# Patient Record
Sex: Female | Born: 1940 | Race: White | Hispanic: No
Health system: Midwestern US, Community
[De-identification: ages and names within clinical notes are randomized; demographics above are authoritative.]

## PROBLEM LIST (undated history)

## (undated) DIAGNOSIS — S83242A Other tear of medial meniscus, current injury, left knee, initial encounter: Secondary | ICD-10-CM

## (undated) DIAGNOSIS — C50919 Malignant neoplasm of unspecified site of unspecified female breast: Secondary | ICD-10-CM

## (undated) DIAGNOSIS — F419 Anxiety disorder, unspecified: Secondary | ICD-10-CM

## (undated) DIAGNOSIS — M8580 Other specified disorders of bone density and structure, unspecified site: Secondary | ICD-10-CM

## (undated) DIAGNOSIS — G4700 Insomnia, unspecified: Secondary | ICD-10-CM

## (undated) DIAGNOSIS — Z923 Personal history of irradiation: Secondary | ICD-10-CM

## (undated) DIAGNOSIS — E785 Hyperlipidemia, unspecified: Secondary | ICD-10-CM

## (undated) DIAGNOSIS — H269 Unspecified cataract: Secondary | ICD-10-CM

## (undated) DIAGNOSIS — I82409 Acute embolism and thrombosis of unspecified deep veins of unspecified lower extremity: Secondary | ICD-10-CM

## (undated) DIAGNOSIS — N3281 Overactive bladder: Secondary | ICD-10-CM

## (undated) DIAGNOSIS — B029 Zoster without complications: Secondary | ICD-10-CM

## (undated) DIAGNOSIS — K219 Gastro-esophageal reflux disease without esophagitis: Secondary | ICD-10-CM

## (undated) DIAGNOSIS — E079 Disorder of thyroid, unspecified: Secondary | ICD-10-CM

## (undated) HISTORY — PX: KNEE ARTHROSCOPY: SUR90

## (undated) HISTORY — DX: Insomnia, unspecified: G47.00

## (undated) HISTORY — DX: Gastro-esophageal reflux disease without esophagitis: K21.9

## (undated) HISTORY — DX: Unspecified cataract: H26.9

## (undated) HISTORY — DX: Anxiety disorder, unspecified: F41.9

## (undated) HISTORY — DX: Other specified disorders of bone density and structure, unspecified site: M85.80

## (undated) HISTORY — DX: Overactive bladder: N32.81

## (undated) HISTORY — DX: Zoster without complications: B02.9

## (undated) HISTORY — DX: Acute embolism and thrombosis of unspecified deep veins of unspecified lower extremity: I82.409

## (undated) HISTORY — DX: Disorder of thyroid, unspecified: E07.9

## (undated) HISTORY — DX: Malignant neoplasm of unspecified site of unspecified female breast: C50.919

## (undated) HISTORY — PX: CATARACT EXTRACTION W/ INTRAOCULAR LENS IMPLANT: SHX1309

## (undated) HISTORY — DX: Hyperlipidemia, unspecified: E78.5

## (undated) HISTORY — PX: LASIK: SHX215

---

## 1983-08-28 HISTORY — PX: REDUCTION MAMMAPLASTY: SUR839

## 2000-08-27 HISTORY — PX: VAGINAL HYSTERECTOMY: SUR661

## 2011-08-28 HISTORY — PX: BREAST LUMPECTOMY: SHX2

## 2012-06-27 DIAGNOSIS — C50919 Malignant neoplasm of unspecified site of unspecified female breast: Secondary | ICD-10-CM

## 2012-06-27 HISTORY — DX: Malignant neoplasm of unspecified site of unspecified female breast: C50.919

## 2012-08-27 HISTORY — PX: COLONOSCOPY: SHX174

## 2012-08-27 HISTORY — PX: BREAST LUMPECTOMY: SHX2

## 2012-08-27 LAB — HM COLONOSCOPY

## 2014-08-27 LAB — HM DEXA SCAN

## 2015-05-30 ENCOUNTER — Inpatient Hospital Stay: Admit: 2015-05-30 | Payer: MEDICARE | Primary: Internal Medicine

## 2015-05-30 NOTE — Other (Signed)
ORDERS NOT YET RECEIVED, Chart marked and placed in chart room for charge RN to follow up.

## 2015-05-30 NOTE — Other (Addendum)
Patient verified name, DOB, and surgery as listed in [facility].    TYPE  CASE:1b            Orders:NOT YET received, CALLED AND REQUESTED VIA VOICEMAIL FROM LINDA AT [facility] OFFICE.  Labs per surgeon:  NO ORDERS AT THIS TIME   Labs per anesthesia protocol: None needed   EKG  :  None needed     Pt medications obtained  PT MEMORY, med bottles NOT visualized. Requested pt to validate each medication, dose and frequency while here today.    Instructed patient to continue previous medications as prescribed prior to surgery and  to take the following medications the day of surgery according to anesthesia guidelines with a small sip of water : ATIVAN IF NEEDED, OMEPRAZOLE, TAMOXIFEN, TRAMADOL IF NEEDED        Continue all previous medications unless otherwise directed.     Instructed patient to hold  the following medications prior to surgery: ALL VITS, DICLOFENAC    Patient instructed on the following and verbalized understanding:  Arrive at [facility] entrance, time of arrival to be called the day before by 1700.  Responsible adult must drive patient to and from hospital, stay during surgery and 24 hours postoperatively.  Npo after midnight including gum, mints and ice chips.  Use hibiclens in the shower the night before and the morning of surgery.  Leave all valuables at home. Instructed on no jewelry or body piercings on the dos.  Bring insurance card and ID.  No perfumes, oil, powder, colognes, makeup or  lotions on the skin.    Patient verbalized understanding of all instructions and provided all medical/health information to the best of their ability.

## 2015-06-03 ENCOUNTER — Inpatient Hospital Stay: Payer: MEDICARE

## 2015-06-03 MED ORDER — ROPIVACAINE (PF) 5 MG/ML (0.5 %) INJECTION
5 mg/mL (0. %) | INTRAMUSCULAR | Status: AC
Start: 2015-06-03 — End: ?

## 2015-06-03 MED ORDER — SODIUM CHLORIDE 0.9 % INJECTION
INTRAMUSCULAR | Status: AC
Start: 2015-06-03 — End: ?

## 2015-06-03 MED ORDER — LACTATED RINGERS IV
INTRAVENOUS | Status: DC
Start: 2015-06-03 — End: 2015-06-03
  Administered 2015-06-03: 14:00:00 via INTRAVENOUS

## 2015-06-03 MED ORDER — PROPOFOL 10 MG/ML IV EMUL
10 mg/mL | INTRAVENOUS | Status: DC | PRN
Start: 2015-06-03 — End: 2015-06-03
  Administered 2015-06-03: 15:00:00 via INTRAVENOUS

## 2015-06-03 MED ORDER — DEXAMETHASONE SODIUM PHOSPHATE 4 MG/ML IJ SOLN
4 mg/mL | INTRAMUSCULAR | Status: AC
Start: 2015-06-03 — End: ?

## 2015-06-03 MED ORDER — ONDANSETRON (PF) 4 MG/2 ML INJECTION
4 mg/2 mL | INTRAMUSCULAR | Status: DC | PRN
Start: 2015-06-03 — End: 2015-06-03
  Administered 2015-06-03: 16:00:00 via INTRAVENOUS

## 2015-06-03 MED ORDER — EPHEDRINE SULFATE 50 MG/ML IJ SOLN
50 mg/mL | INTRAMUSCULAR | Status: AC
Start: 2015-06-03 — End: ?

## 2015-06-03 MED ORDER — LIDOCAINE HCL 1 % (10 MG/ML) IJ SOLN
10 mg/mL (1 %) | INTRAMUSCULAR | Status: DC | PRN
Start: 2015-06-03 — End: 2015-06-03

## 2015-06-03 MED ORDER — FENTANYL CITRATE (PF) 50 MCG/ML IJ SOLN
50 mcg/mL | INTRAMUSCULAR | Status: AC
Start: 2015-06-03 — End: ?

## 2015-06-03 MED ORDER — ONDANSETRON (PF) 4 MG/2 ML INJECTION
4 mg/2 mL | INTRAMUSCULAR | Status: AC
Start: 2015-06-03 — End: ?

## 2015-06-03 MED ORDER — LIDOCAINE (PF) 20 MG/ML (2 %) IJ SOLN
20 mg/mL (2 %) | INTRAMUSCULAR | Status: DC | PRN
Start: 2015-06-03 — End: 2015-06-03
  Administered 2015-06-03: 15:00:00 via INTRAVENOUS

## 2015-06-03 MED ORDER — FAMOTIDINE 20 MG TAB
20 mg | Freq: Once | ORAL | Status: DC
Start: 2015-06-03 — End: 2015-06-03

## 2015-06-03 MED ORDER — FENTANYL CITRATE (PF) 50 MCG/ML IJ SOLN
50 mcg/mL | INTRAMUSCULAR | Status: DC | PRN
Start: 2015-06-03 — End: 2015-06-03
  Administered 2015-06-03 (×3): via INTRAVENOUS

## 2015-06-03 MED ORDER — LIDOCAINE (PF) 20 MG/ML (2 %) IV SYRINGE
100 mg/5 mL (2 %) | INTRAVENOUS | Status: AC
Start: 2015-06-03 — End: ?

## 2015-06-03 MED ORDER — MIDAZOLAM 1 MG/ML IJ SOLN
1 mg/mL | Freq: Once | INTRAMUSCULAR | Status: DC | PRN
Start: 2015-06-03 — End: 2015-06-03

## 2015-06-03 MED ORDER — DEXAMETHASONE SODIUM PHOSPHATE 4 MG/ML IJ SOLN
4 mg/mL | INTRAMUSCULAR | Status: DC | PRN
Start: 2015-06-03 — End: 2015-06-03
  Administered 2015-06-03: 16:00:00 via INTRAVENOUS

## 2015-06-03 MED ORDER — ROPIVACAINE (PF) 5 MG/ML (0.5 %) INJECTION
5 mg/mL (0. %) | INTRAMUSCULAR | Status: DC | PRN
Start: 2015-06-03 — End: 2015-06-03
  Administered 2015-06-03: 16:00:00 via INTRA_ARTICULAR

## 2015-06-03 MED ORDER — PROPOFOL 10 MG/ML IV EMUL
10 mg/mL | INTRAVENOUS | Status: AC
Start: 2015-06-03 — End: ?

## 2015-06-03 MED FILL — FAMOTIDINE 20 MG TAB: 20 mg | ORAL | Qty: 1

## 2015-06-03 MED FILL — LIDOCAINE (PF) 20 MG/ML (2 %) IJ SOLN: 20 mg/mL (2 %) | INTRAMUSCULAR | Qty: 60

## 2015-06-03 MED FILL — PROPOFOL 10 MG/ML IV EMUL: 10 mg/mL | INTRAVENOUS | Qty: 200

## 2015-06-03 MED FILL — ONDANSETRON (PF) 4 MG/2 ML INJECTION: 4 mg/2 mL | INTRAMUSCULAR | Qty: 4

## 2015-06-03 MED FILL — EPHEDRINE SULFATE 50 MG/ML IJ SOLN: 50 mg/mL | INTRAMUSCULAR | Qty: 1

## 2015-06-03 MED FILL — DEXAMETHASONE SODIUM PHOSPHATE 4 MG/ML IJ SOLN: 4 mg/mL | INTRAMUSCULAR | Qty: 1

## 2015-06-03 MED FILL — SODIUM CHLORIDE 0.9 % INJECTION: INTRAMUSCULAR | Qty: 20

## 2015-06-03 MED FILL — PROPOFOL 10 MG/ML IV EMUL: 10 mg/mL | INTRAVENOUS | Qty: 20

## 2015-06-03 MED FILL — ONDANSETRON (PF) 4 MG/2 ML INJECTION: 4 mg/2 mL | INTRAMUSCULAR | Qty: 2

## 2015-06-03 MED FILL — NAROPIN (PF) 5 MG/ML (0.5 %) INJECTION SOLUTION: 5 mg/mL (0. %) | INTRAMUSCULAR | Qty: 30

## 2015-06-03 MED FILL — FENTANYL CITRATE (PF) 50 MCG/ML IJ SOLN: 50 mcg/mL | INTRAMUSCULAR | Qty: 2

## 2015-06-03 MED FILL — DEXAMETHASONE SODIUM PHOSPHATE 4 MG/ML IJ SOLN: 4 mg/mL | INTRAMUSCULAR | Qty: 4

## 2015-06-03 MED FILL — LIDOCAINE (PF) 20 MG/ML (2 %) IV SYRINGE: 100 mg/5 mL (2 %) | INTRAVENOUS | Qty: 5

## 2015-06-03 NOTE — Op Note (Signed)
[facility]   OPERATIVE REPORT       Name:  Charlene Hall, Charlene Hall   MR#:  [id number]   DOB:  11/23/1940   Account #:  [id number]   Date of Adm:  06/03/2015       PREOPERATIVE DIAGNOSIS: Left medial meniscus tear.    POSTOPERATIVE DIAGNOSES   1. Large tear of the posterior 1/2 of the medial meniscus.   2. Tear of the posterior 1/2 of the lateral meniscus.   3. 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   4. Thickened hypertrophic medial synovial plica.    OPERATION PERFORMED   1. Arthroscopic medial and lateral meniscectomy.   2. Excision of thickened hypertrophic medial synovial plica.    SURGEON: Brock Ra, MD    ANESTHESIA: General.    PROCEDURE: After an adequate level of general anesthesia was   obtained, the left leg was prepped and draped in the usual   sterile fashion. Anteromedial and anterolateral portals made.   Findings revealed normal tracking of the patella, but there was   some grade 2 chondromalacia of the undersurface of the patella.   She did not have a full-thickness lesion, but she did have a   thickened hypertrophic plica, and she had pain around her   patellofemoral joint. The plica was excised with the baskets and   the shaver, and a chondroplasty was performed on the   undersurface of the patella, just lesser changes on the femoral   sulcus. The ACL and the PCL were intact. The lateral compartment   articular surface looked good, but there was a tear involving   the posterior half, more of a horizontal cleavage component. The   tear was resected with the basket and the shaver. The main   problem was in the medial compartment, where the patient had a   large tear of the posterior 1/2 of the medial meniscus with a   displaced flap. The tear was resected with the baskets and the   shaver, and was left with a stable rim. There was some mild   chondromalacia medially and a chondroplasty was performed. The   knee was then copiously irrigated. Sterile dressings  applied.        Brock Ra, MD      JRV / LE   D:  06/03/2015   11:50   T:  06/03/2015   12:12   Job #:  [id number]

## 2015-06-03 NOTE — Anesthesia Pre-Procedure Evaluation (Signed)
Anesthetic History   No history of anesthetic complications            Review of Systems / Medical History  Patient summary reviewed, nursing notes reviewed and pertinent labs reviewed    Pulmonary  Within defined limits                 Neuro/Psych   Within defined limits           Cardiovascular                  Exercise tolerance: >4 METS     GI/Hepatic/Renal     GERD: well controlled           Endo/Other  Within defined limits           Other Findings              Physical Exam    Airway  Mallampati: II  TM Distance: 4 - 6 cm  Neck ROM: normal range of motion   Mouth opening: Normal     Cardiovascular    Rhythm: regular           Dental  No notable dental hx       Pulmonary  Breath sounds clear to auscultation               Abdominal         Other Findings            Anesthetic Plan    ASA: 1  Anesthesia type: general          Induction: Intravenous  Anesthetic plan and risks discussed with: Patient

## 2015-06-03 NOTE — Brief Op Note (Signed)
BRIEF OPERATIVE NOTE    Date of Procedure: 06/03/2015   Preoperative Diagnosis: Other tear of lateral meniscus, current injury, left knee, initial encounter [S83.282A]  Postoperative Diagnosis: LEFT MEDIAL AND LATERAL MENISCUS TEARS    Procedure(s):  LEFT KNEE ARTHROSCOPY WITH LATERAL AND MEDIAL MENISCECTOMY  Surgeon(s) and Role:     * Brock Ra, MD - Primary            Surgical Staff:  Circ-1: Inocente Salles, RN  Scrub Tech-1: Kathrin Greathouse  Event Time In   Incision Start 1133   Incision Close 1145     Anesthesia: General   Estimated Blood Loss:     Specimens: * No specimens in log *   Findings:      Complications:       Implants: * No implants in log *

## 2015-06-03 NOTE — H&P (Signed)
Outpatient Surgery History and Physical:  Charlene Hall was seen and examined.    CHIEF COMPLAINT:    l knee .     PE:   There were no vitals taken for this visit.    Heart:   Regular rhythm      Lungs:  Are clear      Past Medical History:  There are no active problems to display for this patient.      Surgical History:   Past Surgical History   Procedure Laterality Date   ??? Pr breast surgery procedure unlisted       right lumpectomy, cancer   ??? Hx orthopaedic Right      knee scope   ??? Hx hysterectomy         Social History: Patient  reports that she has quit smoking. She has never used smokeless tobacco. She reports that she drinks about 3.6 oz of alcohol per week     Family History:   Family History   Problem Relation Age of Onset   ??? Cancer Mother      lung   ??? Cancer Father      colon       Allergies: Reviewed per EMR  Allergies   Allergen Reactions   ??? Nsaids (Non-Steroidal Anti-Inflammatory Drug) Nausea and Vomiting   ??? Sporanox [Itraconazole] Hives   ??? Sulfa (Sulfonamide Antibiotics) Nausea and Vomiting       Medications:    No current facility-administered medications on file prior to encounter.      No current outpatient prescriptions on file prior to encounter.       The surgery is planned for the .        History and physical has been reviewed. The patient has been examined. There have been no significant clinical changes since the completion of the originally dated History and Physical.  Patient identified by surgeon; surgical site was confirmed by patient and surgeon.      The patient is here today for outpatient surgery. I have examined the patient, no changes are noted in the patient's medical status. Necessity for the procedure/care is still present and the history and physical above is current.  See the office notes for the full long term history of the problem.  Please see the recent office notes for the musculoskeletal examination.    Signed By: Brock Ra, MD     June 03, 2015 6:56 AM        Outpatient Surgery History and Physical:  Charlene Hall was seen and examined.    CHIEF COMPLAINT:   Knee .     PE:   There were no vitals taken for this visit.    Heart:   Regular rhythm      Lungs:  Are clear      Past Medical History:  There are no active problems to display for this patient.      Surgical History:   Past Surgical History   Procedure Laterality Date   ??? Pr breast surgery procedure unlisted       right lumpectomy, cancer   ??? Hx orthopaedic Right      knee scope   ??? Hx hysterectomy         Social History: Patient  reports that she has quit smoking. She has never used smokeless tobacco. She reports that she drinks about 3.6 oz of alcohol per week     Family History:   Family History  Problem Relation Age of Onset   ??? Cancer Mother      lung   ??? Cancer Father      colon       Allergies: Reviewed per EMR  Allergies   Allergen Reactions   ??? Nsaids (Non-Steroidal Anti-Inflammatory Drug) Nausea and Vomiting   ??? Sporanox [Itraconazole] Hives   ??? Sulfa (Sulfonamide Antibiotics) Nausea and Vomiting       Medications:    No current facility-administered medications on file prior to encounter.      No current outpatient prescriptions on file prior to encounter.       The surgery is planned for the .        History and physical has been reviewed. The patient has been examined. There have been no significant clinical changes since the completion of the originally dated History and Physical.  Patient identified by surgeon; surgical site was confirmed by patient and surgeon.      The patient is here today for outpatient surgery. I have examined the patient, no changes are noted in the patient's medical status. Necessity for the procedure/care is still present and the history and physical above is current.  See the office notes for the full long term history of the problem.  Please see the recent office notes for the musculoskeletal examination.    Signed By: Brock Ra, MD     June 03, 2015 6:56 AM        Outpatient Surgery History and Physical:  Charlene Hall was seen and examined.    CHIEF COMPLAINT:  Knee .     PE:   There were no vitals taken for this visit.    Heart:   Regular rhythm      Lungs:  Are clear      Past Medical History:  There are no active problems to display for this patient.      Surgical History:   Past Surgical History   Procedure Laterality Date   ??? Pr breast surgery procedure unlisted       right lumpectomy, cancer   ??? Hx orthopaedic Right      knee scope   ??? Hx hysterectomy         Social History: Patient  reports that she has quit smoking. She has never used smokeless tobacco. She reports that she drinks about 3.6 oz of alcohol per week     Family History:   Family History   Problem Relation Age of Onset   ??? Cancer Mother      lung   ??? Cancer Father      colon       Allergies: Reviewed per EMR  Allergies   Allergen Reactions   ??? Nsaids (Non-Steroidal Anti-Inflammatory Drug) Nausea and Vomiting   ??? Sporanox [Itraconazole] Hives   ??? Sulfa (Sulfonamide Antibiotics) Nausea and Vomiting       Medications:    No current facility-administered medications on file prior to encounter.      No current outpatient prescriptions on file prior to encounter.       The surgery is planned for the    .        History and physical has been reviewed. The patient has been examined. There have been no significant clinical changes since the completion of the originally dated History and Physical.  Patient identified by surgeon; surgical site was confirmed by patient and surgeon.      The patient is here today  for outpatient surgery. I have examined the patient, no changes are noted in the patient's medical status. Necessity for the procedure/care is still present and the history and physical above is current.  See the office notes for the full long term history of the problem.  Please see the recent office notes for the musculoskeletal examination.    Signed By: Brock Ra, MD     June 03, 2015 6:56 AM

## 2015-06-03 NOTE — Anesthesia Post-Procedure Evaluation (Signed)
Post-Anesthesia Evaluation and Assessment    Patient: Charlene Hall MRN: [id number]  SSN: [id number]    Date of Birth: 08/23/1941  Age: 74 y.o.  Sex: female       Cardiovascular Function/Vital Signs  Visit Vitals   ??? BP 131/66   ??? Pulse 66   ??? Temp 36.5 ??C (97.7 ??F)   ??? Resp 12   ??? Ht  (1.626 m)   ??? Wt 76.7 kg (169 lb)   ??? SpO2 99%   ??? BMI 29.01 kg/m2       Patient is status post general anesthesia for Procedure(s):  LEFT KNEE ARTHROSCOPY WITH LATERAL AND MEDIAL MENISCECTOMY.    Nausea/Vomiting: None    Postoperative hydration reviewed and adequate.    Pain:  Pain Scale 1: Numeric (0 - 10) (06/03/15 1158)  Pain Intensity 1: 0 (06/03/15 1158)   Managed    Neurological Status:   Neuro (WDL): Within Defined Limits (06/03/15 1158)   At baseline    Mental Status and Level of Consciousness: Arousable    Pulmonary Status:   O2 Device: Room air (06/03/15 1158)   Adequate oxygenation and airway patent    Complications related to anesthesia: None    Post-anesthesia assessment completed. No concerns    Signed By: Hal Neer, MD     June 03, 2015

## 2015-06-03 NOTE — Op Note (Signed)
[facility]   OPERATIVE REPORT       Name:  Charlene Hall, Charlene Hall   MR#:  [id number]   DOB:  [birth year]   Account #:  [id number]   Date of Adm:  06/03/2015       PREOPERATIVE DIAGNOSIS: Left medial meniscus tear.    POSTOPERATIVE DIAGNOSES   1. Large tear of the posterior 1/2 of the medial meniscus.   2. Tear of the posterior 1/2 of the lateral meniscus.   3. 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   4. Thickened hypertrophic medial synovial plica.    OPERATION PERFORMED   1. Arthroscopic medial and lateral meniscectomy.   2. Excision of thickened hypertrophic medial synovial plica.    SURGEON: Brock Ra, MD    ANESTHESIA: General.    PROCEDURE: After an adequate level of general anesthesia was   obtained, the left leg was prepped and draped in the usual   sterile fashion. Anteromedial and anterolateral portals made.   Findings revealed normal tracking of the patella, but there was   some grade 2 chondromalacia of the undersurface of the patella.   She did not have a full-thickness lesion, but she did have a   thickened hypertrophic plica, and she had pain around her   patellofemoral joint. The plica was excised with the baskets and   the shaver, and a chondroplasty was performed on the   undersurface of the patella, just lesser changes on the femoral   sulcus. The ACL and the PCL were intact. The lateral compartment   articular surface looked good, but there was a tear involving   the posterior half, more of a horizontal cleavage component. The   tear was resected with the basket and the shaver. The main   problem was in the medial compartment, where the patient had a   large tear of the posterior 1/2 of the medial meniscus with a   displaced flap. The tear was resected with the baskets and the   shaver, and was left with a stable rim. There was some mild   chondromalacia medially and a chondroplasty was performed. The   knee was then copiously irrigated. Sterile dressings applied.         Brock Ra, MD      JRV / LE   D:  06/03/2015   11:50   T:  06/03/2015   12:12   Job #:  [id number]

## 2015-06-28 DIAGNOSIS — I82409 Acute embolism and thrombosis of unspecified deep veins of unspecified lower extremity: Secondary | ICD-10-CM | POA: Insufficient documentation

## 2015-06-28 HISTORY — DX: Acute embolism and thrombosis of unspecified deep veins of unspecified lower extremity: I82.409

## 2015-08-28 LAB — HM MAMMOGRAPHY: HM Mammogram: NORMAL (ref 0–4)

## 2015-08-29 DIAGNOSIS — M4184 Other forms of scoliosis, thoracic region: Secondary | ICD-10-CM | POA: Diagnosis not present

## 2015-08-29 DIAGNOSIS — R0600 Dyspnea, unspecified: Secondary | ICD-10-CM | POA: Diagnosis not present

## 2015-08-29 DIAGNOSIS — R002 Palpitations: Secondary | ICD-10-CM | POA: Diagnosis not present

## 2015-08-29 DIAGNOSIS — Z87891 Personal history of nicotine dependence: Secondary | ICD-10-CM | POA: Diagnosis not present

## 2015-08-30 DIAGNOSIS — R002 Palpitations: Secondary | ICD-10-CM | POA: Diagnosis not present

## 2015-08-30 DIAGNOSIS — I82A29 Chronic embolism and thrombosis of unspecified axillary vein: Secondary | ICD-10-CM | POA: Diagnosis not present

## 2015-08-30 DIAGNOSIS — C50911 Malignant neoplasm of unspecified site of right female breast: Secondary | ICD-10-CM | POA: Diagnosis not present

## 2015-09-05 DIAGNOSIS — R928 Other abnormal and inconclusive findings on diagnostic imaging of breast: Secondary | ICD-10-CM | POA: Diagnosis not present

## 2015-09-06 DIAGNOSIS — C50911 Malignant neoplasm of unspecified site of right female breast: Secondary | ICD-10-CM | POA: Diagnosis not present

## 2015-10-24 DIAGNOSIS — K219 Gastro-esophageal reflux disease without esophagitis: Secondary | ICD-10-CM | POA: Diagnosis not present

## 2015-10-24 DIAGNOSIS — E785 Hyperlipidemia, unspecified: Secondary | ICD-10-CM | POA: Diagnosis not present

## 2015-10-24 DIAGNOSIS — C50911 Malignant neoplasm of unspecified site of right female breast: Secondary | ICD-10-CM | POA: Diagnosis not present

## 2015-10-24 DIAGNOSIS — I82439 Acute embolism and thrombosis of unspecified popliteal vein: Secondary | ICD-10-CM | POA: Diagnosis not present

## 2015-10-24 DIAGNOSIS — N3281 Overactive bladder: Secondary | ICD-10-CM | POA: Diagnosis not present

## 2015-10-24 DIAGNOSIS — Z0000 Encounter for general adult medical examination without abnormal findings: Secondary | ICD-10-CM | POA: Diagnosis not present

## 2015-10-24 DIAGNOSIS — E559 Vitamin D deficiency, unspecified: Secondary | ICD-10-CM | POA: Diagnosis not present

## 2015-10-24 LAB — LIPID PANEL
Cholesterol: 185 mg/dL (ref 0–200)
HDL: 67 mg/dL (ref 35–70)
LDL CALC: 126 mg/dL
Triglycerides: 61 mg/dL (ref 40–160)

## 2015-11-08 DIAGNOSIS — R899 Unspecified abnormal finding in specimens from other organs, systems and tissues: Secondary | ICD-10-CM | POA: Diagnosis not present

## 2015-11-08 LAB — CBC AND DIFFERENTIAL
HCT: 37 % (ref 36–46)
Hemoglobin: 12.2 g/dL (ref 12.0–16.0)
Platelets: 189 10*3/uL (ref 150–399)
WBC: 4.3 10^3/mL

## 2015-11-11 LAB — BASIC METABOLIC PANEL
BUN: 20 mg/dL (ref 4–21)
Creatinine: 0.8 mg/dL (ref 0.5–1.1)
GLUCOSE: 88 mg/dL
POTASSIUM: 4.3 mmol/L (ref 3.4–5.3)
SODIUM: 139 mmol/L (ref 137–147)

## 2015-11-11 LAB — HEPATIC FUNCTION PANEL
ALT: 16 U/L (ref 7–35)
AST: 13 U/L (ref 13–35)
Alkaline Phosphatase: 41 U/L (ref 25–125)
BILIRUBIN, TOTAL: 0.3 mg/dL

## 2015-12-07 DIAGNOSIS — S83242A Other tear of medial meniscus, current injury, left knee, initial encounter: Secondary | ICD-10-CM | POA: Diagnosis not present

## 2015-12-07 DIAGNOSIS — M1712 Unilateral primary osteoarthritis, left knee: Secondary | ICD-10-CM | POA: Diagnosis not present

## 2015-12-19 DIAGNOSIS — M238X2 Other internal derangements of left knee: Secondary | ICD-10-CM | POA: Diagnosis not present

## 2015-12-19 DIAGNOSIS — M25562 Pain in left knee: Secondary | ICD-10-CM | POA: Diagnosis not present

## 2015-12-19 DIAGNOSIS — Z4789 Encounter for other orthopedic aftercare: Secondary | ICD-10-CM | POA: Diagnosis not present

## 2015-12-21 DIAGNOSIS — M238X2 Other internal derangements of left knee: Secondary | ICD-10-CM | POA: Diagnosis not present

## 2015-12-21 DIAGNOSIS — Z4789 Encounter for other orthopedic aftercare: Secondary | ICD-10-CM | POA: Diagnosis not present

## 2015-12-21 DIAGNOSIS — M25562 Pain in left knee: Secondary | ICD-10-CM | POA: Diagnosis not present

## 2015-12-27 DIAGNOSIS — H52223 Regular astigmatism, bilateral: Secondary | ICD-10-CM | POA: Diagnosis not present

## 2015-12-27 DIAGNOSIS — H04123 Dry eye syndrome of bilateral lacrimal glands: Secondary | ICD-10-CM | POA: Diagnosis not present

## 2015-12-27 DIAGNOSIS — D2311 Other benign neoplasm of skin of right eyelid, including canthus: Secondary | ICD-10-CM | POA: Diagnosis not present

## 2015-12-30 DIAGNOSIS — Z4789 Encounter for other orthopedic aftercare: Secondary | ICD-10-CM | POA: Diagnosis not present

## 2015-12-30 DIAGNOSIS — M238X2 Other internal derangements of left knee: Secondary | ICD-10-CM | POA: Diagnosis not present

## 2015-12-30 DIAGNOSIS — M25562 Pain in left knee: Secondary | ICD-10-CM | POA: Diagnosis not present

## 2016-01-02 DIAGNOSIS — M25562 Pain in left knee: Secondary | ICD-10-CM | POA: Diagnosis not present

## 2016-01-02 DIAGNOSIS — Z4789 Encounter for other orthopedic aftercare: Secondary | ICD-10-CM | POA: Diagnosis not present

## 2016-01-02 DIAGNOSIS — C50911 Malignant neoplasm of unspecified site of right female breast: Secondary | ICD-10-CM | POA: Diagnosis not present

## 2016-01-02 DIAGNOSIS — M238X2 Other internal derangements of left knee: Secondary | ICD-10-CM | POA: Diagnosis not present

## 2016-01-05 DIAGNOSIS — Z4789 Encounter for other orthopedic aftercare: Secondary | ICD-10-CM | POA: Diagnosis not present

## 2016-01-05 DIAGNOSIS — M25562 Pain in left knee: Secondary | ICD-10-CM | POA: Diagnosis not present

## 2016-01-05 DIAGNOSIS — M238X2 Other internal derangements of left knee: Secondary | ICD-10-CM | POA: Diagnosis not present

## 2016-01-11 DIAGNOSIS — Z4789 Encounter for other orthopedic aftercare: Secondary | ICD-10-CM | POA: Diagnosis not present

## 2016-01-11 DIAGNOSIS — M25562 Pain in left knee: Secondary | ICD-10-CM | POA: Diagnosis not present

## 2016-01-11 DIAGNOSIS — M238X2 Other internal derangements of left knee: Secondary | ICD-10-CM | POA: Diagnosis not present

## 2016-01-17 DIAGNOSIS — R208 Other disturbances of skin sensation: Secondary | ICD-10-CM | POA: Diagnosis not present

## 2016-01-17 DIAGNOSIS — R238 Other skin changes: Secondary | ICD-10-CM | POA: Diagnosis not present

## 2016-01-17 DIAGNOSIS — L720 Epidermal cyst: Secondary | ICD-10-CM | POA: Diagnosis not present

## 2016-01-17 DIAGNOSIS — D485 Neoplasm of uncertain behavior of skin: Secondary | ICD-10-CM | POA: Diagnosis not present

## 2016-01-24 DIAGNOSIS — F419 Anxiety disorder, unspecified: Secondary | ICD-10-CM | POA: Diagnosis not present

## 2016-01-24 DIAGNOSIS — M79604 Pain in right leg: Secondary | ICD-10-CM | POA: Diagnosis not present

## 2016-01-25 DIAGNOSIS — M79604 Pain in right leg: Secondary | ICD-10-CM | POA: Diagnosis not present

## 2016-03-04 DIAGNOSIS — S99921A Unspecified injury of right foot, initial encounter: Secondary | ICD-10-CM | POA: Diagnosis not present

## 2016-03-04 DIAGNOSIS — S90121A Contusion of right lesser toe(s) without damage to nail, initial encounter: Secondary | ICD-10-CM | POA: Diagnosis not present

## 2016-03-04 DIAGNOSIS — M79671 Pain in right foot: Secondary | ICD-10-CM | POA: Diagnosis not present

## 2016-04-25 DIAGNOSIS — J209 Acute bronchitis, unspecified: Secondary | ICD-10-CM | POA: Diagnosis not present

## 2016-04-25 DIAGNOSIS — R05 Cough: Secondary | ICD-10-CM | POA: Diagnosis not present

## 2016-04-25 DIAGNOSIS — R0602 Shortness of breath: Secondary | ICD-10-CM | POA: Diagnosis not present

## 2016-04-25 DIAGNOSIS — R0989 Other specified symptoms and signs involving the circulatory and respiratory systems: Secondary | ICD-10-CM | POA: Diagnosis not present

## 2016-04-30 DIAGNOSIS — M79675 Pain in left toe(s): Secondary | ICD-10-CM | POA: Diagnosis not present

## 2016-04-30 DIAGNOSIS — S92502A Displaced unspecified fracture of left lesser toe(s), initial encounter for closed fracture: Secondary | ICD-10-CM | POA: Diagnosis not present

## 2016-04-30 DIAGNOSIS — S99922A Unspecified injury of left foot, initial encounter: Secondary | ICD-10-CM | POA: Diagnosis not present

## 2016-04-30 DIAGNOSIS — M79672 Pain in left foot: Secondary | ICD-10-CM | POA: Diagnosis not present

## 2016-05-07 DIAGNOSIS — C50911 Malignant neoplasm of unspecified site of right female breast: Secondary | ICD-10-CM | POA: Diagnosis not present

## 2016-05-07 DIAGNOSIS — F419 Anxiety disorder, unspecified: Secondary | ICD-10-CM | POA: Diagnosis not present

## 2016-05-18 DIAGNOSIS — H16223 Keratoconjunctivitis sicca, not specified as Sjogren's, bilateral: Secondary | ICD-10-CM | POA: Diagnosis not present

## 2016-05-18 DIAGNOSIS — H11441 Conjunctival cysts, right eye: Secondary | ICD-10-CM | POA: Diagnosis not present

## 2016-05-28 DIAGNOSIS — Z23 Encounter for immunization: Secondary | ICD-10-CM | POA: Diagnosis not present

## 2016-05-28 DIAGNOSIS — G4700 Insomnia, unspecified: Secondary | ICD-10-CM | POA: Diagnosis not present

## 2016-05-28 DIAGNOSIS — N3281 Overactive bladder: Secondary | ICD-10-CM | POA: Diagnosis not present

## 2016-05-28 DIAGNOSIS — F419 Anxiety disorder, unspecified: Secondary | ICD-10-CM | POA: Diagnosis not present

## 2016-06-27 DIAGNOSIS — D1801 Hemangioma of skin and subcutaneous tissue: Secondary | ICD-10-CM | POA: Diagnosis not present

## 2016-06-27 DIAGNOSIS — L814 Other melanin hyperpigmentation: Secondary | ICD-10-CM | POA: Diagnosis not present

## 2016-06-27 DIAGNOSIS — L570 Actinic keratosis: Secondary | ICD-10-CM | POA: Diagnosis not present

## 2016-06-27 DIAGNOSIS — B029 Zoster without complications: Secondary | ICD-10-CM | POA: Insufficient documentation

## 2016-06-27 DIAGNOSIS — D2371 Other benign neoplasm of skin of right lower limb, including hip: Secondary | ICD-10-CM | POA: Diagnosis not present

## 2016-06-27 DIAGNOSIS — L821 Other seborrheic keratosis: Secondary | ICD-10-CM | POA: Diagnosis not present

## 2016-06-27 HISTORY — DX: Zoster without complications: B02.9

## 2016-06-30 DIAGNOSIS — B029 Zoster without complications: Secondary | ICD-10-CM | POA: Diagnosis not present

## 2016-06-30 DIAGNOSIS — R21 Rash and other nonspecific skin eruption: Secondary | ICD-10-CM | POA: Diagnosis not present

## 2016-07-02 DIAGNOSIS — B029 Zoster without complications: Secondary | ICD-10-CM | POA: Diagnosis not present

## 2016-07-03 DIAGNOSIS — B0231 Zoster conjunctivitis: Secondary | ICD-10-CM | POA: Diagnosis not present

## 2016-07-09 DIAGNOSIS — K219 Gastro-esophageal reflux disease without esophagitis: Secondary | ICD-10-CM | POA: Diagnosis not present

## 2016-07-09 DIAGNOSIS — F419 Anxiety disorder, unspecified: Secondary | ICD-10-CM | POA: Diagnosis not present

## 2016-08-01 DIAGNOSIS — N390 Urinary tract infection, site not specified: Secondary | ICD-10-CM | POA: Diagnosis not present

## 2016-08-01 DIAGNOSIS — R300 Dysuria: Secondary | ICD-10-CM | POA: Diagnosis not present

## 2016-08-31 DIAGNOSIS — L570 Actinic keratosis: Secondary | ICD-10-CM | POA: Diagnosis not present

## 2016-08-31 DIAGNOSIS — Z85820 Personal history of malignant melanoma of skin: Secondary | ICD-10-CM | POA: Diagnosis not present

## 2016-08-31 DIAGNOSIS — Z8619 Personal history of other infectious and parasitic diseases: Secondary | ICD-10-CM | POA: Diagnosis not present

## 2016-08-31 DIAGNOSIS — Z23 Encounter for immunization: Secondary | ICD-10-CM | POA: Diagnosis not present

## 2016-09-10 DIAGNOSIS — R928 Other abnormal and inconclusive findings on diagnostic imaging of breast: Secondary | ICD-10-CM | POA: Diagnosis not present

## 2016-10-31 ENCOUNTER — Encounter: Payer: Self-pay | Admitting: Internal Medicine

## 2016-11-01 ENCOUNTER — Ambulatory Visit (INDEPENDENT_AMBULATORY_CARE_PROVIDER_SITE_OTHER): Payer: Medicare Other | Admitting: Internal Medicine

## 2016-11-01 ENCOUNTER — Encounter: Payer: Self-pay | Admitting: Internal Medicine

## 2016-11-01 VITALS — BP 110/70 | HR 72 | Temp 98.1°F | Ht 64.75 in | Wt 177.0 lb

## 2016-11-01 DIAGNOSIS — Z853 Personal history of malignant neoplasm of breast: Secondary | ICD-10-CM | POA: Diagnosis not present

## 2016-11-01 DIAGNOSIS — K219 Gastro-esophageal reflux disease without esophagitis: Secondary | ICD-10-CM | POA: Diagnosis not present

## 2016-11-01 DIAGNOSIS — Z860100 Personal history of colon polyps, unspecified: Secondary | ICD-10-CM

## 2016-11-01 DIAGNOSIS — R635 Abnormal weight gain: Secondary | ICD-10-CM | POA: Diagnosis not present

## 2016-11-01 DIAGNOSIS — R0982 Postnasal drip: Secondary | ICD-10-CM | POA: Diagnosis not present

## 2016-11-01 DIAGNOSIS — N3941 Urge incontinence: Secondary | ICD-10-CM | POA: Diagnosis not present

## 2016-11-01 DIAGNOSIS — Z8619 Personal history of other infectious and parasitic diseases: Secondary | ICD-10-CM

## 2016-11-01 DIAGNOSIS — F5101 Primary insomnia: Secondary | ICD-10-CM | POA: Diagnosis not present

## 2016-11-01 DIAGNOSIS — Z86718 Personal history of other venous thrombosis and embolism: Secondary | ICD-10-CM | POA: Diagnosis not present

## 2016-11-01 LAB — COMPLETE METABOLIC PANEL WITH GFR
ALT: 16 U/L (ref 6–29)
AST: 13 U/L (ref 10–35)
Albumin: 4.3 g/dL (ref 3.6–5.1)
Alkaline Phosphatase: 42 U/L (ref 33–130)
BUN: 21 mg/dL (ref 7–25)
CO2: 27 mmol/L (ref 20–31)
Calcium: 9.5 mg/dL (ref 8.6–10.4)
Chloride: 104 mmol/L (ref 98–110)
Creat: 0.77 mg/dL (ref 0.60–0.93)
GFR, Est African American: 87 mL/min (ref >=60)
GFR, Est Non African American: 75 mL/min (ref >=60)
Glucose, Bld: 88 mg/dL (ref 65–99)
Potassium: 4.8 mmol/L (ref 3.5–5.3)
Sodium: 138 mmol/L (ref 135–146)
Total Bilirubin: 0.4 mg/dL (ref 0.2–1.2)
Total Protein: 6.8 g/dL (ref 6.1–8.1)

## 2016-11-01 LAB — TSH: TSH: 2.95 mIU/L

## 2016-11-01 LAB — CBC WITH DIFFERENTIAL/PLATELET
Basophils Absolute: 0 cells/uL (ref 0–200)
Basophils Relative: 0 %
Eosinophils Absolute: 36 cells/uL (ref 15–500)
Eosinophils Relative: 1 %
HCT: 40.2 % (ref 35.0–45.0)
Hemoglobin: 13.4 g/dL (ref 11.7–15.5)
Lymphocytes Relative: 35 %
Lymphs Abs: 1260 cells/uL (ref 850–3900)
MCH: 31.6 pg (ref 27.0–33.0)
MCHC: 33.3 g/dL (ref 32.0–36.0)
MCV: 94.8 fL (ref 80.0–100.0)
MPV: 9.4 fL (ref 7.5–12.5)
Monocytes Absolute: 108 cells/uL — ABNORMAL LOW (ref 200–950)
Monocytes Relative: 3 %
Neutro Abs: 2196 cells/uL (ref 1500–7800)
Neutrophils Relative %: 61 %
Platelets: 234 10*3/uL (ref 140–400)
RBC: 4.24 MIL/uL (ref 3.80–5.10)
RDW: 13.6 % (ref 11.0–15.0)
WBC: 3.6 10*3/uL — ABNORMAL LOW (ref 3.8–10.8)

## 2016-11-01 MED ORDER — SOLIFENACIN SUCCINATE 5 MG PO TABS: 5.0000 mg | ORAL_TABLET | Freq: Every day | ORAL | 5 refills | Status: DC

## 2016-11-01 MED ORDER — OMEPRAZOLE 40 MG PO CPDR: 40.0000 mg | DELAYED_RELEASE_CAPSULE | Freq: Every day | ORAL | 3 refills | Status: DC

## 2016-11-01 MED ORDER — SOLIFENACIN SUCCINATE 5 MG PO TABS: 5.0000 mg | ORAL_TABLET | Freq: Every day | ORAL | 3 refills | Status: DC

## 2016-11-01 MED ORDER — FLUTICASONE PROPIONATE 50 MCG/ACT NA SUSP: 2.0000 | Freq: Every day | NASAL | 5 refills | Status: DC

## 2016-11-01 NOTE — Progress Notes (Signed)
Provider:  Rexene Edison. Mariea Clonts, D.O., C.M.D. Location:   [facility]  Place of Service:   clinic  Previous PCP: Hollace Kinnier, DO Patient Care Team: Gayland Curry, DO as PCP - General (Geriatric Medicine)  Extended Emergency Contact Information Primary Emergency Contact: Greer Ee [place] Phone: [phone] Relation: Daughter  Code Status: DNR Goals of Care: Advanced Directive information Advanced Directives 11/01/2016  Does Patient Have a Medical Advance Directive? Yes  Type of Paramedic of [place];Living will  Does patient want to make changes to medical advance directive? -  Copy of [place] in Chart? No - copy requested  Pre-existing out of facility DNR order (yellow form or pink MOST form) -    Chief Complaint  Patient presents with  . Establish Care    new patient    HPI: Patient is a 76 y.o. female seen today to establish with [facility]. We did not get the packet until yesterday and pt's advance directives and DNR copies were not included.  She reports having all of these documents.  The request for records form has not yet been done.  She has moved to [place] from [place], [place] in December.  She lives in a [place] by herself.  She was widowed in 2014.  Her daughter lives in [place] and her son lives in [place].    She plays golf and is very active.  Nov of 2016 she drove herself to the hospital with her DVT.  She arrived at 4:30-5am and [place] tech didn't arrive until 7am.  She was started on eliquis which she took for 6 mos.  Had f/u US which was negative and DVT (one and only ) was felt to be due to tamoxifen.  Came off of it at the time of her DVT.  Her original oncologist in [place] left the practice and the new physician decided she was doing fine being almost 5 years out so she's been off.  She did return to [place] in Jan and had her diagnostic mammo which was  negative.  She had initially had a right lumpectomy and radiation therapy.    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  She still has her ovaries.    For the past 1 or 2 years, she's had a nasty postnasal drip.  She had gone to get a crown replaced and thought she had a sensitive tooth next to it thinking she needed root canal.  She was found to have a sinus infection with a panoramic.  She had a 10 day course of amoxicillin which helped the tooth but not the postnasal drip.  Takes omeprazole for GERD.  On it since 2015.  Takes the fexofenadine daily.  Years ago she did have allergy testing due to a bee sting reaction.  No obvious allergies then.    She'd like to get off of ambien, but she does sleep if she comes off of it.  Tried taking 1/2 pill instead.  Took the ambien at 9:30pm and she as still awake functioning at 11:30pm.  Would like to be in bed and asleep at 11:30pm.  Cold sleep till 10am and she'd like to be getting up earlier.  Often watches tv and ipad until 1 hr before sleep time intended.  Also tried [place] which she did not tolerate.  She felt like she was hungover from that one.  Has trouble shutting things off.  She also reports having gained a lot  of weight (a good 24 lbs) that she cannot get off.  Has done weight watchers and several other programs, but keeps falling off the pedestal.  She has come down from a 200 lb high.  She's afraid she's headed that way.  Says she's had a time of being very fit and eating healthily.  She does stress eat.    She takes detrol LA for urgency.  She can stand up and suddenly have to go immediately.  She was on vesicare at one time.  She does think vesicare is a little better.  She gets detrol from [place].  She does not have frequency.    Her 53 yo grandson committed suicide a few years back and she was a mess.  She was on lorazepam to calm her down which did help her a lot from the anxiety perspective.  It did make her sleepy.  She did not want to  be taking lorazepam and ambien so she stopped the lorazepam.  Then she'd had the major move from [facility] to [facility] and her husband passed away two mos in and she'd had her breast cancer diagnosis 2 mos before the move.  IN November, she noted stress from another pending move and changes.  Lexapro practically knocked her out so she was falling asleep in the day.  She had been on 1/2 a pill daily.  She stopped it also.    Did have shingles on her right temple area in 06/2016.  She did have polyps on her cscope in 2014.  Due in 2019.    She smoked in the past.   Past Medical History:  Diagnosis Date  . Breast cancer ([place]) 06/27/2012   Right  . DVT (deep venous thrombosis) ([place]) 06/28/2015  . Shingles 06/2016   Past Surgical History:  Procedure Laterality Date  . BREAST LUMPECTOMY Right 2014  . CATARACT EXTRACTION W/ INTRAOCULAR LENS IMPLANT Bilateral   . COLONOSCOPY  08/27/2012   [place]  . LASIK Bilateral   . VAGINAL HYSTERECTOMY  2002    Social History   Social History  . Marital status: Widowed    Spouse name: N/A  . Number of children: N/A  . Years of education: N/A   Social History Main Topics  . Smoking status: Former Smoker    Years: 10.00    Types: Cigarettes  . Smokeless tobacco: Never Used  . Alcohol use Yes     Comment: 7 drinks per week   . Drug use: Unknown  . Sexual activity: Not Asked   Other Topics Concern  . None   Social History Narrative   Diet: General       Caffeine: Yes      Married, if yes what year: Widow, 1961/1988      Do you live in a house, apartment, assisted living, condo, trailer, ect: House @ [facility]      Pets: No      Current/Past profession: Garment/textile technologist      Exercise: Yes         Living Will: Yes   DNR: Yes   POA/HPOA: Yes      Functional Status:   Do you have difficulty bathing or dressing yourself?   Do you have difficulty preparing food or eating?   Do you have difficulty managing your medications?   Do you have difficulty  managing your finances?   Do you have difficulty affording your medications?       reports that she has quit  smoking. Her smoking use included Cigarettes. She quit after 10.00 years of use. She has never used smokeless tobacco. She reports that she drinks alcohol. Her drug history is not on file.  Functional Status Survey:    Family History  Problem Relation Age of Onset  . Lung cancer Mother   . Colon cancer Father     Health Maintenance  Topic Date Due  . TETANUS/TDAP  10/09/1959  . INFLUENZA VACCINE  Completed  . DEXA SCAN  Completed  . PNA vac Low Risk Adult  Completed    Allergies  Allergen Reactions  . Sporanos [Itraconazole] Anaphylaxis    Allergies as of 11/01/2016      Reactions   Sporanos [itraconazole] Anaphylaxis      Medication List       Accurate as of 11/01/16  1:11 PM. Always use your most recent med list.          acetaminophen 325 MG tablet Commonly known as:  TYLENOL Take 650 mg by mouth as needed.   fexofenadine 180 MG tablet Commonly known as:  ALLEGRA Take 180 mg by mouth daily.   fluticasone 50 MCG/ACT nasal spray Commonly known as:  FLONASE Place 2 sprays into both nostrils daily.   KRILL OIL PO Take by mouth.   omeprazole 40 MG capsule Commonly known as:  PRILOSEC Take 1 capsule (40 mg total) by mouth daily.   solifenacin 5 MG tablet Commonly known as:  VESICARE Take 1 tablet (5 mg total) by mouth daily.   VOLTAREN EX Apply topically as needed.   zolpidem 10 MG tablet Commonly known as:  AMBIEN Take 5 mg by mouth at bedtime as needed for sleep.       Review of Systems  Constitutional: Negative for chills, fever and malaise/fatigue.       Weight gain  HENT: Positive for congestion and hearing loss. Negative for sinus pain and sore throat.        Has bilateral hearing aides, cerumen impaction  Eyes:       Wears readers and also glasses when driving since cataract surgery (post lasik)  Respiratory: Negative for cough,  shortness of breath and stridor.   Cardiovascular: Negative for chest pain, palpitations and leg swelling.  Gastrointestinal: Negative for abdominal pain, blood in stool, constipation, diarrhea and melena.       Polyphagia  Genitourinary: Positive for urgency. Negative for dysuria, flank pain, frequency and hematuria.  Musculoskeletal: Negative for falls, joint pain and myalgias.       Right foot pain at times, has bony growth for a long time on right medial foot and developing some hammertoes  Skin: Negative for itching and rash.  Neurological: Positive for tingling. Negative for dizziness, tremors, sensory change, speech change, focal weakness, seizures, loss of consciousness, weakness and headaches.       In right foot  Endo/Heme/Allergies: Positive for environmental allergies.       Had testing many years ago that was negative though  Psychiatric/Behavioral: Negative for depression and memory loss.    Vitals:   11/01/16 0858  BP: 110/70  Pulse: 72  Temp: 98.1 F (36.7 C)  TempSrc: Oral  SpO2: 97%  Weight: 177 lb (80.3 kg)  Height: 5' 4.75" (1.645 m)   Body mass index is 29.68 kg/m. Physical Exam  Constitutional: She is oriented to person, place, and time. She appears well-developed and well-nourished. No distress.  HENT:  Head: Normocephalic and atraumatic.  Right Ear: External ear normal.  Left  Ear: External ear normal.  Nose: Nose normal.  Mouth/Throat: Oropharynx is clear and moist. No oropharyngeal exudate.  Cerumen impaction bilaterally  Eyes: EOM are normal. Pupils are equal, round, and reactive to light.  Neck: Neck supple. No JVD present.  Cardiovascular: Normal rate, regular rhythm, normal heart sounds and intact distal pulses.   Pulmonary/Chest: Effort normal and breath sounds normal. No respiratory distress.  Abdominal: Soft. Bowel sounds are normal. She exhibits no distension and no mass. There is no tenderness. There is no rebound and no guarding. No hernia.    Musculoskeletal: Normal range of motion. She exhibits no tenderness.  Right foot deformity with bony prominence at medial arch and hammertoes 3rd and 4th  Lymphadenopathy:    She has no cervical adenopathy.  Neurological: She is alert and oriented to person, place, and time. No cranial nerve deficit.  Skin: Skin is warm and dry. Capillary refill takes less than 2 seconds. No pallor.  Psychiatric: She has a normal mood and affect. Her behavior is normal. Judgment and thought content normal.    Labs reviewed: No records received thus far Previous PCP = Judson Roch, internal medicine  Imaging and Procedures noted on new patient packet: cscope 2014 did have polyps--was for f/u 5 years Mammogram 08/2016 normal diagnostic in [place] with oncology that followed her for her breast cancer  Assessment/Plan 1. History of cancer of right breast - s/p right lumpectomy and XRT, then completed over 3 years of tamoxifen before she had her DVT and it was stopped - CBC with Differential/Platelet - COMPLETE METABOLIC PANEL WITH GFR  2. History of DVT (deep vein thrombosis) - was provoked by tamoxifen therapy -had 6 months of eliquis therapy for this and f/u ultrasound showed resolution by report - CBC with Differential/Platelet - COMPLETE METABOLIC PANEL WITH GFR  3. History of shingles -right temple area, no eye involvement, has had zostavax but has not had zostrix yet and is interested  4. Gastroesophageal reflux disease without esophagitis - taking omeprazole, does not report obvious reflux symptoms, but does still have hoarseness, frequent throat clearing and some tickling cough which could be partially due to this and her postnasal drip/sinus/allergy concerns - COMPLETE METABOLIC PANEL WITH GFR  5. Primary insomnia -decrease ambien to 5mg  qhs from 10mg  (she has already been attempting this, but not real successful) -seems she already has pretty good sleep hygiene and has more difficulty  with turning her brain off to sleep (may benefit from a new antidepressant perhaps that might help her sleep, too)  6. Urge incontinence -counseled on benefits of vesicare (less anticholinergic) than detrol in geriatrics -she agrees to switch back to vesicare b/c she does think it worked better for her -she will order it from [place] due to cost here so Rx printed for 90 day supply with refills - COMPLETE METABOLIC PANEL WITH GFR  7. History of colon polyps -will need another cscope in 2019 due to these polyps in 2014 in [place] -no symptoms of concern  8. Postnasal drip - ongoing issue for at least a year - has tried nettipot, but agrees to try flonase for her PND - CBC with Differential/Platelet  9. Weight gain - has been a problem for many years and has tried numerous diet types, even stayed in a residential weight loss program and has seen dietitians over the years - wants to nip her weight gain in the bud this time before she gets over 200 lbs - CBC with Differential/Platelet - COMPLETE METABOLIC  PANEL WITH GFR - TSH  Labs/tests ordered:   Orders Placed This Encounter  Procedures  . HM MAMMOGRAPHY    This external order was created through the Results Console.  Marland Kitchen HM DEXA SCAN    This external order was created through the Results Console.  Marland Kitchen CBC with Differential/Platelet  . COMPLETE METABOLIC PANEL WITH GFR    [facility]  . TSH  . HM COLONOSCOPY    This external order was created through the Results Console.    Tawsha Terrero L. Devonna Oboyle, D.O. [facility] [address], [place] [address] Cell Phone (Mon-Fri 8am-5pm):  [phone] On Call:  [phone] & follow prompts after 5pm & weekends Office Phone:  [phone] Office Fax:  [phone]

## 2016-11-02 ENCOUNTER — Encounter: Payer: Self-pay | Admitting: *Deleted

## 2016-11-05 ENCOUNTER — Telehealth: Payer: Self-pay | Admitting: *Deleted

## 2016-11-05 ENCOUNTER — Encounter: Payer: Self-pay | Admitting: Internal Medicine

## 2016-11-05 NOTE — Telephone Encounter (Signed)
Received fax from [place] [phone] Fax: [phone]. Insurance CheckUp Results: Payee Name: [place] Medicare ID: [id number] A Individual Deductible $183.00  Fax given to Dr. Mariea Clonts to review. [place] resident.

## 2016-11-28 ENCOUNTER — Non-Acute Institutional Stay: Payer: Medicare Other | Admitting: Internal Medicine

## 2016-11-28 ENCOUNTER — Encounter: Payer: Self-pay | Admitting: Internal Medicine

## 2016-11-28 VITALS — BP 110/70 | HR 75 | Temp 97.8°F | Wt 180.0 lb

## 2016-11-28 DIAGNOSIS — F5101 Primary insomnia: Secondary | ICD-10-CM

## 2016-11-28 DIAGNOSIS — E669 Obesity, unspecified: Secondary | ICD-10-CM

## 2016-11-28 DIAGNOSIS — H029 Unspecified disorder of eyelid: Secondary | ICD-10-CM | POA: Diagnosis not present

## 2016-11-28 MED ORDER — NALTREXONE-BUPROPION HCL ER 8-90 MG PO TB12: ORAL_TABLET | ORAL | 1 refills | Status: DC

## 2016-11-28 NOTE — Progress Notes (Signed)
Location:  [facility] clinic Provider:  Liv Rallis L. Mariea Clonts, D.O., C.M.D.  Code Status: DNR Goals of Care:  Advanced Directives 11/01/2016  Does Patient Have a Medical Advance Directive? Yes  Type of Paramedic of [place];Living will  Does patient want to make changes to medical advance directive? -  Copy of [place] in Chart? No - copy requested  Pre-existing out of facility DNR order (yellow form or pink MOST form) -   Chief Complaint  Patient presents with  . Follow-up    discuss medications    HPI: Patient is a 76 y.o. female seen today for medical management of chronic diseases.    Says she is very happy here.  Her son is visiting from [place] tomorrow.    Weight is driving her nuts.  Weight is up 3 lbs.  Says it is getting out of control fast.  Then she's worrying about that and cannot sleep.  Then takes an [place] and doesn't want to have to keep doing that.  Wants to avoid lorazepam too if she can.  She also has been on the lexapro.  Feels like she's going through life on a flat line.  Says she occasionally had a low wbc in the past, but repeats have been ok.    Vesicare is helping her urgency.   Omeprazole helps her GERD.    Only taking the ambien, not ativan or lexapro now.  Says she is not going through any emotional or psychological concerns right now.  Does not want to see a dietitian.    Past Medical History:  Diagnosis Date  . Anxiety   . Breast cancer ([place]) 06/27/2012   Right  . Cataract   . DVT (deep venous thrombosis) ([place]) 06/28/2015  . Dyslipidemia   . GERD (gastroesophageal reflux disease)   . Insomnia   . Osteopenia   . Overactive bladder   . Shingles 06/2016  . Thyroid disease     Past Surgical History:  Procedure Laterality Date  . BREAST LUMPECTOMY Right 2014  . BREAST LUMPECTOMY  2013   right  . CATARACT EXTRACTION W/ INTRAOCULAR LENS IMPLANT Bilateral   . COLONOSCOPY  08/27/2012   [place]  . KNEE  ARTHROSCOPY     left  . LASIK Bilateral   . VAGINAL HYSTERECTOMY  2002    Allergies  Allergen Reactions  . Sporanos [Itraconazole] Anaphylaxis    Allergies as of 11/28/2016      Reactions   Sporanos [itraconazole] Anaphylaxis      Medication List       Accurate as of 11/28/16  2:41 PM. Always use your most recent med list.          acetaminophen 325 MG tablet Commonly known as:  TYLENOL Take 650 mg by mouth as needed.   escitalopram 10 MG tablet Commonly known as:  LEXAPRO   fexofenadine 180 MG tablet Commonly known as:  ALLEGRA Take 180 mg by mouth daily.   fluticasone 50 MCG/ACT nasal spray Commonly known as:  FLONASE Place 2 sprays into both nostrils daily.   LORazepam 0.5 MG tablet Commonly known as:  ATIVAN   omeprazole 40 MG capsule Commonly known as:  PRILOSEC Take 1 capsule (40 mg total) by mouth daily.   solifenacin 5 MG tablet Commonly known as:  VESICARE Take 1 tablet (5 mg total) by mouth daily.   VOLTAREN EX Apply topically as needed.   zolpidem 10 MG tablet Commonly known as:  AMBIEN  Take 5 mg by mouth at bedtime as needed for sleep.       Review of Systems:  Review of Systems  Constitutional: Negative for chills, fever and malaise/fatigue.  HENT: Negative for hearing loss.   Eyes:       Glasses for reading  Respiratory: Negative for shortness of breath.   Cardiovascular: Negative for chest pain and palpitations.  Gastrointestinal: Negative for abdominal pain.  Genitourinary: Negative for dysuria.  Musculoskeletal: Negative for falls and joint pain.  Skin: Negative for itching and rash.  Neurological: Negative for dizziness, loss of consciousness and weakness.  Endo/Heme/Allergies: Does not bruise/bleed easily.  Psychiatric/Behavioral: Positive for depression. Negative for hallucinations and memory loss. The patient is nervous/anxious and has insomnia.     Health Maintenance  Topic Date Due  . TETANUS/TDAP  10/09/1959  .  INFLUENZA VACCINE  03/27/2017  . DEXA SCAN  Completed  . PNA vac Low Risk Adult  Completed    Physical Exam: Vitals:   11/28/16 1436  BP: 110/70  Pulse: 75  Temp: 97.8 F (36.6 C)  TempSrc: Oral  SpO2: 97%  Weight: 180 lb (81.6 kg)   Body mass index is 30.19 kg/m. Physical Exam  Constitutional: She is oriented to person, place, and time. She appears well-developed and well-nourished. No distress.  Cardiovascular: Normal rate, regular rhythm, normal heart sounds and intact distal pulses.   Pulmonary/Chest: Effort normal and breath sounds normal. No respiratory distress.  Musculoskeletal: Normal range of motion.  Neurological: She is alert and oriented to person, place, and time.  Psychiatric: She has a normal mood and affect. Her behavior is normal. Judgment and thought content normal.    Labs reviewed: Basic Metabolic Panel:  Recent Labs  11/01/16 1031  NA 138  K 4.8  CL 104  CO2 27  GLUCOSE 88  BUN 21  CREATININE 0.77  CALCIUM 9.5  TSH 2.95   Liver Function Tests:  Recent Labs  11/01/16 1031  AST 13  ALT 16  ALKPHOS 42  BILITOT 0.4  PROT 6.8  ALBUMIN 4.3   No results for input(s): LIPASE, AMYLASE in the last 8760 hours. No results for input(s): AMMONIA in the last 8760 hours. CBC:  Recent Labs  11/01/16 1031  WBC 3.6*  NEUTROABS 2,196  HGB 13.4  HCT 40.2  MCV 94.8  PLT 234   Assessment/Plan 1. Obesity (BMI 30.0-34.9) - pt has tried everything and is actually gaining weight instead of losing despite diet and exercise program -reports her nerves are bad at night about her weight gain and it is affecting her day to day life -she took phentermine when she was young (the original stuff) -agrees to try contrave after we reviewed mechanism of action, side effects, drug interactions, etc. -she cannot take opioids or antidepressants while on this due to risk of withdrawals and serotonin syndrome -counseled to continue to work on diet and exercise  program - Naltrexone-Bupropion HCl ER (CONTRAVE) 8-90 MG TB12; One tab daily in the am for 1 wk, increase to 1 tab bid in am & pm x 1 wk, then 2 tabs in am & 1 tab in pm x 1 week; then, 2 tabs bid thereafter  Dispense: 120 tablet; Refill: 1  2. Primary insomnia -due to anxiety about weight gain -uses ambien 5mg  qhs prn (not nightly)  Labs/tests ordered:  No new Next appt:  01/02/2017 for f/u weight  Mireille Lacombe L. Clairessa Boulet, D.O. [facility] [address]  [place], [place] [address] Cell Phone (Mon-Fri 8am-5pm):  [phone] On Call:  [phone] & follow prompts after 5pm & weekends Office Phone:  [phone] Office Fax:  [phone]

## 2016-12-06 ENCOUNTER — Telehealth: Payer: Self-pay

## 2016-12-06 MED ORDER — ZOSTER VAC RECOMB ADJUVANTED 50 MCG/0.5ML IM SUSR: 0.5000 mL | Freq: Once | INTRAMUSCULAR | 0 refills | Status: AC

## 2016-12-06 NOTE — Telephone Encounter (Signed)
[place] form was completed on patient. Once formed received back there was some confusion on exactly what the cost for vaccine would be for the patient.  I consulted with a [place] rep in person and was told to call the reimbursement support center.    I contacted the [place] @ [phone] and was told that the verification would be sent to management for further review and someone would return my call  My call was returned later that day and I was informed that management will contact the patients insurance company to verify cost for verification results provided had information that was not direct and clear.  After 3 days and not hearing back from [place], I followed new information provided by [place] rep. I called the patient and informed her that I will send rx for Shingrix to pharmacy of her choice, for patients 58 and older with have best coverage through the pharmacy benefits, part D.  RX sent   Verification form sent to scanning

## 2016-12-07 ENCOUNTER — Ambulatory Visit: Payer: Medicare Other | Admitting: Podiatry

## 2016-12-14 ENCOUNTER — Ambulatory Visit: Payer: Medicare Other | Admitting: Podiatry

## 2016-12-21 ENCOUNTER — Ambulatory Visit (INDEPENDENT_AMBULATORY_CARE_PROVIDER_SITE_OTHER): Payer: Medicare Other

## 2016-12-21 ENCOUNTER — Encounter: Payer: Self-pay | Admitting: Podiatry

## 2016-12-21 ENCOUNTER — Ambulatory Visit (INDEPENDENT_AMBULATORY_CARE_PROVIDER_SITE_OTHER): Payer: Medicare Other | Admitting: Podiatry

## 2016-12-21 ENCOUNTER — Ambulatory Visit: Payer: Medicare Other

## 2016-12-21 VITALS — BP 108/64 | HR 70 | Resp 16 | Ht 64.0 in | Wt 170.0 lb

## 2016-12-21 DIAGNOSIS — M779 Enthesopathy, unspecified: Secondary | ICD-10-CM

## 2016-12-21 DIAGNOSIS — M79671 Pain in right foot: Secondary | ICD-10-CM | POA: Diagnosis not present

## 2016-12-21 DIAGNOSIS — M79672 Pain in left foot: Secondary | ICD-10-CM

## 2016-12-21 DIAGNOSIS — M2041 Other hammer toe(s) (acquired), right foot: Secondary | ICD-10-CM | POA: Diagnosis not present

## 2016-12-21 MED ORDER — TRIAMCINOLONE ACETONIDE 10 MG/ML IJ SUSP
10.0000 mg | Freq: Once | INTRAMUSCULAR | Status: AC
  Administered 2016-12-21: 10 mg

## 2016-12-21 NOTE — Patient Instructions (Signed)

## 2016-12-21 NOTE — Progress Notes (Signed)
   Subjective:    Patient ID: Jillian Norton, female    DOB: September 06, 1940, 77 y.o.   MRN: [id number]  HPI Chief Complaint  Patient presents with  . Foot Pain    Right foot; arch, dorsal, plantar forefoot; "metatarsal feel numb"; ;x4 yrs  . Ankle Pain    Right foot; medial side; x2 days  . Toe Pain    Right foot; great toe-joint  . Painful lesion    Right foot; 4th toe-top of toe; pt stated, "saw last night for the first time"      Review of Systems  Musculoskeletal: Positive for arthralgias and gait problem.  All other systems reviewed and are negative.      Objective:   Physical Exam        Assessment & Plan:

## 2016-12-24 NOTE — Progress Notes (Signed)
Subjective:    Patient ID: Jillian Norton, female   DOB: 76 y.o.   MRN: [id number]   HPI patient presents with several different problems with one being exquisite tenderness in the right heel and arch another being a digital deformity fourth digit right with lesion formation and generalized foot pain around the big toe joint. States that the pain in the arch is been present along this and at least several months    Review of Systems  All other systems reviewed and are negative.       Objective:  Physical Exam  Cardiovascular: Intact distal pulses.   Musculoskeletal: Normal range of motion.  Neurological: She is alert.  Skin: Skin is warm.  Nursing note and vitals reviewed.  neurovascular status was found to be intact with muscle strength adequate range of motion within normal limits. Patient's noted to have inflammation pain in the right heel and arch with inflammation fluid buildup around the tendon complex and keratotic lesion fourth digit right that's painful when pressed. 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 Applied fascial brace to both lift the arch up and to provide for functional support and patient will be seen back in 2 weeks to reevaluate  X-ray report indicates that there is small spur formation with mild osteoporosis and arthritis and rotation fourth toe right

## 2017-01-02 ENCOUNTER — Non-Acute Institutional Stay: Payer: Medicare Other | Admitting: Internal Medicine

## 2017-01-02 ENCOUNTER — Encounter: Payer: Self-pay | Admitting: Internal Medicine

## 2017-01-02 VITALS — BP 110/70 | HR 64 | Temp 97.7°F | Wt 174.0 lb

## 2017-01-02 DIAGNOSIS — F5101 Primary insomnia: Secondary | ICD-10-CM

## 2017-01-02 DIAGNOSIS — E669 Obesity, unspecified: Secondary | ICD-10-CM

## 2017-01-02 DIAGNOSIS — H6123 Impacted cerumen, bilateral: Secondary | ICD-10-CM | POA: Diagnosis not present

## 2017-01-02 MED ORDER — LORCASERIN HCL 10 MG PO TABS: 10.0000 mg | ORAL_TABLET | Freq: Two times a day (BID) | ORAL | 3 refills | Status: DC

## 2017-01-02 NOTE — Progress Notes (Signed)
Location:  [facility] clinic Provider:  Satina Jerrell L. Mariea Clonts, D.O., C.M.D.  Code Status: DNR Goals of Care:  Advanced Directives 01/02/2017  Does Patient Have a Medical Advance Directive? Yes  Type of Paramedic of [place];Living will;Out of facility DNR (pink MOST or yellow form)  Does patient want to make changes to medical advance directive? -  Copy of [place] in Chart? Yes  Pre-existing out of facility DNR order (yellow form or pink MOST form) Yellow form placed in chart (order not valid for inpatient use)   Chief Complaint  Patient presents with  . Medical Management of Chronic Issues    4week follow-up    HPI: Patient is a 76 y.o. female seen today for medical management of chronic diseases---appt was made to f/u on her weight since starting contrave weight loss medication.    She c/o cerumen impaction and requests ears to be flushed after using debrox.  Difficulty hearing.  She is less compelled to eat.  She is having difficulty sleeping now.  When she went to titrate up for stage 2 of the progression, she got very jumpy and funny.  She was afraid to keep increasing it.  She quit taking the second pill.  Weight up 4 lbs.  She's getting horrible nightmares.  Tried a whole 10mg  ambien and it did not work--was waking up every 1.5 hrs.  She has had very little gluten since she was in here and off of grains.  Eating sweet potatoes and white potatoes, higher protein, fruits and veggies.  She is down 4 lbs on her scale.   She's doing exercise classes (3 per week, plus walking a lot, golfing 2-3 times per week).    Past Medical History:  Diagnosis Date  . Anxiety   . Breast cancer ([place]) 06/27/2012   Right  . Cataract   . DVT (deep venous thrombosis) ([place]) 06/28/2015  . Dyslipidemia   . GERD (gastroesophageal reflux disease)   . Insomnia   . Osteopenia   . Overactive bladder   . Shingles 06/2016  . Thyroid disease     Past Surgical History:    Procedure Laterality Date  . BREAST LUMPECTOMY Right 2014  . BREAST LUMPECTOMY  2013   right  . CATARACT EXTRACTION W/ INTRAOCULAR LENS IMPLANT Bilateral   . COLONOSCOPY  08/27/2012   [place]  . KNEE ARTHROSCOPY     left  . LASIK Bilateral   . VAGINAL HYSTERECTOMY  2002    Allergies  Allergen Reactions  . Sporanos [Itraconazole] Anaphylaxis    Allergies as of 01/02/2017      Reactions   Sporanos [itraconazole] Anaphylaxis      Medication List       Accurate as of 01/02/17 11:27 AM. Always use your most recent med list.          acetaminophen 325 MG tablet Commonly known as:  TYLENOL Take 650 mg by mouth as needed.   fexofenadine 180 MG tablet Commonly known as:  ALLEGRA Take 180 mg by mouth daily.   Naltrexone-Bupropion HCl ER 8-90 MG Tb12 Commonly known as:  CONTRAVE One tab daily in the am for 1 wk, increase to 1 tab bid in am & pm x 1 wk, then 2 tabs in am & 1 tab in pm x 1 week; then, 2 tabs bid thereafter   omeprazole 40 MG capsule Commonly known as:  PRILOSEC Take 1 capsule (40 mg total) by mouth daily.   solifenacin  5 MG tablet Commonly known as:  VESICARE Take 1 tablet (5 mg total) by mouth daily.   VOLTAREN EX Apply topically as needed.   zolpidem 10 MG tablet Commonly known as:  AMBIEN Take 5 mg by mouth at bedtime as needed for sleep.       Review of Systems:  Review of Systems  Constitutional: Negative for chills, fever and malaise/fatigue.  HENT: Positive for hearing loss.        Due to cerumen impaction  Eyes: Negative for blurred vision.       Reading glasses  Respiratory: Negative for shortness of breath.   Cardiovascular: Negative for chest pain, palpitations and leg swelling.  Gastrointestinal: Negative for abdominal pain, blood in stool, constipation, diarrhea and melena.  Genitourinary: Negative for dysuria.  Musculoskeletal: Negative for falls.  Skin: Negative for itching and rash.  Neurological: Negative for dizziness, loss  of consciousness and weakness.  Endo/Heme/Allergies: Does not bruise/bleed easily.  Psychiatric/Behavioral: Negative for depression and memory loss. The patient has insomnia. The patient is not nervous/anxious.     Health Maintenance  Topic Date Due  . TETANUS/TDAP  10/09/1959  . INFLUENZA VACCINE  03/27/2017  . DEXA SCAN  Completed  . PNA vac Low Risk Adult  Completed    Physical Exam: Vitals:   01/02/17 1122  BP: 110/70  Pulse: 64  Temp: 97.7 F (36.5 C)  TempSrc: Oral  SpO2: 98%  Weight: 174 lb (78.9 kg)   Body mass index is 29.87 kg/m. Physical Exam  Constitutional: She is oriented to person, place, and time. She appears well-developed and well-nourished. No distress.  HENT:  Bilateral cerumen impaction not fully resolved with flushing with warm water and peroxide  Cardiovascular: Normal rate, regular rhythm and normal heart sounds.   Pulmonary/Chest: Effort normal and breath sounds normal.  Musculoskeletal: Normal range of motion.  Neurological: She is alert and oriented to person, place, and time.  Skin: Skin is warm and dry.  Psychiatric: She has a normal mood and affect.    Labs reviewed: Basic Metabolic Panel:  Recent Labs  11/01/16 1031  NA 138  K 4.8  CL 104  CO2 27  GLUCOSE 88  BUN 21  CREATININE 0.77  CALCIUM 9.5  TSH 2.95   Liver Function Tests:  Recent Labs  11/01/16 1031  AST 13  ALT 16  ALKPHOS 42  BILITOT 0.4  PROT 6.8  ALBUMIN 4.3   No results for input(s): LIPASE, AMYLASE in the last 8760 hours. No results for input(s): AMMONIA in the last 8760 hours. CBC:  Recent Labs  11/01/16 1031  WBC 3.6*  NEUTROABS 2,196  HGB 13.4  HCT 40.2  MCV 94.8  PLT 234   Lipid Panel: No results for input(s): CHOL, HDL, LDLCALC, TRIG, CHOLHDL, LDLDIRECT in the last 8760 hours. No results found for: HGBA1C  Procedures since last visit: Dg Foot 2 Views Left  Result Date: 12/21/2016 Please see detailed radiograph report in office  note.  Dg Foot 2 Views Right  Result Date: 12/21/2016 Please see detailed radiograph report in office note.   Assessment/Plan 1. Obesity (BMI 30.0-34.9) -ongoing, per [place] she gained 4 lbs, but per her home scale she lost 4 lbs with contrave just one daily, diet and exercise as above -unfortunately, contrave made her fidgety and wake up every 1.5 hrs at night so we decided to d/c and try something else -begin locaserin 10mg  po bid w/ or w/o food to help with weight loss--cannot take  lexapro or other ssri or snri while on this -keep 8 wk f/u  2. Primary insomnia -cont ambien 5mg  qhs prn--has been on for years--even 10mg  did not work while on the [employer] so we opted to change her weight loss pill  3. Bilateral impacted cerumen -she had only done debrox one night before coming -flushing with warm water and debrox was not a success so advised to use the debrox nighly for a full week and then come to see Maudie Mercury or Mliss Sax to flush her ears again  Labs/tests ordered:  No new Next appt:  03/06/2017   Quadarius Henton L. Shakeerah Gradel, D.O. [facility] [address], [place] [address] Cell Phone (Mon-Fri 8am-5pm):  [phone] On Call:  [phone] & follow prompts after 5pm & weekends Office Phone:  [phone] Office Fax:  [phone]

## 2017-01-04 ENCOUNTER — Ambulatory Visit: Payer: Medicare Other | Admitting: Podiatry

## 2017-01-07 ENCOUNTER — Ambulatory Visit (INDEPENDENT_AMBULATORY_CARE_PROVIDER_SITE_OTHER): Payer: Medicare Other | Admitting: Podiatry

## 2017-01-07 ENCOUNTER — Encounter: Payer: Self-pay | Admitting: Podiatry

## 2017-01-07 DIAGNOSIS — M779 Enthesopathy, unspecified: Secondary | ICD-10-CM

## 2017-01-07 DIAGNOSIS — M7750 Other enthesopathy of unspecified foot: Secondary | ICD-10-CM | POA: Diagnosis not present

## 2017-01-09 NOTE — Progress Notes (Signed)
Subjective:    Patient ID: Jillian Norton, female   DOB: 76 y.o.   MRN: [id number]   HPI patient states she continues to have pain in the right foot and states that she's tried to change her shoe gear tried ice therapy and other modalities without relief    ROS      Objective:  Physical Exam neurovascular status intact with anterior tibial pain that is present right with continued inflammation at the insertion into the cuneiform base of first metatarsal     Assessment:    Anterior tibial tendinitis still present     Plan:    I do think the best solution for this patient would be custom orthotics and I recommended a custom orthotic at the current time and reviewed custom orthotics and until those are made by Liliane Channel who is our [place] this she will continue to use ice and supportive shoes

## 2017-01-15 ENCOUNTER — Other Ambulatory Visit: Payer: Medicare Other

## 2017-01-23 ENCOUNTER — Ambulatory Visit (INDEPENDENT_AMBULATORY_CARE_PROVIDER_SITE_OTHER): Payer: Medicare Other | Admitting: Podiatry

## 2017-01-23 DIAGNOSIS — M7750 Other enthesopathy of unspecified foot: Secondary | ICD-10-CM | POA: Diagnosis not present

## 2017-01-23 DIAGNOSIS — H6123 Impacted cerumen, bilateral: Secondary | ICD-10-CM | POA: Diagnosis not present

## 2017-01-23 DIAGNOSIS — M76811 Anterior tibial syndrome, right leg: Secondary | ICD-10-CM

## 2017-01-23 NOTE — Progress Notes (Signed)
Patient presents today for [facility] appointment for foot orthotics...patient has hx of foot pain rt along medial arch at insertion of anterior tib tendon at 1st met base/cunniform.  This area is still tender to the touch, but feels much better since Dr. Paulla Dolly injection.   Goal is to offer longitudinal arch support while offloading the ATT at insertion.   Plan on super soft F/O with grove and 1st ray cutout to take pressure off area of concern.  Patient also had tenderness between 2nd and 3rd as well as 3rd and 4th webbing of toes right foot...possible neuromas...plan on offloading with met bar proximal to met heads...  [employer] to fab  [id number] x 2

## 2017-01-30 ENCOUNTER — Non-Acute Institutional Stay: Payer: Medicare Other | Admitting: Internal Medicine

## 2017-01-30 ENCOUNTER — Encounter: Payer: Self-pay | Admitting: Internal Medicine

## 2017-01-30 VITALS — BP 120/80 | HR 75 | Temp 98.2°F | Wt 177.0 lb

## 2017-01-30 DIAGNOSIS — A6920 Lyme disease, unspecified: Secondary | ICD-10-CM

## 2017-01-30 DIAGNOSIS — M25541 Pain in joints of right hand: Secondary | ICD-10-CM

## 2017-01-30 DIAGNOSIS — R635 Abnormal weight gain: Secondary | ICD-10-CM

## 2017-01-30 MED ORDER — AMOXICILLIN 500 MG PO CAPS: 500.0000 mg | ORAL_CAPSULE | Freq: Three times a day (TID) | ORAL | 0 refills | Status: AC

## 2017-01-30 NOTE — Progress Notes (Signed)
Location:     Place of Service:  Clinic (12)  Provider: Aragon Scarantino L. Mariea Clonts, D.O., C.M.D.  Code Status: DNR Goals of Care:  Advanced Directives 01/02/2017  Does Patient Have a Medical Advance Directive? Yes  Type of Paramedic of [place];Living will;Out of facility DNR (pink MOST or yellow form)  Does patient want to make changes to medical advance directive? -  Copy of [place] in Chart? Yes  Pre-existing out of facility DNR order (yellow form or pink MOST form) Yellow form placed in chart (order not valid for inpatient use)     Chief Complaint  Patient presents with  . Acute Visit    insect bite    HPI: Patient is a 76 y.o. female seen today for an acute visit for bites on her right posterior thigh after golfing.  Staff are concerned about the one which has a big red ring around it and is spreading.  Both are mildly pruritic.  Pt saw deer ticks on the golf course.    Right index finger MCP joint is getting more bothersome and interfering with her golf grip.  It's sore and stiff at that joint, mild swelling present.  She requests to see ortho.  Weight:  Up more.  Did not tolerate the lorcaserin either due to dizziness and worsened insomnia to where her Lorrin Mais did not help her sleep.  She says she's going to suck it up and be a big girl.  She is doing her exercise and trying to follow a healthy diet.    Past Medical History:  Diagnosis Date  . Anxiety   . Breast cancer ([place]) 06/27/2012   Right  . Cataract   . DVT (deep venous thrombosis) ([place]) 06/28/2015  . Dyslipidemia   . GERD (gastroesophageal reflux disease)   . Insomnia   . Osteopenia   . Overactive bladder   . Shingles 06/2016  . Thyroid disease     Past Surgical History:  Procedure Laterality Date  . BREAST LUMPECTOMY Right 2014  . BREAST LUMPECTOMY  2013   right  . CATARACT EXTRACTION W/ INTRAOCULAR LENS IMPLANT Bilateral   . COLONOSCOPY  08/27/2012   [place]  .  KNEE ARTHROSCOPY     left  . LASIK Bilateral   . VAGINAL HYSTERECTOMY  2002    Allergies  Allergen Reactions  . Sporanos [Itraconazole] Anaphylaxis    Allergies as of 01/30/2017      Reactions   Sporanos [itraconazole] Anaphylaxis      Medication List       Accurate as of 01/30/17  3:00 PM. Always use your most recent med list.          acetaminophen 325 MG tablet Commonly known as:  TYLENOL Take 650 mg by mouth as needed. Pt stated, "When I have pain, takes 3 tablets at a time for the pain; it does help"   omeprazole 40 MG capsule Commonly known as:  PRILOSEC Take 1 capsule (40 mg total) by mouth daily.   VOLTAREN EX Apply topically as needed.   zolpidem 10 MG tablet Commonly known as:  AMBIEN Take 5 mg by mouth at bedtime as needed for sleep.       Review of Systems:  Review of Systems  Constitutional: Negative for chills, fever and malaise/fatigue.  HENT: Negative for congestion.   Eyes: Negative for blurred vision.  Respiratory: Negative for cough and shortness of breath.   Cardiovascular: Negative for chest pain, palpitations and  leg swelling.  Gastrointestinal: Negative for abdominal pain.  Genitourinary: Negative for dysuria.  Musculoskeletal: Positive for joint pain. Negative for falls.  Skin: Positive for itching and rash.  Neurological: Positive for dizziness. Negative for weakness.  Psychiatric/Behavioral: Negative for depression and memory loss. The patient has insomnia. The patient is not nervous/anxious.     Health Maintenance  Topic Date Due  . TETANUS/TDAP  10/09/1959  . INFLUENZA VACCINE  03/27/2017  . DEXA SCAN  Completed  . PNA vac Low Risk Adult  Completed    Physical Exam: Vitals:   01/30/17 1456  BP: 120/80  Pulse: 75  Temp: 98.2 F (36.8 C)  TempSrc: Oral  SpO2: 97%  Weight: 177 lb (80.3 kg)   Body mass index is 30.38 kg/m. Physical Exam  Constitutional: She is oriented to person, place, and time. She appears  well-developed and well-nourished. No distress.  Cardiovascular: Normal rate, regular rhythm, normal heart sounds and intact distal pulses.   Pulmonary/Chest: Effort normal and breath sounds normal. No respiratory distress.  Musculoskeletal: Normal range of motion. She exhibits tenderness.  At base of right second digit/MCP joint  Neurological: She is alert and oriented to person, place, and time.  Skin: Skin is warm and dry.  Large bullseye rash on right posterior thigh, smaller second bite with minimal erythema on right lateral thigh  Psychiatric: She has a normal mood and affect.    Labs reviewed: Basic Metabolic Panel:  Recent Labs  11/01/16 1031  NA 138  K 4.8  CL 104  CO2 27  GLUCOSE 88  BUN 21  CREATININE 0.77  CALCIUM 9.5  TSH 2.95   Liver Function Tests:  Recent Labs  11/01/16 1031  AST 13  ALT 16  ALKPHOS 42  BILITOT 0.4  PROT 6.8  ALBUMIN 4.3   No results for input(s): LIPASE, AMYLASE in the last 8760 hours. No results for input(s): AMMONIA in the last 8760 hours. CBC:  Recent Labs  11/01/16 1031  WBC 3.6*  NEUTROABS 2,196  HGB 13.4  HCT 40.2  MCV 94.8  PLT 234   Assessment/Plan 1. 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  Dispense: 42 capsule; Refill: 0 -counseled on eating yogurt daily while on this to keep bowel flora intact and avoid yeast  infections  3. Weight gain/obesity:   -did not tolerate contrave or locaserin due to difficulty with insomnia and both insomnia and dizziness respectively -she has tried most other products and many diets and programs in the past -says she is going to cont to work on diet and exercise at this point   Labs/tests ordered:  No new Next appt:  03/06/2017  Noriah Osgood L. Kaeo Jacome, D.O. [facility] [address], [place] [address] Cell Phone (Mon-Fri 8am-5pm):  [phone] On Call:  [phone] & follow prompts after 5pm & weekends Office Phone:  [phone] Office Fax:  [phone]

## 2017-02-04 DIAGNOSIS — Z961 Presence of intraocular lens: Secondary | ICD-10-CM | POA: Diagnosis not present

## 2017-02-04 DIAGNOSIS — H524 Presbyopia: Secondary | ICD-10-CM | POA: Diagnosis not present

## 2017-02-08 ENCOUNTER — Ambulatory Visit (INDEPENDENT_AMBULATORY_CARE_PROVIDER_SITE_OTHER): Payer: Medicare Other

## 2017-02-08 ENCOUNTER — Ambulatory Visit (INDEPENDENT_AMBULATORY_CARE_PROVIDER_SITE_OTHER): Payer: Medicare Other | Admitting: Physician Assistant

## 2017-02-08 DIAGNOSIS — M654 Radial styloid tenosynovitis [de Quervain]: Secondary | ICD-10-CM

## 2017-02-08 DIAGNOSIS — M25552 Pain in left hip: Secondary | ICD-10-CM

## 2017-02-08 MED ORDER — LIDOCAINE HCL 1 % IJ SOLN
1.0000 mL | INTRAMUSCULAR | Status: AC | PRN
  Administered 2017-02-08: 1 mL

## 2017-02-08 MED ORDER — METHYLPREDNISOLONE ACETATE 40 MG/ML IJ SUSP
40.0000 mg | INTRAMUSCULAR | Status: AC | PRN
  Administered 2017-02-08: 40 mg

## 2017-02-08 NOTE — Progress Notes (Signed)
Office Visit Note   Patient: Jillian Norton           Date of Birth: [birth year]           MRN: [id number] Visit Date: 02/08/2017              Requested by: Gayland Curry, DO [place], [place] [address] PCP: Gayland Curry, DO   Assessment & Plan: Visit Diagnoses:  1. De Quervain's disease (tenosynovitis)   2. Pain of left hip joint     Plan:She'll apply 2 g of Voltaren gel 3-4 times daily to the right hand particularly over the first extensor compartment and also to the second carpometacarpal joint. Did offer her a thumb spica splint she defers. In regards to her hip and she deferred a hip injection at this point in time she did answer some IT band stretching exercises once she was shown the she had pain in her groin which over the IT band. She liked to see how this goes and follow with [place] in 2 weeks to see if the hip pain is improving are still the same. If her hip pain persists would recommend AP lateral views of the left hip. See her back in 2 weeks and also reevaluate her right hand pain.  Follow-Up Instructions: Return in about 2 weeks (around 02/22/2017).   Orders:  Orders Placed This Encounter  Procedures  . Hand/Upper Extremity Injection/Arthrocentesis  . XR Hand Complete Right   No orders of the defined types were placed in this encounter.     Procedures: Hand/UE Inj Date/Time: 02/08/2017 11:49 AM Performed by: Pete Pelt Authorized by: Pete Pelt   Consent Given by:  Patient Indications:  Pain and therapeutic Condition: de Quervain's   Needle Size:  25 G Approach:  Dorsal Medications:  1 mL lidocaine 1 %; 40 mg methylPREDNISolone acetate 40 MG/ML     Clinical Data: No additional findings.   Subjective: Right wrist pain Left hip pain  HPI Jillian Norton is a 76 year old female who were seen for the first time. She's recently moved to the area from [place]. She's having pain in her right hand and wrist. Mostly affecting the thumb  and the index finger. This been ongoing for the past 6-8 months. Becoming worse. It is affecting her cough grip. She's tried some  Voltaren gel periodically. Not really taking it on a daily basis. She also mentions that she's had some hip pain tickly with exercise over the lateral aspect of the left hip. She's having no difficulty putting on her shoes socks. This been ongoing just for a few days. She's had no known injury to the hip. Review of Systems No fevers chills shortness breath chest pain. Denies any radicular symptoms down either leg Please otherwise see history of present illness  Objective: Vital Signs: There were no vitals taken for this visit.  Physical Exam  Constitutional: She is oriented to person, place, and time. She appears well-developed and well-nourished. No distress.  Neurological: She is alert and oriented to person, place, and time.  Psychiatric: She has a normal mood and affect. Her behavior is normal.    Ortho Exam Right hand she has positive Finkelstein she's tender over the first extensor compartment. She also has tenderness at the second webspace of the right hand. Negative grind tests right thumb No rashes skin lesions ulcerations erythema. Full motor sensation both hands. Radial pulses are 2+ equal and symmetric. Bilateral hips good range  of motion without pain. Tenderness over the left trochanteric region. With hip flexion and abduction of the left hip she has pain in the left groin area. No pain with range of motion of the right hip. Specialty Comments:  No specialty comments available.  Imaging: Xr Hand Complete Right  Result Date: 02/08/2017 3 views right hand: No acute fracture. Metacarpal, carpal and wrist joint all well maintained. No bony abnormalities.    PMFS History: Patient Active Problem List   Diagnosis Date Noted  . GERD (gastroesophageal reflux disease) 11/01/2016  . Primary insomnia 11/01/2016  . Urge incontinence 11/01/2016  . History  of colon polyps 11/01/2016  . Postnasal drip 11/01/2016  . Weight gain 11/01/2016  . Shingles 06/27/2016  . DVT (deep venous thrombosis) ([place]) 06/28/2015  . Breast cancer ([place]) 06/27/2012   Past Medical History:  Diagnosis Date  . Anxiety   . Breast cancer ([place]) 06/27/2012   Right  . Cataract   . DVT (deep venous thrombosis) ([place]) 06/28/2015  . Dyslipidemia   . GERD (gastroesophageal reflux disease)   . Insomnia   . Osteopenia   . Overactive bladder   . Shingles 06/2016  . Thyroid disease     Family History  Problem Relation Age of Onset  . Lung cancer Mother   . Colon cancer Father     Past Surgical History:  Procedure Laterality Date  . BREAST LUMPECTOMY Right 2014  . BREAST LUMPECTOMY  2013   right  . CATARACT EXTRACTION W/ INTRAOCULAR LENS IMPLANT Bilateral   . COLONOSCOPY  08/27/2012   [place]  . KNEE ARTHROSCOPY     left  . LASIK Bilateral   . VAGINAL HYSTERECTOMY  2002   Social History   Occupational History  . Not on file.   Social History Main Topics  . Smoking status: Former Smoker    Years: 10.00    Types: Cigarettes  . Smokeless tobacco: Never Used  . Alcohol use Yes     Comment: 7 drinks per week   . Drug use: No  . Sexual activity: Not on file

## 2017-02-13 ENCOUNTER — Ambulatory Visit: Payer: Medicare Other | Admitting: Orthotics

## 2017-02-14 ENCOUNTER — Ambulatory Visit: Payer: Medicare Other | Admitting: Orthotics

## 2017-02-18 ENCOUNTER — Encounter (INDEPENDENT_AMBULATORY_CARE_PROVIDER_SITE_OTHER): Payer: Medicare Other | Admitting: Orthotics

## 2017-02-22 ENCOUNTER — Telehealth: Payer: Self-pay | Admitting: Internal Medicine

## 2017-02-22 NOTE — Telephone Encounter (Signed)
Left msg asking pt to call and schedule her AWV-I on 02/26/17 if possible. VDM (DD)

## 2017-02-22 NOTE — Telephone Encounter (Signed)
Correction...AWV-S, not AWV-I.  Last AWV date unknown. VDM (DD)

## 2017-03-04 ENCOUNTER — Ambulatory Visit (INDEPENDENT_AMBULATORY_CARE_PROVIDER_SITE_OTHER): Payer: Medicare Other

## 2017-03-04 ENCOUNTER — Ambulatory Visit (INDEPENDENT_AMBULATORY_CARE_PROVIDER_SITE_OTHER): Payer: Medicare Other | Admitting: Orthopaedic Surgery

## 2017-03-04 DIAGNOSIS — M25551 Pain in right hip: Secondary | ICD-10-CM | POA: Diagnosis not present

## 2017-03-04 DIAGNOSIS — M25552 Pain in left hip: Secondary | ICD-10-CM

## 2017-03-04 NOTE — Progress Notes (Signed)
The patient comes in today with 2 different issues. Once she is following up after a right wrist injection for de Quervain's tenosynovitis. She said that his work great and her pain is minimal. She also has bilateral hip pain and she points the trochanteric area of both her hips. She will need to take a look at those today.  On examination of her right wrist, her range of motion is full. She has a negative Finkelstein exam. There is some tenderness where I injected her but it is mild. Examination of both hips show fluid range of motion actively and passively both hips with no pain in the groin at all. She has only pain of the trochanteric area consistent with trochanteric bursitis of both hips.  An AP pelvis and a lateral both hips show no significant arthritic findings. The hips are well located. There is no acute findings.  At this point physical therapy would definitely be worthwhile to try. Offered injections but she is deferring this without trying some therapy first migraine with this. She is a resident of [facility] and says she can try therapy there. She'll follow up otherwise as needed.

## 2017-03-05 ENCOUNTER — Non-Acute Institutional Stay: Payer: Medicare Other

## 2017-03-05 VITALS — BP 120/60 | HR 69 | Temp 98.0°F | Ht 64.0 in | Wt 178.0 lb

## 2017-03-05 DIAGNOSIS — Z0000 Encounter for general adult medical examination without abnormal findings: Secondary | ICD-10-CM

## 2017-03-05 MED ORDER — TETANUS-DIPHTH-ACELL PERTUSSIS 5-2.5-18.5 LF-MCG/0.5 IM SUSP: 0.5000 mL | Freq: Once | INTRAMUSCULAR | 0 refills | Status: AC

## 2017-03-05 NOTE — Patient Instructions (Addendum)
Jillian Norton , Thank you for taking time to come for your Medicare Wellness Visit. I appreciate your ongoing commitment to your health goals. Please review the following plan we discussed and let me know if I can assist you in the future.   Screening recommendations/referrals: Colonoscopy up to date, pt over age 76 Mammogram up to date, pt over age 11 Bone Density up to date Recommended yearly ophthalmology/optometry visit for glaucoma screening and checkup Recommended yearly dental visit for hygiene and checkup  Vaccinations: Influenza vaccine up to date. Due this upcoming season Pneumococcal vaccine up to date Tdap vaccine due, ordered Shingles vaccine up to date.  Advanced directives: In Chart  Conditions/risks identified: None   Next appointment: 04/03/17 Dr. Mariea Clonts 3:30pm   Preventive Care 47 Years and Older, Female Preventive care refers to lifestyle choices and visits with your health care provider that can promote health and wellness. What does preventive care include?  A yearly physical exam. This is also called an annual well check.  Dental exams once or twice a year.  Routine eye exams. Ask your health care provider how often you should have your eyes checked.  Personal lifestyle choices, including:  Daily care of your teeth and gums.  Regular physical activity.  Eating a healthy diet.  Avoiding tobacco and drug use.  Limiting alcohol use.  Practicing safe sex.  Taking low-dose aspirin every day.  Taking vitamin and mineral supplements as recommended by your health care provider. What happens during an annual well check? The services and screenings done by your health care provider during your annual well check will depend on your age, overall health, lifestyle risk factors, and family history of disease. Counseling  Your health care provider may ask you questions about your:  Alcohol use.  Tobacco use.  Drug use.  Emotional well-being.  Home and  relationship well-being.  Sexual activity.  Eating habits.  History of falls.  Memory and ability to understand (cognition).  Work and work Statistician.  Reproductive health. Screening  You may have the following tests or measurements:  Height, weight, and BMI.  Blood pressure.  Lipid and cholesterol levels. These may be checked every 5 years, or more frequently if you are over 47 years old.  Skin check.  Lung cancer screening. You may have this screening every year starting at age 55 if you have a 30-pack-year history of smoking and currently smoke or have quit within the past 15 years.  Fecal occult blood test (FOBT) of the stool. You may have this test every year starting at age 71.  Flexible sigmoidoscopy or colonoscopy. You may have a sigmoidoscopy every 5 years or a colonoscopy every 10 years starting at age 19.  Hepatitis C blood test.  Hepatitis B blood test.  Sexually transmitted disease (STD) testing.  Diabetes screening. This is done by checking your blood sugar (glucose) after you have not eaten for a while (fasting). You may have this done every 1-3 years.  Bone density scan. This is done to screen for osteoporosis. You may have this done starting at age 74.  Mammogram. This may be done every 1-2 years. Talk to your health care provider about how often you should have regular mammograms. Talk with your health care provider about your test results, treatment options, and if necessary, the need for more tests. Vaccines  Your health care provider may recommend certain vaccines, such as:  Influenza vaccine. This is recommended every year.  Tetanus, diphtheria, and acellular pertussis (Tdap,  Td) vaccine. You may need a Td booster every 10 years.  Zoster vaccine. You may need this after age 31.  Pneumococcal 13-valent conjugate (PCV13) vaccine. One dose is recommended after age 13.  Pneumococcal polysaccharide (PPSV23) vaccine. One dose is recommended after  age 49. Talk to your health care provider about which screenings and vaccines you need and how often you need them. This information is not intended to replace advice given to you by your health care provider. Make sure you discuss any questions you have with your health care provider. Document Released: 09/09/2015 Document Revised: 05/02/2016 Document Reviewed: 06/14/2015 Elsevier Interactive Patient Education  2017 [place] Prevention in the Home Falls can cause injuries. They can happen to people of all ages. There are many things you can do to make your home safe and to help prevent falls. What can I do on the outside of my home?  Regularly fix the edges of walkways and driveways and fix any cracks.  Remove anything that might make you trip as you walk through a door, such as a raised step or threshold.  Trim any bushes or trees on the path to your home.  Use bright outdoor lighting.  Clear any walking paths of anything that might make someone trip, such as rocks or tools.  Regularly check to see if handrails are loose or broken. Make sure that both sides of any steps have handrails.  Any raised decks and porches should have guardrails on the edges.  Have any leaves, snow, or ice cleared regularly.  Use sand or salt on walking paths during winter.  Clean up any spills in your garage right away. This includes oil or grease spills. What can I do in the bathroom?  Use night lights.  Install grab bars by the toilet and in the tub and shower. Do not use towel bars as grab bars.  Use non-skid mats or decals in the tub or shower.  If you need to sit down in the shower, use a plastic, non-slip stool.  Keep the floor dry. Clean up any water that spills on the floor as soon as it happens.  Remove soap buildup in the tub or shower regularly.  Attach bath mats securely with double-sided non-slip rug tape.  Do not have throw rugs and other things on the floor that can  make you trip. What can I do in the bedroom?  Use night lights.  Make sure that you have a light by your bed that is easy to reach.  Do not use any sheets or blankets that are too big for your bed. They should not hang down onto the floor.  Have a firm chair that has side arms. You can use this for support while you get dressed.  Do not have throw rugs and other things on the floor that can make you trip. What can I do in the kitchen?  Clean up any spills right away.  Avoid walking on wet floors.  Keep items that you use a lot in easy-to-reach places.  If you need to reach something above you, use a strong step stool that has a grab bar.  Keep electrical cords out of the way.  Do not use floor polish or wax that makes floors slippery. If you must use wax, use non-skid floor wax.  Do not have throw rugs and other things on the floor that can make you trip. What can I do with my stairs?  Do not  leave any items on the stairs.  Make sure that there are handrails on both sides of the stairs and use them. Fix handrails that are broken or loose. Make sure that handrails are as long as the stairways.  Check any carpeting to make sure that it is firmly attached to the stairs. Fix any carpet that is loose or worn.  Avoid having throw rugs at the top or bottom of the stairs. If you do have throw rugs, attach them to the floor with carpet tape.  Make sure that you have a light switch at the top of the stairs and the bottom of the stairs. If you do not have them, ask someone to add them for you. What else can I do to help prevent falls?  Wear shoes that:  Do not have high heels.  Have rubber bottoms.  Are comfortable and fit you well.  Are closed at the toe. Do not wear sandals.  If you use a stepladder:  Make sure that it is fully opened. Do not climb a closed stepladder.  Make sure that both sides of the stepladder are locked into place.  Ask someone to hold it for you,  if possible.  Clearly mark and make sure that you can see:  Any grab bars or handrails.  First and last steps.  Where the edge of each step is.  Use tools that help you move around (mobility aids) if they are needed. These include:  Canes.  Walkers.  Scooters.  Crutches.  Turn on the lights when you go into a dark area. Replace any light bulbs as soon as they burn out.  Set up your furniture so you have a clear path. Avoid moving your furniture around.  If any of your floors are uneven, fix them.  If there are any pets around you, be aware of where they are.  Review your medicines with your doctor. Some medicines can make you feel dizzy. This can increase your chance of falling. Ask your doctor what other things that you can do to help prevent falls. This information is not intended to replace advice given to you by your health care provider. Make sure you discuss any questions you have with your health care provider. Document Released: 06/09/2009 Document Revised: 01/19/2016 Document Reviewed: 09/17/2014 Elsevier Interactive Patient Education  2017 [employer].

## 2017-03-05 NOTE — Progress Notes (Signed)
Subjective:   Jillian Norton is a 76 y.o. female who presents for an Initial Medicare Annual Wellness Visit at [facility] clinic- independent living pt       Objective:    Today's Vitals   03/05/17 1527  BP: 120/60  Pulse: 69  Temp: 98 F (36.7 C)  TempSrc: Oral  SpO2: 97%  Weight: 178 lb (80.7 kg)  Height: 5\' 4"  (1.626 m)   Body mass index is 30.55 kg/m.   Current Medications (verified) Outpatient Encounter Prescriptions as of 03/05/2017  Medication Sig  . acetaminophen (TYLENOL) 325 MG tablet Take 650 mg by mouth as needed. Pt stated, "When I have pain, takes 3 tablets at a time for the pain; it does help"  . Diclofenac Sodium (VOLTAREN EX) Apply topically as needed.  Marland Kitchen omeprazole (PRILOSEC) 40 MG capsule Take 1 capsule (40 mg total) by mouth daily.  . solifenacin (VESICARE) 5 MG tablet Take 5 mg by mouth daily.  . Tdap (BOOSTRIX) 5-2.5-18.5 LF-MCG/0.5 injection Inject 0.5 mLs into the muscle once.  Marland Kitchen zolpidem (AMBIEN) 10 MG tablet Take 5 mg by mouth at bedtime as needed for sleep.  . [DISCONTINUED] solifenacin (VESICARE) 10 MG tablet Take 10 mg by mouth daily.   No facility-administered encounter medications on file as of 03/05/2017.     Allergies (verified) Sporanos [itraconazole]   History: Past Medical History:  Diagnosis Date  . Anxiety   . Breast cancer ([place]) 06/27/2012   Right  . Cataract   . DVT (deep venous thrombosis) ([place]) 06/28/2015  . Dyslipidemia   . GERD (gastroesophageal reflux disease)   . Insomnia   . Osteopenia   . Overactive bladder   . Shingles 06/2016  . Thyroid disease    Past Surgical History:  Procedure Laterality Date  . BREAST LUMPECTOMY Right 2014  . BREAST LUMPECTOMY  2013   right  . CATARACT EXTRACTION W/ INTRAOCULAR LENS IMPLANT Bilateral   . COLONOSCOPY  08/27/2012   [place]  . KNEE ARTHROSCOPY     left  . LASIK Bilateral   . VAGINAL HYSTERECTOMY  2002   Family History  Problem Relation Age of Onset  . Lung  cancer Mother   . Colon cancer Father    Social History   Occupational History  . Not on file.   Social History Main Topics  . Smoking status: Former Smoker    Packs/day: 1.00    Years: 10.00    Types: Cigarettes  . Smokeless tobacco: Never Used  . Alcohol use Yes     Comment: 1-2 drinks per day  . Drug use: No  . Sexual activity: Not on file    Tobacco Counseling Counseling given: Not Answered   Activities of Daily Living In your present state of health, do you have any difficulty performing the following activities: 03/05/2017  Hearing? N  Vision? N  Difficulty concentrating or making decisions? Y  Walking or climbing stairs? N  Dressing or bathing? N  Doing errands, shopping? N  Preparing Food and eating ? N  Using the Toilet? N  In the past six months, have you accidently leaked urine? N  Do you have problems with loss of bowel control? N  Managing your Medications? N  Managing your Finances? N  Housekeeping or managing your Housekeeping? N  Some recent data might be hidden    Immunizations and Health Maintenance Immunization History  Administered Date(s) Administered  . Influenza-Unspecified 08/27/2016  . Pneumococcal Conjugate-13 09/27/2014  . Pneumococcal Polysaccharide-23  08/27/2010  . Pneumococcal-Unspecified 08/28/2015  . Zoster 08/27/2012   Health Maintenance Due  Topic Date Due  . Samul Dada  10/09/1959    Patient Care Team: Gayland Curry, DO as PCP - General (Geriatric Medicine)  Indicate any recent Medical Services you may have received from other than [facility] providers in the past year (date may be approximate).     Assessment:   This is a routine wellness examination for [facility].   Hearing/Vision screen No exam data present  Dietary issues and exercise activities discussed: Current Exercise Habits: Home exercise routine, Type of exercise: Other - see comments (chair fit), Time (Minutes): 40, Frequency (Times/Week): 4, Weekly Exercise  (Minutes/Week): 160, Intensity: Mild  Goals    . Maintain Lifestyle          Pt will maintain lifestyle.       Depression Screen PHQ 2/9 Scores 03/05/2017 01/02/2017 11/28/2016 11/01/2016  PHQ - 2 Score 0 0 0 0    Fall Risk Fall Risk  03/05/2017 01/02/2017 11/28/2016 11/01/2016  Falls in the past year? No No No No    Cognitive Function: MMSE - Mini Mental State Exam 03/05/2017  Orientation to time 4  Orientation to Place 5  Registration 3  Attention/ Calculation 5  Recall 2  Language- name 2 objects 2  Language- repeat 1  Language- follow 3 step command 3  Language- read & follow direction 1  Write a sentence 1  Copy design 1  Total score 28        Screening Tests Health Maintenance  Topic Date Due  . TETANUS/TDAP  10/09/1959  . INFLUENZA VACCINE  03/27/2017  . DEXA SCAN  Completed  . PNA vac Low Risk Adult  Completed      Plan:    I have personally reviewed and addressed the Medicare Annual Wellness questionnaire and have noted the following in the patient's chart:  A. Medical and social history B. Use of alcohol, tobacco or illicit drugs  C. Current medications and supplements D. Functional ability and status E.  Nutritional status F.  Physical activity G. Advance directives H. List of other physicians I.  Hospitalizations, surgeries, and ER visits in previous 12 months J.  [place] to include hearing, vision, cognitive, depression L. Referrals and appointments - none  In addition, I have reviewed and discussed with patient certain preventive protocols, quality metrics, and best practice recommendations. A written personalized care plan for preventive services as well as general preventive health recommendations were provided to patient.  See attached scanned questionnaire for additional information.   Signed,   Rich Reining, RN Nurse Health Advisor   Quick Notes   Health Maintenance: TDAP ordered     Abnormal Screen: MMSE 28/30. Passed  clock drawing     Patient Concerns: None     Nurse Concerns: None

## 2017-03-06 ENCOUNTER — Encounter: Payer: Medicare Other | Admitting: Internal Medicine

## 2017-03-14 ENCOUNTER — Encounter: Payer: Self-pay | Admitting: Internal Medicine

## 2017-03-14 MED ORDER — ZOLPIDEM TARTRATE 10 MG PO TABS: 5.0000 mg | ORAL_TABLET | Freq: Every evening | ORAL | 0 refills | Status: DC | PRN

## 2017-03-14 NOTE — Telephone Encounter (Signed)
Ok to fill 

## 2017-03-14 NOTE — Telephone Encounter (Signed)
rx called into pharmacy

## 2017-03-18 ENCOUNTER — Other Ambulatory Visit: Payer: Self-pay | Admitting: *Deleted

## 2017-03-18 MED ORDER — ZOLPIDEM TARTRATE 10 MG PO TABS: 10.0000 mg | ORAL_TABLET | Freq: Every evening | ORAL | 0 refills | Status: DC | PRN

## 2017-04-03 ENCOUNTER — Non-Acute Institutional Stay: Payer: Medicare Other | Admitting: Internal Medicine

## 2017-04-03 VITALS — BP 138/78 | HR 70 | Temp 97.4°F | Wt 173.0 lb

## 2017-04-03 DIAGNOSIS — N644 Mastodynia: Secondary | ICD-10-CM

## 2017-04-03 DIAGNOSIS — Z86718 Personal history of other venous thrombosis and embolism: Secondary | ICD-10-CM | POA: Diagnosis not present

## 2017-04-03 DIAGNOSIS — Z853 Personal history of malignant neoplasm of breast: Secondary | ICD-10-CM

## 2017-04-03 DIAGNOSIS — M79661 Pain in right lower leg: Secondary | ICD-10-CM | POA: Diagnosis not present

## 2017-04-03 DIAGNOSIS — F5101 Primary insomnia: Secondary | ICD-10-CM | POA: Diagnosis not present

## 2017-04-03 DIAGNOSIS — E663 Overweight: Secondary | ICD-10-CM | POA: Diagnosis not present

## 2017-04-03 DIAGNOSIS — M7989 Other specified soft tissue disorders: Secondary | ICD-10-CM

## 2017-04-04 ENCOUNTER — Encounter: Payer: Self-pay | Admitting: Internal Medicine

## 2017-04-04 NOTE — Progress Notes (Signed)
Location:  [facility]   Place of Service:  Clinic (12) Date of Service 04/03/17 (but [facility] was down at time of appt so note not opened at the time)  Provider: Angela Platner L. Mariea Clonts, D.O., C.M.D.  Code Status: DNR Goals of Care:  Advanced Directives 03/05/2017  Does Patient Have a Medical Advance Directive? Yes  Type of Paramedic of [place];Living will;Out of facility DNR (pink MOST or yellow form)  Does patient want to make changes to medical advance directive? No - Patient declined  Copy of [place] in Chart? Yes  Pre-existing out of facility DNR order (yellow form or pink MOST form) Pink MOST form placed in chart (order not valid for inpatient use)   Chief Complaint  Patient presents with  . Medical Management of Chronic Issues    6mth follow-up    HPI: Patient is a 76 y.o. female with h/o right breast cancer in 2013 s/p radiation and lumpectomy, prior right LE DVT, overweight, shingles, GERD, urge incontinence seen today for an acute visit for right breast tenderness and right calf pain.    Right breast tenderness, h/o breast cancer with XRT right breast. Had SAVI procedure with radiation then removal. She has known scar tissue in that area.  Her diagnostic mammo in January was clear.  She had not been having any pain in that area until recently.  She admits that when she swings a golf club, her right arm comes across that area of her breast and could contribute.  She does not have an oncologist here.  She's just under 5 years out from her breast cancer (11/13).  It appears I still have not received records on her from her prior PCP or specialists.    Right calf pain, h/o DVT that had cleared after 6 mos eliquis.  She told me when she was establishing that it was NOT correlated with her breast cancer or any tamoxifen/anastrazole use.  She has had a slightly larger right leg, but it's become painful lately.  It's been bothering her at bedtime "off  and on for a while" which is actually a few months but she's forgotten to bring it up to me.  No shortness of breath.    Weight management:  She has restarted the low dose of contrave which she could tolerate.  She did not like the high dose as it made her jittery and she absolutely could not sleep at night.  She is sticking to a healthy diet and exercise plan and thinks it has helped decrease her appetite.  She has lost 5 lbs since July 10th.    She's had a lot of stress recently with her first ex-husband being acutely ill from CHF.  Apparently, he passed out when he was not at home and was in intensive care far from home, then transferred back to [facility] where he lives.  She had planned a reunion type event for his bday for the family which had to be canceled and now she's having difficulty getting it refunded.  She's also had to deal with his current wife who is not acknowledging how ill he is.  Due to these things, she is adamant she needs the 10mg  Lorrin Mais though she does listen to me when I explain the reasons I don't recommend she use that dose long term as she ages.    Past Medical History:  Diagnosis Date  . Anxiety   . Breast cancer ([place]) 06/27/2012   Right  .  Cataract   . DVT (deep venous thrombosis) ([place]) 06/28/2015  . Dyslipidemia   . GERD (gastroesophageal reflux disease)   . Insomnia   . Osteopenia   . Overactive bladder   . Shingles 06/2016  . Thyroid disease     Past Surgical History:  Procedure Laterality Date  . BREAST LUMPECTOMY Right 2014  . BREAST LUMPECTOMY  2013   right  . CATARACT EXTRACTION W/ INTRAOCULAR LENS IMPLANT Bilateral   . COLONOSCOPY  08/27/2012   [place]  . KNEE ARTHROSCOPY     left  . LASIK Bilateral   . VAGINAL HYSTERECTOMY  2002    Allergies  Allergen Reactions  . Sporanos [Itraconazole] Anaphylaxis    Allergies as of 04/03/2017      Reactions   Sporanos [itraconazole] Anaphylaxis      Medication List       Accurate as of 04/03/17  11:59 PM. Always use your most recent med list.          acetaminophen 325 MG tablet Commonly known as:  TYLENOL Take 650 mg by mouth as needed. Pt stated, "When I have pain, takes 3 tablets at a time for the pain; it does help"   CONTRAVE 8-90 MG Tb12 Generic drug:  Naltrexone-Bupropion HCl ER Take 1 tablet by mouth daily.   omeprazole 40 MG capsule Commonly known as:  PRILOSEC Take 1 capsule (40 mg total) by mouth daily.   solifenacin 5 MG tablet Commonly known as:  VESICARE Take 5 mg by mouth daily.   VOLTAREN EX Apply topically as needed.   zolpidem 10 MG tablet Commonly known as:  AMBIEN Take 1 tablet (10 mg total) by mouth at bedtime as needed for sleep.       Review of Systems:  Review of Systems  Constitutional: Negative for chills, fever and malaise/fatigue.       Right breast tenderness  HENT: Negative for congestion and hearing loss.   Eyes: Negative for blurred vision.  Respiratory: Negative for cough and shortness of breath.   Cardiovascular: Positive for leg swelling. Negative for chest pain and palpitations.       R>LLE  Gastrointestinal: Negative for abdominal pain.  Genitourinary: Negative for dysuria.  Musculoskeletal: Positive for myalgias. Negative for falls.  Neurological: Negative for dizziness, loss of consciousness and weakness.  Endo/Heme/Allergies: Does not bruise/bleed easily.  Psychiatric/Behavioral: Positive for depression. Negative for memory loss and suicidal ideas. The patient is nervous/anxious and has insomnia.     Health Maintenance  Topic Date Due  . TETANUS/TDAP  10/09/1959  . INFLUENZA VACCINE  03/27/2017  . DEXA SCAN  Completed  . PNA vac Low Risk Adult  Completed    Physical Exam: Vitals:   04/03/17 0330  BP: 138/78  Pulse: 70  Temp: (!) 97.4 F (36.3 C)  TempSrc: Oral  SpO2: 99%  Weight: 173 lb (78.5 kg)   Body mass index is 29.7 kg/m. Physical Exam  Constitutional: She is oriented to person, place, and  time. She appears well-developed and well-nourished. No distress.  Cardiovascular: Normal rate, regular rhythm, normal heart sounds and intact distal pulses.   Pulmonary/Chest: Effort normal and breath sounds normal. No respiratory distress. Right breast exhibits mass and tenderness. Right breast exhibits no inverted nipple, no nipple discharge and no skin change. Left breast exhibits no inverted nipple, no mass, no nipple discharge, no skin change and no tenderness.  Right breast with upper outer quadrant hard mass beneath scar from radiation procedure and lumpectomy, is tender  in that area, no axillary lymphadenopathy noted  Abdominal: Soft. Bowel sounds are normal.  Musculoskeletal: Normal range of motion.  RLE is slightly larger than left, no pitting edema, is tender in the posterior popliteal space and into lateral right calf  Neurological: She is alert and oriented to person, place, and time.  Skin: Skin is warm and dry.  Psychiatric: She has a normal mood and affect.    Labs reviewed: Basic Metabolic Panel:  Recent Labs  11/01/16 1031  NA 138  K 4.8  CL 104  CO2 27  GLUCOSE 88  BUN 21  CREATININE 0.77  CALCIUM 9.5  TSH 2.95   Liver Function Tests:  Recent Labs  11/01/16 1031  AST 13  ALT 16  ALKPHOS 42  BILITOT 0.4  PROT 6.8  ALBUMIN 4.3   No results for input(s): LIPASE, AMYLASE in the last 8760 hours. No results for input(s): AMMONIA in the last 8760 hours. CBC:  Recent Labs  11/01/16 1031  WBC 3.6*  NEUTROABS 2,196  HGB 13.4  HCT 40.2  MCV 94.8  PLT 234   Assessment/Plan 1. Breast tenderness in female - right breast with h/o breast cancer s/p XRT with SAVI procedure and lumpectomy - MM DIAG BREAST TOMO BILATERAL; Future, consider ultrasound also if needed  2. History of cancer of right breast - see above, I don't have records only new pt packet info - MM DIAG BREAST TOMO BILATERAL; Future  3. History of DVT (deep vein thrombosis) - RLE that  was said to have cleared after 6 mos eliquis and now off anticoagulants - VAS [place] LOWER EXTREMITY VENOUS (DVT); Future  4. Pain and swelling of lower leg, right - see#3 - VAS [place] LOWER EXTREMITY VENOUS (DVT); Future  5. Overweight (BMI 25.0-29.9) -down 5 lbs with diet, exercise and contrave low dose -cont same routine  6. 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 MM DIAG BREAST TOMO BILATERAL    CO-SIGN REQ 8/10(WRONG EPIC ORDER) CR     Standing Status:   Future    Standing Expiration Date:   06/05/2018    Order Specific Question:   Reason for Exam (SYMPTOM  OR DIAGNOSIS REQUIRED)    Answer:   right upper outer quadrant tenderness near incision site from prior radiation procedure; has had thickening of tissue there longterm but tenderness new    Order Specific Question:   Preferred imaging location?    Answer:   [facility]    Next appt:  4 mos   Celester Morgan L. Heinz Eckert, D.O. [facility] [address], [place] [address] Cell Phone (Mon-Fri 8am-5pm):  [phone] On Call:  [phone] & follow prompts after 5pm & weekends Office Phone:  [phone] Office Fax:  [phone]

## 2017-04-05 ENCOUNTER — Other Ambulatory Visit: Payer: Self-pay | Admitting: Internal Medicine

## 2017-04-05 DIAGNOSIS — N644 Mastodynia: Secondary | ICD-10-CM

## 2017-04-05 DIAGNOSIS — Z853 Personal history of malignant neoplasm of breast: Secondary | ICD-10-CM

## 2017-04-07 DIAGNOSIS — Z853 Personal history of malignant neoplasm of breast: Secondary | ICD-10-CM | POA: Insufficient documentation

## 2017-04-07 DIAGNOSIS — E663 Overweight: Secondary | ICD-10-CM | POA: Insufficient documentation

## 2017-04-07 DIAGNOSIS — Z86718 Personal history of other venous thrombosis and embolism: Secondary | ICD-10-CM | POA: Insufficient documentation

## 2017-04-10 ENCOUNTER — Ambulatory Visit (HOSPITAL_COMMUNITY)
Admission: RE | Admit: 2017-04-10 | Discharge: 2017-04-10 | Disposition: A | Discharge status: Home / Self Care | Payer: Medicare Other | Source: Ambulatory Visit | Attending: Vascular Surgery | Admitting: Vascular Surgery

## 2017-04-10 DIAGNOSIS — M79661 Pain in right lower leg: Secondary | ICD-10-CM | POA: Insufficient documentation

## 2017-04-10 DIAGNOSIS — Z86718 Personal history of other venous thrombosis and embolism: Secondary | ICD-10-CM | POA: Diagnosis not present

## 2017-04-10 DIAGNOSIS — M7989 Other specified soft tissue disorders: Secondary | ICD-10-CM | POA: Insufficient documentation

## 2017-04-11 ENCOUNTER — Telehealth: Payer: Self-pay | Admitting: *Deleted

## 2017-04-11 NOTE — Telephone Encounter (Signed)
Spoke with daughter and advised results.  

## 2017-04-11 NOTE — Telephone Encounter (Signed)
.  left message to have patient return my call.   Called to advise to patient that her doppler came back and showed no DVT.

## 2017-04-22 ENCOUNTER — Ambulatory Visit
Admission: RE | Admit: 2017-04-22 | Discharge: 2017-04-22 | Disposition: A | Discharge status: Home / Self Care | Payer: Medicare Other | Source: Ambulatory Visit | Attending: Internal Medicine | Admitting: Internal Medicine

## 2017-04-22 ENCOUNTER — Other Ambulatory Visit: Payer: Self-pay

## 2017-04-22 ENCOUNTER — Other Ambulatory Visit: Payer: Self-pay | Admitting: Internal Medicine

## 2017-04-22 DIAGNOSIS — N644 Mastodynia: Secondary | ICD-10-CM

## 2017-04-22 DIAGNOSIS — Z853 Personal history of malignant neoplasm of breast: Secondary | ICD-10-CM

## 2017-04-22 DIAGNOSIS — R922 Inconclusive mammogram: Secondary | ICD-10-CM | POA: Diagnosis not present

## 2017-04-22 HISTORY — DX: Personal history of irradiation: Z92.3

## 2017-05-02 ENCOUNTER — Other Ambulatory Visit: Payer: Self-pay | Admitting: Internal Medicine

## 2017-05-03 MED ORDER — ZOLPIDEM TARTRATE 10 MG PO TABS
10.0000 mg | ORAL_TABLET | Freq: Every evening | ORAL | 0 refills | Status: DC | PRN
Start: 2017-05-03 — End: 2017-06-16

## 2017-05-03 NOTE — Telephone Encounter (Signed)
A prescription refill request was received via mychart for ambian 10 mg tablets. Rx was called in to pharmacy for #30 with no RF.

## 2017-05-24 ENCOUNTER — Telehealth: Payer: Self-pay | Admitting: *Deleted

## 2017-05-24 NOTE — Telephone Encounter (Signed)
Pt walked in stating that she has been lightheaded, sweating, chills and shakey since Tuesday off and on. Pt states she's been drinking plenty of water. Pt was trying to be seen by [facility] but they where closed. Pt is on Jessica's schedule for Monday. Per verbal with Dr. Eulas Post, pt should get plenty of rest and keep appt on Monday.

## 2017-05-27 ENCOUNTER — Encounter: Payer: Self-pay | Admitting: Nurse Practitioner

## 2017-05-27 ENCOUNTER — Ambulatory Visit (INDEPENDENT_AMBULATORY_CARE_PROVIDER_SITE_OTHER): Payer: Medicare Other | Admitting: Nurse Practitioner

## 2017-05-27 VITALS — BP 102/78 | HR 69 | Temp 97.9°F | Resp 17 | Ht 64.0 in | Wt 178.4 lb

## 2017-05-27 DIAGNOSIS — R5383 Other fatigue: Secondary | ICD-10-CM | POA: Diagnosis not present

## 2017-05-27 DIAGNOSIS — Z23 Encounter for immunization: Secondary | ICD-10-CM | POA: Diagnosis not present

## 2017-05-27 NOTE — Progress Notes (Signed)
Careteam: Patient Care Team: Gayland Curry, DO as PCP - General (Geriatric Medicine)  Advanced Directive information Does Patient Have a Medical Advance Directive?: Yes, Type of Advance Directive: [place];Living will;Out of facility DNR (pink MOST or yellow form), Pre-existing out of facility DNR order (yellow form or pink MOST form): Yellow form placed in chart (order not valid for inpatient use)  Allergies  Allergen Reactions  . [place] [Itraconazole] Anaphylaxis    Chief Complaint  Patient presents with  . Acute Visit    Pt is being seen due to fatigue, joint pain, sweating/chill episodes, lightheadness, and shaky feeling last week. Pt reports symptoms are better today     HPI: Patient is a 76 y.o. female seen in the office today due to fatigue with sweats.  1 week ago started to have extreme fatigue with chills and sweats and then joints would hurt then felt like her HR would go up then it would be okay.  No headache, visual changes, cough, congestion, no fevers but felt like she had the flu without the flu. No chest pains. Some nervousness now.  Called Kim the [facility] nurse who recommended a visit.  Started to get her energy back over the last few days. No more sweats. No chills or body aches. Feels better.  Had 18 holes of golf that she played 3 days ago and did it without any problems.   No sick contacts but lives in [facility] and in the dinning room frequently with contacts.    Review of Systems:  Review of Systems  Constitutional: Negative for chills, fever, malaise/fatigue and weight loss.  HENT: Negative for congestion, ear pain, sinus pain and sore throat.   Respiratory: Negative for cough, sputum production and shortness of breath.   Cardiovascular: Negative for chest pain, palpitations and leg swelling.  Gastrointestinal: Negative for constipation and diarrhea.  Genitourinary: Positive for urgency (baseline). Negative for  dysuria and frequency.  Musculoskeletal: Positive for joint pain (joint pain better). Negative for back pain and myalgias.  Skin: Negative.   Neurological: Negative for dizziness, weakness and headaches.  Psychiatric/Behavioral: Negative for depression and memory loss. The patient is nervous/anxious and has insomnia (sleeps well with ambien ).     Past Medical History:  Diagnosis Date  . Anxiety   . Breast cancer ([place]) 06/27/2012   Right  . Cataract   . DVT (deep venous thrombosis) ([place]) 06/28/2015  . Dyslipidemia   . GERD (gastroesophageal reflux disease)   . Insomnia   . Osteopenia   . Overactive bladder   . Personal history of radiation therapy   . Shingles 06/2016  . Thyroid disease    Past Surgical History:  Procedure Laterality Date  . BREAST LUMPECTOMY Right 2014  . BREAST LUMPECTOMY  2013   right  . CATARACT EXTRACTION W/ INTRAOCULAR LENS IMPLANT Bilateral   . COLONOSCOPY  08/27/2012   [place]  . KNEE ARTHROSCOPY     left  . LASIK Bilateral   . REDUCTION MAMMAPLASTY Bilateral 1985   anchor scar   . VAGINAL HYSTERECTOMY  2002   Social History:   reports that she has quit smoking. Her smoking use included Cigarettes. She has a 10.00 pack-year smoking history. She has never used smokeless tobacco. She reports that she drinks alcohol. She reports that she does not use drugs.  Family History  Problem Relation Age of Onset  . Lung cancer Mother   . Colon cancer Father  Medications: Patient's Medications  New Prescriptions   No medications on file  Previous Medications   ACETAMINOPHEN (TYLENOL) 325 MG TABLET    Take 650 mg by mouth as needed. Pt stated, "When I have pain, takes 3 tablets at a time for the pain; it does help"   DICLOFENAC SODIUM (VOLTAREN EX)    Apply topically as needed.   OMEPRAZOLE (PRILOSEC) 40 MG CAPSULE    Take 1 capsule (40 mg total) by mouth daily.   SOLIFENACIN (VESICARE) 5 MG TABLET    Take 5 mg by mouth daily.   ZOLPIDEM (AMBIEN) 10  MG TABLET    Take 1 tablet (10 mg total) by mouth at bedtime as needed for sleep.  Modified Medications   No medications on file  Discontinued Medications   NALTREXONE-BUPROPION HCL ER (CONTRAVE) 8-90 MG TB12    Take 1 tablet by mouth daily.     Physical Exam:  Vitals:   05/27/17 1340  BP: 102/78  Pulse: 69  Resp: 17  Temp: 97.9 F (36.6 C)  TempSrc: Oral  SpO2: 98%  Weight: 178 lb 6.4 oz (80.9 kg)  Height: 5' 4"  (1.626 m)   Body mass index is 30.62 kg/m.  Physical Exam  Constitutional: She is oriented to person, place, and time. She appears well-developed and well-nourished. No distress.  HENT:  Head: Normocephalic and atraumatic.  Nose: Nose normal.  Mouth/Throat: Oropharynx is clear and moist. No oropharyngeal exudate.  Eyes: Pupils are equal, round, and reactive to light. Conjunctivae are normal.  Neck: Normal range of motion. Neck supple.  Cardiovascular: Normal rate, regular rhythm and normal heart sounds.   Pulmonary/Chest: Effort normal and breath sounds normal.  Abdominal: Soft. Bowel sounds are normal.  Musculoskeletal: She exhibits no edema or tenderness.  Neurological: She is alert and oriented to person, place, and time.  Skin: Skin is warm and dry. She is not diaphoretic.  Psychiatric: She has a normal mood and affect.    Labs reviewed: Basic Metabolic Panel:  Recent Labs  11/01/16 1031  NA 138  K 4.8  CL 104  CO2 27  GLUCOSE 88  BUN 21  CREATININE 0.77  CALCIUM 9.5  TSH 2.95   Liver Function Tests:  Recent Labs  11/01/16 1031  AST 13  ALT 16  ALKPHOS 42  BILITOT 0.4  PROT 6.8  ALBUMIN 4.3   No results for input(s): LIPASE, AMYLASE in the last 8760 hours. No results for input(s): AMMONIA in the last 8760 hours. CBC:  Recent Labs  11/01/16 1031  WBC 3.6*  NEUTROABS 2,196  HGB 13.4  HCT 40.2  MCV 94.8  PLT 234   Lipid Panel: No results for input(s): CHOL, HDL, LDLCALC, TRIG, CHOLHDL, LDLDIRECT in the last 8760  hours. TSH:  Recent Labs  11/01/16 1031  TSH 2.95   A1C: No results found for: HGBA1C   Assessment/Plan 1. Other fatigue -overall doing better. Will follow up lab work at this time. -cont to stay properly hydrated with good nutrition.  - CBC with Differential/Platelets - CMP with eGFR - TSH  2. Need for immunization against influenza - Flu vaccine HIGH DOSE PF (Fluzone High dose)  Next appt: 3 months with Dr Sharee Holster K. Harle Battiest  [facility] [phone] 8 am - 5 pm) [phone] (after hours)

## 2017-05-27 NOTE — Telephone Encounter (Signed)
Pt was seen today.

## 2017-05-28 LAB — COMPLETE METABOLIC PANEL WITH GFR
AG RATIO: 2 (calc) (ref 1.0–2.5)
ALKALINE PHOSPHATASE (APISO): 43 U/L (ref 33–130)
ALT: 28 U/L (ref 6–29)
AST: 16 U/L (ref 10–35)
Albumin: 4.4 g/dL (ref 3.6–5.1)
BILIRUBIN TOTAL: 0.4 mg/dL (ref 0.2–1.2)
BUN: 14 mg/dL (ref 7–25)
CHLORIDE: 105 mmol/L (ref 98–110)
CO2: 27 mmol/L (ref 20–32)
Calcium: 9.4 mg/dL (ref 8.6–10.4)
Creat: 0.88 mg/dL (ref 0.60–0.93)
GFR, Est African American: 74 mL/min/{1.73_m2} (ref >=60)
GFR, Est Non African American: 64 mL/min/{1.73_m2} (ref >=60)
GLUCOSE: 117 mg/dL (ref 65–139)
Globulin: 2.2 g/dL (calc) (ref 1.9–3.7)
POTASSIUM: 4.4 mmol/L (ref 3.5–5.3)
Sodium: 140 mmol/L (ref 135–146)
TOTAL PROTEIN: 6.6 g/dL (ref 6.1–8.1)

## 2017-05-28 LAB — CBC WITH DIFFERENTIAL/PLATELET
BASOS PCT: 0.4 %
Basophils Absolute: 20 cells/uL (ref 0–200)
EOS ABS: 49 {cells}/uL (ref 15–500)
Eosinophils Relative: 1 %
HCT: 37 % (ref 35.0–45.0)
Hemoglobin: 12.6 g/dL (ref 11.7–15.5)
LYMPHS ABS: 2058 {cells}/uL (ref 850–3900)
MCH: 31.5 pg (ref 27.0–33.0)
MCHC: 34.1 g/dL (ref 32.0–36.0)
MCV: 92.5 fL (ref 80.0–100.0)
MONOS PCT: 3.1 %
MPV: 10.2 fL (ref 7.5–12.5)
NEUTROS ABS: 2622 {cells}/uL (ref 1500–7800)
NEUTROS PCT: 53.5 %
PLATELETS: 238 10*3/uL (ref 140–400)
RBC: 4 10*6/uL (ref 3.80–5.10)
RDW: 12.4 % (ref 11.0–15.0)
TOTAL LYMPHOCYTE: 42 %
WBC: 4.9 10*3/uL (ref 3.8–10.8)
WBCMIX: 152 {cells}/uL — AB (ref 200–950)

## 2017-05-28 LAB — TSH: TSH: 2.56 m[IU]/L (ref 0.40–4.50)

## 2017-06-05 DIAGNOSIS — J209 Acute bronchitis, unspecified: Secondary | ICD-10-CM | POA: Diagnosis not present

## 2017-06-09 DIAGNOSIS — J209 Acute bronchitis, unspecified: Secondary | ICD-10-CM | POA: Diagnosis not present

## 2017-06-16 ENCOUNTER — Other Ambulatory Visit: Payer: Self-pay | Admitting: Internal Medicine

## 2017-06-17 MED ORDER — ZOLPIDEM TARTRATE 10 MG PO TABS
10.0000 mg | ORAL_TABLET | Freq: Every evening | ORAL | 0 refills | Status: DC | PRN
Start: 2017-06-17 — End: 2017-08-08

## 2017-06-17 NOTE — Telephone Encounter (Signed)
Prescription was checked on [place] Database prior to refilling. Refill was called in to pharmacy.

## 2017-06-25 DIAGNOSIS — C4492 Squamous cell carcinoma of skin, unspecified: Secondary | ICD-10-CM | POA: Diagnosis not present

## 2017-06-25 DIAGNOSIS — D485 Neoplasm of uncertain behavior of skin: Secondary | ICD-10-CM | POA: Diagnosis not present

## 2017-06-25 DIAGNOSIS — D2272 Melanocytic nevi of left lower limb, including hip: Secondary | ICD-10-CM | POA: Diagnosis not present

## 2017-07-09 DIAGNOSIS — B999 Unspecified infectious disease: Secondary | ICD-10-CM | POA: Diagnosis not present

## 2017-07-25 ENCOUNTER — Other Ambulatory Visit: Payer: Self-pay | Admitting: *Deleted

## 2017-07-25 MED ORDER — OMEPRAZOLE 40 MG PO CPDR: 40.0000 mg | DELAYED_RELEASE_CAPSULE | Freq: Every day | ORAL | 3 refills | Status: DC

## 2017-07-25 NOTE — Telephone Encounter (Signed)
[facility].  

## 2017-07-26 ENCOUNTER — Other Ambulatory Visit: Payer: Self-pay | Admitting: *Deleted

## 2017-07-26 MED ORDER — OMEPRAZOLE 40 MG PO CPDR: 40.0000 mg | DELAYED_RELEASE_CAPSULE | Freq: Every day | ORAL | 3 refills | Status: DC

## 2017-07-26 NOTE — Telephone Encounter (Signed)
[facility].  

## 2017-08-08 ENCOUNTER — Other Ambulatory Visit: Payer: Self-pay | Admitting: Internal Medicine

## 2017-08-08 DIAGNOSIS — C44722 Squamous cell carcinoma of skin of right lower limb, including hip: Secondary | ICD-10-CM | POA: Diagnosis not present

## 2017-08-08 DIAGNOSIS — L905 Scar conditions and fibrosis of skin: Secondary | ICD-10-CM | POA: Diagnosis not present

## 2017-08-09 ENCOUNTER — Encounter: Payer: Self-pay | Admitting: Podiatry

## 2017-08-09 ENCOUNTER — Ambulatory Visit (INDEPENDENT_AMBULATORY_CARE_PROVIDER_SITE_OTHER): Payer: Medicare Other | Admitting: Podiatry

## 2017-08-09 DIAGNOSIS — B351 Tinea unguium: Secondary | ICD-10-CM

## 2017-08-09 MED ORDER — TERBINAFINE HCL 250 MG PO TABS: ORAL_TABLET | ORAL | 0 refills | Status: DC

## 2017-08-09 MED ORDER — ZOLPIDEM TARTRATE 10 MG PO TABS: 10.0000 mg | ORAL_TABLET | Freq: Every evening | ORAL | 0 refills | Status: DC | PRN

## 2017-08-09 NOTE — Telephone Encounter (Signed)
A refill request was received from pharmacy for zolpidem 10 mg tablets. Rx was called in to pharmacy after verifying last fill date, provider, and quantity on PMP [place].

## 2017-08-10 NOTE — Progress Notes (Signed)
Subjective:   Patient ID: Jillian Norton, female   DOB: 76 y.o.   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  Also begin topical medication

## 2017-08-12 ENCOUNTER — Ambulatory Visit (INDEPENDENT_AMBULATORY_CARE_PROVIDER_SITE_OTHER): Payer: Medicare Other

## 2017-08-12 DIAGNOSIS — B351 Tinea unguium: Secondary | ICD-10-CM

## 2017-08-13 DIAGNOSIS — N764 Abscess of vulva: Secondary | ICD-10-CM | POA: Diagnosis not present

## 2017-08-15 DIAGNOSIS — N764 Abscess of vulva: Secondary | ICD-10-CM | POA: Diagnosis not present

## 2017-09-03 NOTE — Progress Notes (Signed)
Pt presents with mycotic infection of nail hallux left  All other systems are negative  Laser therapy administered to affected nails and tolerated well. All safety precautions were in place. Re-appointed in 4 weeks for 2nd treatment

## 2017-09-09 ENCOUNTER — Ambulatory Visit (INDEPENDENT_AMBULATORY_CARE_PROVIDER_SITE_OTHER): Payer: Medicare Other | Admitting: Orthopaedic Surgery

## 2017-09-09 ENCOUNTER — Ambulatory Visit: Payer: Medicare Other

## 2017-09-10 DIAGNOSIS — Z23 Encounter for immunization: Secondary | ICD-10-CM | POA: Diagnosis not present

## 2017-09-10 DIAGNOSIS — D2272 Melanocytic nevi of left lower limb, including hip: Secondary | ICD-10-CM | POA: Diagnosis not present

## 2017-09-10 DIAGNOSIS — Z85820 Personal history of malignant melanoma of skin: Secondary | ICD-10-CM | POA: Diagnosis not present

## 2017-09-10 DIAGNOSIS — L821 Other seborrheic keratosis: Secondary | ICD-10-CM | POA: Diagnosis not present

## 2017-09-10 DIAGNOSIS — D225 Melanocytic nevi of trunk: Secondary | ICD-10-CM | POA: Diagnosis not present

## 2017-09-10 DIAGNOSIS — L720 Epidermal cyst: Secondary | ICD-10-CM | POA: Diagnosis not present

## 2017-09-10 DIAGNOSIS — L814 Other melanin hyperpigmentation: Secondary | ICD-10-CM | POA: Diagnosis not present

## 2017-09-10 DIAGNOSIS — L918 Other hypertrophic disorders of the skin: Secondary | ICD-10-CM | POA: Diagnosis not present

## 2017-09-13 ENCOUNTER — Ambulatory Visit: Payer: Medicare Other

## 2017-09-17 ENCOUNTER — Ambulatory Visit (INDEPENDENT_AMBULATORY_CARE_PROVIDER_SITE_OTHER): Payer: Medicare Other | Admitting: Podiatry

## 2017-09-17 DIAGNOSIS — G4709 Other insomnia: Secondary | ICD-10-CM | POA: Diagnosis not present

## 2017-09-17 DIAGNOSIS — K219 Gastro-esophageal reflux disease without esophagitis: Secondary | ICD-10-CM | POA: Diagnosis not present

## 2017-09-17 DIAGNOSIS — R3915 Urgency of urination: Secondary | ICD-10-CM | POA: Diagnosis not present

## 2017-09-17 DIAGNOSIS — M7060 Trochanteric bursitis, unspecified hip: Secondary | ICD-10-CM | POA: Diagnosis not present

## 2017-09-17 DIAGNOSIS — M654 Radial styloid tenosynovitis [de Quervain]: Secondary | ICD-10-CM | POA: Diagnosis not present

## 2017-09-17 DIAGNOSIS — B351 Tinea unguium: Secondary | ICD-10-CM

## 2017-09-17 DIAGNOSIS — F325 Major depressive disorder, single episode, in full remission: Secondary | ICD-10-CM | POA: Diagnosis not present

## 2017-09-17 DIAGNOSIS — Z86718 Personal history of other venous thrombosis and embolism: Secondary | ICD-10-CM | POA: Diagnosis not present

## 2017-09-17 DIAGNOSIS — E663 Overweight: Secondary | ICD-10-CM | POA: Diagnosis not present

## 2017-09-17 DIAGNOSIS — M859 Disorder of bone density and structure, unspecified: Secondary | ICD-10-CM | POA: Diagnosis not present

## 2017-09-17 DIAGNOSIS — Z6830 Body mass index (BMI) 30.0-30.9, adult: Secondary | ICD-10-CM | POA: Diagnosis not present

## 2017-09-17 DIAGNOSIS — C50919 Malignant neoplasm of unspecified site of unspecified female breast: Secondary | ICD-10-CM | POA: Diagnosis not present

## 2017-09-17 DIAGNOSIS — E7849 Other hyperlipidemia: Secondary | ICD-10-CM | POA: Diagnosis not present

## 2017-09-17 NOTE — Progress Notes (Signed)
Pt presents with mycotic infection of nail hallux left. Patient also states that she bumped her Lt 4th toe against a golf mat in her garage and it is bruised and sore.  Noted 4th left toe to have mild contusion on the medial distal end of the toe. She denies pain across the foot or at base of the 4th metatarsal. No displacement noted, minimal swelling. I advised her to continue to monitor and use coban to splint the toe and to control swelling.   All other systems are negative  Laser therapy administered to affected nails and tolerated well. All safety precautions were in place. Re-appointed in 4 weeks for 3rd treatment. She is to follow up sooner in regards to the bruised toe if there is no improvement

## 2017-09-18 ENCOUNTER — Ambulatory Visit (INDEPENDENT_AMBULATORY_CARE_PROVIDER_SITE_OTHER): Payer: Medicare Other | Admitting: Orthopaedic Surgery

## 2017-09-18 ENCOUNTER — Encounter (INDEPENDENT_AMBULATORY_CARE_PROVIDER_SITE_OTHER): Payer: Self-pay | Admitting: Orthopaedic Surgery

## 2017-09-18 DIAGNOSIS — G5601 Carpal tunnel syndrome, right upper limb: Secondary | ICD-10-CM | POA: Diagnosis not present

## 2017-09-18 DIAGNOSIS — M79644 Pain in right finger(s): Secondary | ICD-10-CM

## 2017-09-18 MED ORDER — LIDOCAINE HCL 1 % IJ SOLN
1.0000 mL | INTRAMUSCULAR | Status: AC | PRN
  Administered 2017-09-18: 1 mL

## 2017-09-18 MED ORDER — METHYLPREDNISOLONE ACETATE 40 MG/ML IJ SUSP
40.0000 mg | INTRAMUSCULAR | Status: AC | PRN
  Administered 2017-09-18: 40 mg

## 2017-09-18 MED ORDER — METHYLPREDNISOLONE ACETATE 40 MG/ML IJ SUSP
20.0000 mg | INTRAMUSCULAR | Status: AC | PRN
  Administered 2017-09-18: 20 mg

## 2017-09-18 MED ORDER — LIDOCAINE HCL 1 % IJ SOLN
0.5000 mL | INTRAMUSCULAR | Status: AC | PRN
  Administered 2017-09-18: .5 mL

## 2017-09-18 NOTE — Progress Notes (Signed)
Office Visit Note   Patient: Jillian Norton           Date of Birth: 1940/10/18           MRN: [id number] Visit Date: 09/18/2017              Requested by: Gayland Curry, DO [place], [place] [address] PCP: Shon Baton, MD   Assessment & Plan: Visit Diagnoses:  1. Carpal tunnel syndrome, right upper limb   2. Pain in right finger(s)     Plan: Dr. Ninfa Linden provided injections most at the right wrist and right finger.  We will see her back in a month check progress lack of.  Discussed with her that this may be an early mucoid cyst at the right fourth finger PIP joint.  If her carpal tunnel syndrome symptoms do not improve an injection may consider EMG nerve conduction studies.  Questions were encouraged and answered at length by Dr. Ninfa Linden and myself.  Follow-Up Instructions: Return in about 4 weeks (around 10/16/2017).   Orders:  Orders Placed This Encounter  Procedures  . Hand/UE Inj  . Hand/UE Inj   No orders of the defined types were placed in this encounter.     Procedures: Hand/UE Inj: R carpal tunnel for carpal tunnel syndrome on 09/18/2017 11:53 AM Indications: therapeutic Details: 25 G needle, dorsal approach Medications: 1 mL lidocaine 1 %; 40 mg methylPREDNISolone acetate 40 MG/ML Consent was given by the patient. Patient was prepped and draped in the usual sterile fashion.   Hand/UE Inj for (4th finger PIP joint mucoid cyst) on 09/18/2017 11:53 AM Indications: pain Details: 25 G needle, dorsal approach Medications: 0.5 mL lidocaine 1 %; 20 mg methylPREDNISolone acetate 40 MG/ML Consent was given by the patient. Patient was prepped and draped in the usual sterile fashion.       Clinical Data: No additional findings.   Subjective: Chief Complaint  Patient presents with  . Right Hand - Pain    HPI Jillian Norton 77 year old female comes in today due to numbness tingling in the right hand involving all fingers.  She had no known injury it  does awaken her.  She is tried a wrist splint which she finds uncomfortable to wear but it does help some at night.  She states that the numbness has been going on for the past month.  She denies any neck pain no radicular symptoms down either arm.  She is also having some soreness right fourth finger dorsal area she states that there is a spot at the knuckle is painful to touch.  She had no injury to the right fourth finger. Review of Systems See HPI otherwise negative  Objective: Vital Signs: There were no vitals taken for this visit.  Physical Exam  Constitutional: She is oriented to person, place, and time. She appears well-developed and well-nourished. No distress.  Neurological: She is alert and oriented to person, place, and time.  Skin: She is not diaphoretic.  Psychiatric: She has a normal mood and affect.    Ortho Exam Bilateral hands no rashes skin lesions ulcerations.  There is some slight edema ulnar aspect of the fourth PIP joint.  No palpable cyst.  She does have tenderness over this area.  Full motor full sensation bilateral hands.  Negative Tinel's and compression test at the wrist at the median nerve bilaterally.  Positive Phalen's on the right negative on the left.  She has good range of motion of  C-spine without pain. Specialty Comments:  No specialty comments available.  Imaging: No results found.   PMFS History: Patient Active Problem List   Diagnosis Date Noted  . Overweight (BMI 25.0-29.9) 04/07/2017  . History of cancer of right breast 04/07/2017  . History of DVT (deep vein thrombosis) 04/07/2017  . GERD (gastroesophageal reflux disease) 11/01/2016  . Primary insomnia 11/01/2016  . Urge incontinence 11/01/2016  . History of colon polyps 11/01/2016  . Postnasal drip 11/01/2016  . Shingles 06/27/2016   Past Medical History:  Diagnosis Date  . Anxiety   . Breast cancer ([place]) 06/27/2012   Right  . Cataract   . DVT (deep venous thrombosis) ([place])  06/28/2015  . Dyslipidemia   . GERD (gastroesophageal reflux disease)   . Insomnia   . Osteopenia   . Overactive bladder   . Personal history of radiation therapy   . Shingles 06/2016  . Thyroid disease     Family History  Problem Relation Age of Onset  . Lung cancer Mother   . Colon cancer Father     Past Surgical History:  Procedure Laterality Date  . BREAST LUMPECTOMY Right 2014  . BREAST LUMPECTOMY  2013   right  . CATARACT EXTRACTION W/ INTRAOCULAR LENS IMPLANT Bilateral   . COLONOSCOPY  08/27/2012   [place]  . KNEE ARTHROSCOPY     left  . LASIK Bilateral   . REDUCTION MAMMAPLASTY Bilateral 1985   anchor scar   . VAGINAL HYSTERECTOMY  2002   Social History   Occupational History  . Not on file  Tobacco Use  . Smoking status: Former Smoker    Packs/day: 1.00    Years: 10.00    Pack years: 10.00    Types: Cigarettes  . Smokeless tobacco: Never Used  Substance and Sexual Activity  . Alcohol use: Yes    Comment: 1-2 drinks per day  . Drug use: No  . Sexual activity: Not Currently

## 2017-09-25 ENCOUNTER — Encounter: Payer: Self-pay | Admitting: Oncology

## 2017-09-25 ENCOUNTER — Telehealth: Payer: Self-pay | Admitting: Oncology

## 2017-09-25 NOTE — Telephone Encounter (Signed)
Pt has been scheduled to see Dr. Jana Hakim on 2/28 at 4pm. Pt aware to arrive 30 minutes early. Letter and directions mailed to the pt. Pt will have her records faxed to our office. Fax number given to the pt.

## 2017-10-18 ENCOUNTER — Other Ambulatory Visit: Payer: Medicare Other

## 2017-10-21 ENCOUNTER — Encounter (INDEPENDENT_AMBULATORY_CARE_PROVIDER_SITE_OTHER): Payer: Self-pay | Admitting: Orthopaedic Surgery

## 2017-10-21 ENCOUNTER — Ambulatory Visit: Payer: Self-pay

## 2017-10-21 ENCOUNTER — Ambulatory Visit (INDEPENDENT_AMBULATORY_CARE_PROVIDER_SITE_OTHER): Payer: Medicare Other | Admitting: Orthopaedic Surgery

## 2017-10-21 DIAGNOSIS — G5601 Carpal tunnel syndrome, right upper limb: Secondary | ICD-10-CM | POA: Diagnosis not present

## 2017-10-21 DIAGNOSIS — B351 Tinea unguium: Secondary | ICD-10-CM

## 2017-10-21 NOTE — Progress Notes (Signed)
The patient is following up after having an injection in her right transverse carpal ligament to treat symptoms of carpal tunnel syndrome.  She says she is doing well overall and does not have any numbness and tingling that wakes her up at night.  She does have some slight numbness in her right thumb only.  On examination she has excellent pinch and grip strength on her right side with a well-perfused hand and no muscle atrophy.  We had a long and thorough discussion about transverse carpal ligament and carpal tunnel syndrome in general.  Her symptoms are minimal but if they worsen in any way she will let us know because my next step would be to obtain nerve conduction studies if warranted.

## 2017-10-23 NOTE — Progress Notes (Signed)
[place]  [phone] [phone]     ID: Jillian Norton DOB: 1941/07/27  MR#: [id number]  GFQ#:[id number]  Patient Care Team: Shon Baton, MD as PCP - General (Internal Medicine) Kamsiyochukwu Buist, Virgie Dad, MD as Consulting Physician (Oncology) OTHER MD:  CHIEF COMPLAINT: Estrogen receptor positive breast cancer  CURRENT TREATMENT: Anastrozole   HISTORY OF CURRENT ILLNESS: Jillian Norton had routine screening mammography on 07/14/2012 showing a possible abnormality in the right breast. On 07/30/2012, she had an right ultrasound with results showing: two adjacent lesions in the right breast at the 10 o'clock position, which were irregular in appearance, hypoechoic, and demonstrating posterior shadowing. the larger lesion measured 1 x 1 x 1.4 cm and the second lesion was 1 x 0.5 x 0.5 cm. There were located 6 cm from the nipple at the 10:30 position and were approximately 3-4 mm distant from each other. No adenopathy was detected. It was also stated as BI-RADS Category 5.   She had a right breast biopsy on 07/31/2012 with results showing ([id number]): well-differentiated invasive ductal carcinoma.   Following that on 08/14/2012 she had right partial mastectomy with SLN biopsy performed by Dr. Philippa Sicks with results showing ([place]): 1. Lymph node, right axillary sentinel, biopsy; one lymph node negative for metastatis carcinoma by light microscopy and cytokeratin immunohistochemistry. 2. Breast, right, needle localization excisional biopsy: Invasice ductal carcinoma, grade 1. Carcinoma measures 2.3 cm in greatest dimension. Invasive carcinoma is 2.0 mm from inferior and anterior surgical margins. Atypical ductal hyperplasia less than 1.0 mm from superior and lateral surgical margins. Widely free posterior and medial surgical margins. Negative for lymphovascular invasion. Diffuse adenosis with microcalcifications. Prognostic indicators significant for: ER,  99% positive and PR, 19% positive. Proliferation marker Ki67 at 8%. HER2 not-amplified, with a ratio of 1.0  She was treated with Mammosite Radiation following surgery on 08/14/2012 using the [place] regimen . She was then started on anastrozole from 09/27/2012-04/13/2014 at which at that time, she was switched to tamoxifen due to complaints of hair thinning.   Bone DEXA scan was completed on 09/29/2014 and showed osteopenia with T-score of -1.4.   In July 2017 she had a clot in the distal right lower extremity, and tamoxifen was stopped.  She has been off antiestrogens since then  Most recently, she had a diagnostic right mammogram and right breast US completed at [facility] on 04/22/2017 with results of: Breast density category C. There are no new mammographic or targeted sonographic abnormalities at the site of pain at the patient's lumpectomy site.  The patient's subsequent history is as detailed below.  INTERVAL HISTORY: Jillian Norton was evaluated in the breast clinic 10/24/2017.  She is establishing herself on my service today.  She notes that she is doing well overall. She moved to the area from [place], [place] about a year ago.  As far as PMHx, she denies HTN, DM, High cholesterol, stroke, seizure, hx of migraines. Glaucoma, retinal detachment, heart murmur, MI, palpitations, and ulcers. She has GERD and she treats with omprezaole. She had bilateral cataract surgery. She had a vaginal hysterectomy in 2001 with her ovaries still intact.    REVIEW OF SYSTEMS: Jillian Norton reports that for exercise, she completes an exercise program at [facility] twice a week, she walks and she plays golf when the weather is nice. She has urinary urgency and she is being treated for this with minimal relief. She denies unusual headaches, visual changes, nausea, vomiting, or dizziness.  There has been no unusual cough, phlegm production, or pleurisy. This been no change in bowel or bladder habits. She denies  unexplained fatigue or unexplained weight loss, bleeding, rash, or fever. A detailed review of systems was otherwise stable.    PAST MEDICAL HISTORY: Past Medical History:  Diagnosis Date  . Anxiety   . Breast cancer ([place]) 06/27/2012   Right  . Cataract   . DVT (deep venous thrombosis) ([place]) 06/28/2015  . Dyslipidemia   . GERD (gastroesophageal reflux disease)   . Insomnia   . Osteopenia   . Overactive bladder   . Personal history of radiation therapy   . Shingles 06/2016  . Thyroid disease     PAST SURGICAL HISTORY: Past Surgical History:  Procedure Laterality Date  . BREAST LUMPECTOMY Right 2014  . BREAST LUMPECTOMY  2013   right  . CATARACT EXTRACTION W/ INTRAOCULAR LENS IMPLANT Bilateral   . COLONOSCOPY  08/27/2012   [place]  . KNEE ARTHROSCOPY     left  . LASIK Bilateral   . REDUCTION MAMMAPLASTY Bilateral 1985   anchor scar   . VAGINAL HYSTERECTOMY  2002    FAMILY HISTORY Family History  Problem Relation Age of Onset  . Lung cancer Mother   . Colon cancer Father   Her mother died at age 9 of small cell lung cancer. Her father died at age 67 of colon cancer. She has one brother and no sisters. She denies a family hx of breast or ovarian cancer. Her maternal grandfather had cancer but she does not know what type.  GYNECOLOGIC HISTORY:  No LMP recorded. Patient has had a hysterectomy. Menarche: 77 years old Age at first live birth: 77 years old [place] P2 LMP: at age 69  Contraceptive: OCP and IUD HRT: Yes, She took estrogen and then bioidenticals for a few years.  Hysterectomy: Vaginal hysterectomy 2001 with ovaries intact.  SO?: No    SOCIAL HISTORY: She is a former Therapist, music.  Her husband recently passed due to pulmonary issues.  She lives independently at [employer] and she doesn't have any pets at this time. She has two children, Clarise Cruz, who is a professor and runs [employer] under the [place] at [employer]. Her son Amedeo Plenty lives in  [place] with his wife and daughters.  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  The patient has three granddaughters:one studying Interpretation of Language Cultures at the [employer], the second studying Nutritional therapist at [employer], and her last granddaughter is a recent Writer.  The patient doesn't belong to a church.     ADVANCED DIRECTIVES:    HEALTH MAINTENANCE: Social History   Tobacco Use  . Smoking status: Former Smoker    Packs/day: 1.00    Years: 10.00    Pack years: 10.00    Types: Cigarettes  . Smokeless tobacco: Never Used  Substance Use Topics  . Alcohol use: Yes    Comment: 1-2 drinks per day  . Drug use: No     Colonoscopy: [place], 2014  PAP:   Bone density:    Allergies  Allergen Reactions  . Sporanos [Itraconazole] Anaphylaxis    Current Outpatient Medications  Medication Sig Dispense Refill  . acetaminophen (TYLENOL) 325 MG tablet Take 650 mg by mouth as needed. Pt stated, "When I have pain, takes 3 tablets at a time for the pain; it does help"    . Diclofenac Sodium (VOLTAREN EX) Apply topically as needed.    Marland Kitchen  omeprazole (PRILOSEC) 40 MG capsule Take 1 capsule (40 mg total) by mouth daily. 90 capsule 3  . solifenacin (VESICARE) 5 MG tablet Take 5 mg by mouth daily.    Marland Kitchen terbinafine (LAMISIL) 250 MG tablet Please take one a day x 7days, repeat every 4 weeks x 4 months 28 tablet 0  . zolpidem (AMBIEN) 10 MG tablet Take 1 tablet (10 mg total) by mouth at bedtime as needed for sleep. 30 tablet 0  . escitalopram (LEXAPRO) 10 MG tablet      No current facility-administered medications for this visit.     OBJECTIVE: Middle-aged white woman in no acute distress  Vitals:   10/24/17 1548  BP: 131/70  Pulse: 71  Resp: 18  Temp: 97.6 F (36.4 C)  SpO2: 100%     Body mass index is 31.17 kg/m.   Wt Readings from Last 3 Encounters:  10/24/17 181 lb 9.6 oz (82.4 kg)  05/27/17 178 lb 6.4 oz (80.9 kg)  04/03/17  173 lb (78.5 kg)      ECOG FS:0 - Asymptomatic  Ocular: Sclerae unicteric, pupils round and equal Ear-nose-throat: Oropharynx clear and moist Lymphatic: No cervical or supraclavicular adenopathy Lungs no rales or rhonchi Heart regular rate and rhythm Abd soft, nontender, positive bowel sounds MSK no focal spinal tenderness, no joint edema Neuro: non-focal, well-oriented, appropriate affect Breasts: The right breast is status post lumpectomy and MammoSite radiation.  There is the expected area of induration associated with the treatment site.  There are no skin or nipple changes of concern.  There is no evidence of local recurrence.  The left breast is unremarkable.  Both axillae are benign.   LAB RESULTS:  CMP     Component Value Date/Time   NA 140 05/27/2017 1417   NA 139 11/11/2015   K 4.4 05/27/2017 1417   CL 105 05/27/2017 1417   CO2 27 05/27/2017 1417   GLUCOSE 117 05/27/2017 1417   BUN 14 05/27/2017 1417   BUN 20 11/11/2015   CREATININE 0.88 05/27/2017 1417   CALCIUM 9.4 05/27/2017 1417   PROT 6.6 05/27/2017 1417   ALBUMIN 4.3 11/01/2016 1031   AST 16 05/27/2017 1417   ALT 28 05/27/2017 1417   ALKPHOS 42 11/01/2016 1031   BILITOT 0.4 05/27/2017 1417   GFRNONAA 64 05/27/2017 1417   GFRAA 74 05/27/2017 1417    No results found for: TOTALPROTELP, ALBUMINELP, A1GS, A2GS, BETS, BETA2SER, GAMS, MSPIKE, SPEI  No results found for: Nils Pyle, [facility]  Lab Results  Component Value Date   WBC 4.9 05/27/2017   NEUTROABS 2,622 05/27/2017   HGB 12.6 05/27/2017   HCT 37.0 05/27/2017   MCV 92.5 05/27/2017   PLT 238 05/27/2017    @LASTCHEMISTRY @  No results found for: LABCA2  No components found for: [id number]  No results for input(s): INR in the last 168 hours.  No results found for: LABCA2  No results found for: DUK025  No results found for: KYH062  No results found for: BJS283  No results found for: CA2729  No components found for:  HGQUANT  No results found for: CEA1 / No results found for: CEA1   No results found for: AFPTUMOR  No results found for: CHROMOGRNA  No results found for: PSA1  No visits with results within 3 Day(s) from this visit.  Latest known visit with results is:  Office Visit on 05/27/2017  Component Date Value Ref Range Status  . WBC 05/27/2017 4.9  3.8 - 10.8  Thousand/uL Final  . RBC 05/27/2017 4.00  3.80 - 5.10 Million/uL Final  . Hemoglobin 05/27/2017 12.6  11.7 - 15.5 g/dL Final  . HCT 05/27/2017 37.0  35.0 - 45.0 % Final  . MCV 05/27/2017 92.5  80.0 - 100.0 fL Final  . MCH 05/27/2017 31.5  27.0 - 33.0 pg Final  . MCHC 05/27/2017 34.1  32.0 - 36.0 g/dL Final  . RDW 05/27/2017 12.4  11.0 - 15.0 % Final  . Platelets 05/27/2017 238  140 - 400 Thousand/uL Final  . MPV 05/27/2017 10.2  7.5 - 12.5 fL Final  . Neutro Abs 05/27/2017 2,622  1,500 - 7,800 cells/uL Final  . Lymphs Abs 05/27/2017 2,058  850 - 3,900 cells/uL Final  . WBC mixed population 05/27/2017 152* 200 - 950 cells/uL Final  . Eosinophils Absolute 05/27/2017 49  15 - 500 cells/uL Final  . Basophils Absolute 05/27/2017 20  0 - 200 cells/uL Final  . Neutrophils Relative % 05/27/2017 53.5  % Final  . Total Lymphocyte 05/27/2017 42.0  % Final  . Monocytes Relative 05/27/2017 3.1  % Final  . Eosinophils Relative 05/27/2017 1.0  % Final  . Basophils Relative 05/27/2017 0.4  % Final  . Glucose, Bld 05/27/2017 117  65 - 139 mg/dL Final   Comment: .        Non-fasting reference interval .   . BUN 05/27/2017 14  7 - 25 mg/dL Final  . Creat 05/27/2017 0.88  0.60 - 0.93 mg/dL Final   Comment: For patients >26 years of age, the reference limit for Creatinine is approximately 13% higher for people identified as African-American. .   . GFR, Est Non African American 05/27/2017 64  > OR = 60 mL/min/1.32m Final  . GFR, Est African American 05/27/2017 74  > OR = 60 mL/min/1.770mFinal  . BUN/Creatinine Ratio 1049/82/6415OT  APPLICABLE  6 - 22 (calc) Final  . Sodium 05/27/2017 140  135 - 146 mmol/L Final  . Potassium 05/27/2017 4.4  3.5 - 5.3 mmol/L Final  . Chloride 05/27/2017 105  98 - 110 mmol/L Final  . CO2 05/27/2017 27  20 - 32 mmol/L Final  . Calcium 05/27/2017 9.4  8.6 - 10.4 mg/dL Final  . Total Protein 05/27/2017 6.6  6.1 - 8.1 g/dL Final  . Albumin 05/27/2017 4.4  3.6 - 5.1 g/dL Final  . Globulin 05/27/2017 2.2  1.9 - 3.7 g/dL (calc) Final  . AG Ratio 05/27/2017 2.0  1.0 - 2.5 (calc) Final  . Total Bilirubin 05/27/2017 0.4  0.2 - 1.2 mg/dL Final  . Alkaline phosphatase (APISO) 05/27/2017 43  33 - 130 U/L Final  . AST 05/27/2017 16  10 - 35 U/L Final  . ALT 05/27/2017 28  6 - 29 U/L Final  . TSH 05/27/2017 2.56  0.40 - 4.50 mIU/L Final    (this displays the last labs from the last 3 days)  No results found for: TOTALPROTELP, ALBUMINELP, A1GS, A2GS, BETS, BETA2SER, GAMS, MSPIKE, SPEI (this displays SPEP labs)  No results found for: KPAFRELGTCHN, LAMBDASER, KAPLAMBRATIO (kappa/lambda light chains)  No results found for: HGBA, HGBA2QUANT, HGBFQUANT, HGBSQUAN (Hemoglobinopathy evaluation)   No results found for: LDH  No results found for: IRON, TIBC, IRONPCTSAT (Iron and TIBC)  No results found for: FERRITIN  Urinalysis No results found for: COLORURINE, APPEARANCEUR, LABSPEC, PHURINE, GLUCOSEU, HGBUR, BILIRUBINUR, KETONESUR, PROTEINUR, UROBILINOGEN, NITRITE, LEUKOCYTESUR   STUDIES: Next mammography will be due at the breast center late August or early September  ELIGIBLE FOR AVAILABLE RESEARCH PROTOCOL: no  ASSESSMENT: 77 y.o. [place] woman status post right breast upper outer quadrant lumpectomy 08/14/2012 for a pT2 pN0, stage IA invasive ductal carcinoma, grade 1, estrogen and progesterone receptor positive, HER-2 not amplified, with an MIB-1 of 10%  (a) total 1 l axillary ymph node was removed  (1) completed MammoSite radiation to the right breast  (2) antiestrogens  therapy:  (a) anastrozole February 2014 through August 2015  (b) tamoxifen in August 2015 to present  (3) osteopenia, with DEXA scan February 2016 showing a T score of -1.4  PLAN: We spent the better part of today's hour-long appointment discussing the biology of her diagnosis and the specifics of her situation.  I reviewed the pathology of Jillian Norton tumor and she understands that her cancer has now been reclassified as stage Ia (it was anatomically stage II).  We reviewed the reason for her having scar tissue where she does related to her MammoSite treatment.  We then discussed systemic therapy.  With local treatment only her risk of recurrence in the future would be somewhere in the 24-40% range.  Available systemic therapy includes chemotherapy, anti-HER-2 immunotherapy, and antiestrogens.  She is not a candidate for anti-HER-2 treatment since her cancer was HER-2 negative.  She opted against chemotherapy and I think that was a correct choice: She had a grade 1, slow-growing tumor and the chance of chemotherapy being helpful in her case are very low  Accordingly the only systemic therapy she is receiving her has received his antiestrogens.  If she takes them for 5years they will cut the risk of recurrence in half.  However she has only taken antiestrogens for 3-1/2 years.  I do not have data comparing 3 versus 5 years of anastrozole.  We do have that data on tamoxifen and 5 years of tamoxifen is clearly better than 3.  We also discussed data comparing 5 years of tamoxifen versus 10 and 5 years of anastrozole versus 7  Ensured if she took another year and a half of antiestrogens she will have met the current standard of care.  That is married,.  We then discussed management of side effects.  If she develops significant problems from hot flashes we will start venlafaxine.  She is not interested in our pelvic health program.  I am going to see her again in 3 months just to make sure she is  tolerating things well.  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    Jillian Norton has a good understanding of the overall plan. She agrees with it. She knows the goal of treatment in her case is cure. She will call with any problems that may develop before her next visit here.  Jillian Norton, Virgie Dad, MD  10/24/17 4:18 PM Medical Oncology and Hematology [facility] [address] [place], [place] [address] Tel. [phone]    Fax. [phone]    This document serves as a record of services personally performed by Sarajane Jews  Brady Schiller, MD. It was created on his behalf by Steva Colder, a trained medical scribe. The creation of this record is based on the scribe's personal observations and the provider's statements to them.   Sclerae unicteric, EOMs intact Oropharynx clear and moist No cervical or supraclavicular adenopathy Lungs no rales or rhonchi Heart regular rate and rhythm Abd soft, nontender, positive bowel sounds MSK no focal spinal tenderness, no upper extremity lymphedema Neuro: nonfocal, well oriented, appropriate affect Breasts: Deferred

## 2017-10-24 ENCOUNTER — Inpatient Hospital Stay: Payer: Medicare Other | Attending: Oncology | Admitting: Oncology

## 2017-10-24 DIAGNOSIS — Z800 Family history of malignant neoplasm of digestive organs: Secondary | ICD-10-CM | POA: Diagnosis not present

## 2017-10-24 DIAGNOSIS — C50411 Malignant neoplasm of upper-outer quadrant of right female breast: Secondary | ICD-10-CM | POA: Diagnosis not present

## 2017-10-24 DIAGNOSIS — M8580 Other specified disorders of bone density and structure, unspecified site: Secondary | ICD-10-CM | POA: Diagnosis not present

## 2017-10-24 DIAGNOSIS — Z87891 Personal history of nicotine dependence: Secondary | ICD-10-CM

## 2017-10-24 DIAGNOSIS — Z801 Family history of malignant neoplasm of trachea, bronchus and lung: Secondary | ICD-10-CM | POA: Diagnosis not present

## 2017-10-24 DIAGNOSIS — Z170 Estrogen receptor positive status [ER+]: Secondary | ICD-10-CM | POA: Diagnosis not present

## 2017-10-24 DIAGNOSIS — C50211 Malignant neoplasm of upper-inner quadrant of right female breast: Secondary | ICD-10-CM | POA: Insufficient documentation

## 2017-10-24 MED ORDER — ANASTROZOLE 1 MG PO TABS: 1.0000 mg | ORAL_TABLET | Freq: Every day | ORAL | 4 refills | Status: DC

## 2017-10-25 ENCOUNTER — Telehealth: Payer: Self-pay | Admitting: Oncology

## 2017-10-25 NOTE — Telephone Encounter (Signed)
Per 2/28 no los °

## 2017-10-28 NOTE — Progress Notes (Signed)
Pt presents with mycotic infection of nail hallux left.    All other systems are negative  Laser therapy administered to affected nails and tolerated well. All safety precautions were in place. Re-appointed in 4 weeks for 4th treatment.

## 2017-11-07 ENCOUNTER — Other Ambulatory Visit: Payer: Self-pay | Admitting: Oncology

## 2017-11-18 ENCOUNTER — Other Ambulatory Visit: Payer: Medicare Other

## 2017-11-25 DIAGNOSIS — L820 Inflamed seborrheic keratosis: Secondary | ICD-10-CM | POA: Diagnosis not present

## 2017-11-25 DIAGNOSIS — L570 Actinic keratosis: Secondary | ICD-10-CM | POA: Diagnosis not present

## 2018-01-01 ENCOUNTER — Telehealth: Payer: Self-pay | Admitting: *Deleted

## 2018-01-01 NOTE — Telephone Encounter (Signed)
Voicemail received requesting "Return call [phone] in reference questions on sided effects of anastrozole.  Experiencing hair loss and thinning, curious if this medicine could be the reason."  Message forwarded to collaborative for further patient communication.

## 2018-01-02 NOTE — Telephone Encounter (Signed)
This RN returned call to pt and discussed probable hair loss from anastrazole use.  Of note pt had hair loss with prior use and due to concern was switched to tamoxifen.  Pt developed a DVT - tamoxifen discontinued.  Pt has no other issues other then noted hair thinning.  She is able to have a cut and style that she feels socially comfortable with.  Plan per discussion is pt will monitor- if loss becomes more severe she will call this office.  No other needs at this time.

## 2018-01-24 NOTE — Progress Notes (Signed)
[place]  [phone] Fax:([phone]     ID: Jillian Norton DOB: 04-27-1941  MR#: [id number]  JAS#:[id number]  Patient Care Team: Jillian Baton, MD as PCP - General (Internal Medicine) Norton, Jillian Dad, MD as Consulting Physician (Oncology) Jillian Curry, DO as Consulting Physician (Geriatric Medicine) Jillian Rossetti, MD as Consulting Physician (Orthopedic Surgery) OTHER MD:  CHIEF COMPLAINT: Estrogen receptor positive breast cancer  CURRENT TREATMENT: Completing 5 years of anastrozole   HISTORY OF CURRENT ILLNESS: From the original intake note:  Jillian Norton had routine screening mammography on 07/14/2012 showing a possible abnormality in the right breast. On 07/30/2012, she had an right ultrasound with results showing: two adjacent lesions in the right breast at the 10 o'clock position, which were irregular in appearance, hypoechoic, and demonstrating posterior shadowing. the larger lesion measured 1 x 1 x 1.4 cm and the second lesion was 1 x 0.5 x 0.5 cm. There were located 6 cm from the nipple at the 10:30 position and were approximately 3-4 mm distant from each other. No adenopathy was detected. It was also stated as BI-RADS Category 5.   She had a right breast biopsy on 07/31/2012 with results showing ([id number]): well-differentiated invasive ductal carcinoma.   Following that on 08/14/2012 she had right partial mastectomy with SLN biopsy performed by Dr. Philippa Norton with results showing ([place]): 1. Lymph node, right axillary sentinel, biopsy; one lymph node negative for metastatis carcinoma by light microscopy and cytokeratin immunohistochemistry. 2. Breast, right, needle localization excisional biopsy: Invasice ductal carcinoma, grade 1. Carcinoma measures 2.3 cm in greatest dimension. Invasive carcinoma is 2.0 mm from inferior and anterior surgical margins. Atypical ductal hyperplasia less than 1.0 mm from superior and lateral  surgical margins. Widely free posterior and medial surgical margins. Negative for lymphovascular invasion. Diffuse adenosis with microcalcifications. Prognostic indicators significant for: ER, 99% positive and PR, 19% positive. Proliferation marker Ki67 at 8%. HER2 not-amplified, with a ratio of 1.0  She was treated with Mammosite Radiation following surgery on 08/14/2012 using the [place] regimen . She was then started on anastrozole from 09/27/2012-04/13/2014 at which at that time, she was switched to tamoxifen due to complaints of hair thinning.   Bone DEXA scan was completed on 09/29/2014 and showed osteopenia with T-score of -1.4.   In July 2017 she had a clot in the distal right lower extremity, and tamoxifen was stopped.  She has been off antiestrogens since then  Most recently, she had a diagnostic right mammogram and right breast US completed at [facility] on 04/22/2017 with results of: Breast density category C. There are no new mammographic or targeted sonographic abnormalities at the site of pain at the patient's lumpectomy site.  The patient's subsequent history is as detailed below.  INTERVAL HISTORY: Jillian Norton returns today for follow up and treatment of her estrogen receptor positive breast cancer. She continues on anastrozole, with good tolerance. She plans to takes what she has left of the medication.  She will then go off treatment.  She denies having hot flashes. She had some hair thinning. She denies issues with vaginal dryness.  She will be due for mammography is August 2019.  REVIEW OF SYSTEMS: Jillian Norton reports that she is doing well. She experiences some right breast tenderness that is intermmitent. For exercise, she enjoys playing golf frequently and takes walks often. She has some urinary urgency, and she is being treated for this. She may have some troubles making  it to the bathroom. She had a fungal infection on the left big toe, but this has improved and the nail  is slowly growing. She denies unusual headaches, visual changes, nausea, vomiting, or dizziness. There has been no unusual cough, phlegm production, or pleurisy. This been no change in bowel or bladder habits. She denies unexplained fatigue or unexplained weight loss, bleeding, rash, or fever. A detailed review of systems was otherwise stable.  PAST MEDICAL HISTORY: Past Medical History:  Diagnosis Date  . Anxiety   . Breast cancer ([place]) 06/27/2012   Right  . Cataract   . DVT (deep venous thrombosis) ([place]) 06/28/2015  . Dyslipidemia   . GERD (gastroesophageal reflux disease)   . Insomnia   . Osteopenia   . Overactive bladder   . Personal history of radiation therapy   . Shingles 06/2016  . Thyroid disease     PAST SURGICAL HISTORY: Past Surgical History:  Procedure Laterality Date  . BREAST LUMPECTOMY Right 2014  . BREAST LUMPECTOMY  2013   right  . CATARACT EXTRACTION W/ INTRAOCULAR LENS IMPLANT Bilateral   . COLONOSCOPY  08/27/2012   [place]  . KNEE ARTHROSCOPY     left  . LASIK Bilateral   . REDUCTION MAMMAPLASTY Bilateral 1985   anchor scar   . VAGINAL HYSTERECTOMY  2002    FAMILY HISTORY Family History  Problem Relation Age of Onset  . Lung cancer Mother   . Colon cancer Father   Her mother died at age 70 of small cell lung cancer. Her father died at age 40 of colon cancer. She has one brother and no sisters. She denies a family hx of breast or ovarian cancer. Her maternal grandfather had cancer but she does not know what type.  GYNECOLOGIC HISTORY:  No LMP recorded. Patient has had a hysterectomy. Menarche: 77 years old Age at first live birth: 77 years old [place] P2 LMP: at age 42  Contraceptive: OCP and IUD HRT: Yes, She took estrogen and then bioidenticals for a few years.  Hysterectomy: Vaginal hysterectomy 2001 with ovaries intact.  SO?: No  SOCIAL HISTORY: She is a former Therapist, music.  Her husband recently passed due to pulmonary issues.  She lives  independently at [employer] and she doesn't have any pets at this time. She has two children, Jillian Norton, who is a professor and runs [employer] under the [employer] at [employer]. Her son Amedeo Plenty lives in [place] with his wife and daughters.  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  The patient has three granddaughters:one studying Interpretation of Language Cultures at the [employer], the second studying Nutritional therapist at [employer], and her last granddaughter is a recent Writer.  The patient doesn't belong to a church.     ADVANCED DIRECTIVES:    HEALTH MAINTENANCE: Social History   Tobacco Use  . Smoking status: Former Smoker    Packs/day: 1.00    Years: 10.00    Pack years: 10.00    Types: Cigarettes  . Smokeless tobacco: Never Used  Substance Use Topics  . Alcohol use: Yes    Comment: 1-2 drinks per day  . Drug use: No     Colonoscopy: [place], 2014  PAP:   Bone density:    Allergies  Allergen Reactions  . Sporanos [Itraconazole] Anaphylaxis    Current Outpatient Medications  Medication Sig Dispense Refill  . acetaminophen (TYLENOL) 325 MG tablet Take 650 mg by mouth as needed. Pt stated, "When  I have pain, takes 3 tablets at a time for the pain; it does help"    . anastrozole (ARIMIDEX) 1 MG tablet Take 1 tablet (1 mg total) by mouth daily. 90 tablet 4  . Diclofenac Sodium (VOLTAREN EX) Apply topically as needed.    Marland Kitchen escitalopram (LEXAPRO) 10 MG tablet     . omeprazole (PRILOSEC) 40 MG capsule Take 1 capsule (40 mg total) by mouth daily. 90 capsule 3  . solifenacin (VESICARE) 5 MG tablet Take 5 mg by mouth daily.    Marland Kitchen terbinafine (LAMISIL) 250 MG tablet Please take one a day x 7days, repeat every 4 weeks x 4 months 28 tablet 0  . zolpidem (AMBIEN) 10 MG tablet Take 1 tablet (10 mg total) by mouth at bedtime as needed for sleep. 30 tablet 0   No current facility-administered medications for this visit.      OBJECTIVE: Middle-aged white woman who appears well  Vitals:   01/28/18 1435  BP: 109/71  Pulse: 66  Resp: 18  Temp: 98.3 F (36.8 C)  SpO2: 100%     Body mass index is 29.09 kg/m.   Wt Readings from Last 3 Encounters:  01/28/18 169 lb 8 oz (76.9 kg)  10/24/17 181 lb 9.6 oz (82.4 kg)  05/27/17 178 lb 6.4 oz (80.9 kg)      ECOG FS:0 - Asymptomatic  Sclerae unicteric, EOMs intact Oropharynx clear and moist No cervical or supraclavicular adenopathy Lungs no rales or rhonchi Heart regular rate and rhythm Abd soft, nontender, positive bowel sounds MSK no focal spinal tenderness, no upper extremity lymphedema Neuro: nonfocal, well oriented, appropriate affect Breasts: She is status post right lumpectomy and MammoSite radiation.  There is some distortion of the breast contour as a result.  There is no evidence of local recurrence.  The left breast is benign.  Both axillae are benign.  LAB RESULTS:  CMP     Component Value Date/Time   NA 140 05/27/2017 1417   NA 139 11/11/2015   K 4.4 05/27/2017 1417   CL 105 05/27/2017 1417   CO2 27 05/27/2017 1417   GLUCOSE 117 05/27/2017 1417   BUN 14 05/27/2017 1417   BUN 20 11/11/2015   CREATININE 0.88 05/27/2017 1417   CALCIUM 9.4 05/27/2017 1417   PROT 6.6 05/27/2017 1417   ALBUMIN 4.3 11/01/2016 1031   AST 16 05/27/2017 1417   ALT 28 05/27/2017 1417   ALKPHOS 42 11/01/2016 1031   BILITOT 0.4 05/27/2017 1417   GFRNONAA 64 05/27/2017 1417   GFRAA 74 05/27/2017 1417    No results found for: TOTALPROTELP, ALBUMINELP, A1GS, A2GS, BETS, BETA2SER, GAMS, MSPIKE, SPEI  No results found for: Nils Pyle, [facility]  Lab Results  Component Value Date   WBC 4.9 05/27/2017   NEUTROABS 2,622 05/27/2017   HGB 12.6 05/27/2017   HCT 37.0 05/27/2017   MCV 92.5 05/27/2017   PLT 238 05/27/2017    @LASTCHEMISTRY @  No results found for: LABCA2  No components found for: XAJOIN867  No results for input(s): INR in  the last 168 hours.  No results found for: LABCA2  No results found for: EHM094  No results found for: BSJ628  No results found for: ZMO294  No results found for: CA2729  No components found for: HGQUANT  No results found for: CEA1 / No results found for: CEA1   No results found for: AFPTUMOR  No results found for: [place]  No results found for: PSA1  No visits with  results within 3 Day(s) from this visit.  Latest known visit with results is:  Office Visit on 05/27/2017  Component Date Value Ref Range Status  . WBC 05/27/2017 4.9  3.8 - 10.8 Thousand/uL Final  . RBC 05/27/2017 4.00  3.80 - 5.10 Million/uL Final  . Hemoglobin 05/27/2017 12.6  11.7 - 15.5 g/dL Final  . HCT 05/27/2017 37.0  35.0 - 45.0 % Final  . MCV 05/27/2017 92.5  80.0 - 100.0 fL Final  . MCH 05/27/2017 31.5  27.0 - 33.0 pg Final  . MCHC 05/27/2017 34.1  32.0 - 36.0 g/dL Final  . RDW 05/27/2017 12.4  11.0 - 15.0 % Final  . Platelets 05/27/2017 238  140 - 400 Thousand/uL Final  . MPV 05/27/2017 10.2  7.5 - 12.5 fL Final  . Neutro Abs 05/27/2017 2,622  1,500 - 7,800 cells/uL Final  . Lymphs Abs 05/27/2017 2,058  850 - 3,900 cells/uL Final  . WBC mixed population 05/27/2017 152* 200 - 950 cells/uL Final  . Eosinophils Absolute 05/27/2017 49  15 - 500 cells/uL Final  . Basophils Absolute 05/27/2017 20  0 - 200 cells/uL Final  . Neutrophils Relative % 05/27/2017 53.5  % Final  . Total Lymphocyte 05/27/2017 42.0  % Final  . Monocytes Relative 05/27/2017 3.1  % Final  . Eosinophils Relative 05/27/2017 1.0  % Final  . Basophils Relative 05/27/2017 0.4  % Final  . Glucose, Bld 05/27/2017 117  65 - 139 mg/dL Final   Comment: .        Non-fasting reference interval .   . BUN 05/27/2017 14  7 - 25 mg/dL Final  . Creat 05/27/2017 0.88  0.60 - 0.93 mg/dL Final   Comment: For patients >23 years of age, the reference limit for Creatinine is approximately 13% higher for people identified as  African-American. .   . GFR, Est Non African American 05/27/2017 64  > OR = 60 mL/min/1.74m Final  . GFR, Est African American 05/27/2017 74  > OR = 60 mL/min/1.74mFinal  . BUN/Creatinine Ratio 1082/80/0349OT APPLICABLE  6 - 22 (calc) Final  . Sodium 05/27/2017 140  135 - 146 mmol/L Final  . Potassium 05/27/2017 4.4  3.5 - 5.3 mmol/L Final  . Chloride 05/27/2017 105  98 - 110 mmol/L Final  . CO2 05/27/2017 27  20 - 32 mmol/L Final  . Calcium 05/27/2017 9.4  8.6 - 10.4 mg/dL Final  . Total Protein 05/27/2017 6.6  6.1 - 8.1 g/dL Final  . Albumin 05/27/2017 4.4  3.6 - 5.1 g/dL Final  . Globulin 05/27/2017 2.2  1.9 - 3.7 g/dL (calc) Final  . AG Ratio 05/27/2017 2.0  1.0 - 2.5 (calc) Final  . Total Bilirubin 05/27/2017 0.4  0.2 - 1.2 mg/dL Final  . Alkaline phosphatase (APISO) 05/27/2017 43  33 - 130 U/L Final  . AST 05/27/2017 16  10 - 35 U/L Final  . ALT 05/27/2017 28  6 - 29 U/L Final  . TSH 05/27/2017 2.56  0.40 - 4.50 mIU/L Final    (this displays the last labs from the last 3 days)  No results found for: TOTALPROTELP, ALBUMINELP, A1GS, A2GS, BETS, BETA2SER, GAMS, MSPIKE, SPEI (this displays SPEP labs)  No results found for: KPAFRELGTCHN, LAMBDASER, KAPLAMBRATIO (kappa/lambda light chains)  No results found for: HGBA, HGBA2QUANT, HGBFQUANT, HGBSQUAN (Hemoglobinopathy evaluation)   No results found for: LDH  No results found for: IRON, TIBC, IRONPCTSAT (Iron and TIBC)  No results found for: FERRITIN  Urinalysis No results found for: COLORURINE, APPEARANCEUR, LABSPEC, PHURINE, GLUCOSEU, HGBUR, BILIRUBINUR, KETONESUR, PROTEINUR, UROBILINOGEN, NITRITE, LEUKOCYTESUR   STUDIES: Next mammography will be due at [facility] late August or early September 2019  ELIGIBLE FOR AVAILABLE RESEARCH PROTOCOL: no  ASSESSMENT: 78 y.o. [place] woman status post right breast upper outer quadrant lumpectomy 08/14/2012 for a pT2 pN0, stage IA invasive ductal carcinoma, grade 1,  estrogen and progesterone receptor positive, HER-2 not amplified, with an MIB-1 of 10%  (a) total 1 l axillary ymph node was removed  (1) completed MammoSite radiation to the right breast  (2) antiestrogens therapy:  (a) anastrozole February 2014 through August 2015  (b) tamoxifen in August 2015 to present  (3) osteopenia, with DEXA scan February 2016 showing a T score of -1.4  PLAN: Andres Escandon is now 5-1/2 years out from definitive surgery for her breast cancer with no evidence of disease recurrence.  This is very favorable.  She has completed 5 years of antiestrogens.  At this point I am comfortable with her stopping it and returning to her primary care physician for further observation  All she will need in terms of breast cancer follow-up is yearly mammography and a yearly physician breast exam.  I will be glad to see her at any point in the future if and when the need arises but as of now are making no further routine appointments for her here.    Feather has a good understanding of the overall plan. She agrees with it. She knows the goal of treatment in her case is cure. She will call with any problems that may develop before her next visit here.   Norton, Jillian Dad, MD  01/28/18 2:53 PM Medical Oncology and Hematology [facility] [address] [place], [place] [address] Tel. [phone]    Fax. [phone]  Alice Rieger, am acting as scribe for Chauncey Cruel MD.  I, Lurline Del MD, have reviewed the above documentation for accuracy and completeness, and I agree with the above.

## 2018-01-28 ENCOUNTER — Inpatient Hospital Stay: Payer: Medicare Other | Attending: Oncology | Admitting: Oncology

## 2018-01-28 VITALS — BP 109/71 | HR 66 | Temp 98.3°F | Resp 18 | Ht 64.0 in | Wt 169.5 lb

## 2018-01-28 DIAGNOSIS — M8580 Other specified disorders of bone density and structure, unspecified site: Secondary | ICD-10-CM | POA: Diagnosis not present

## 2018-01-28 DIAGNOSIS — C50211 Malignant neoplasm of upper-inner quadrant of right female breast: Secondary | ICD-10-CM

## 2018-01-28 DIAGNOSIS — Z170 Estrogen receptor positive status [ER+]: Secondary | ICD-10-CM

## 2018-01-28 DIAGNOSIS — Z853 Personal history of malignant neoplasm of breast: Secondary | ICD-10-CM | POA: Diagnosis not present

## 2018-01-29 ENCOUNTER — Telehealth: Payer: Self-pay | Admitting: Oncology

## 2018-01-29 NOTE — Telephone Encounter (Signed)
Per 6/4 no los °

## 2018-03-05 ENCOUNTER — Encounter (INDEPENDENT_AMBULATORY_CARE_PROVIDER_SITE_OTHER): Payer: Self-pay | Admitting: Orthopaedic Surgery

## 2018-03-05 ENCOUNTER — Ambulatory Visit (INDEPENDENT_AMBULATORY_CARE_PROVIDER_SITE_OTHER): Payer: Medicare Other | Admitting: Orthopaedic Surgery

## 2018-03-05 DIAGNOSIS — M1812 Unilateral primary osteoarthritis of first carpometacarpal joint, left hand: Secondary | ICD-10-CM | POA: Diagnosis not present

## 2018-03-05 DIAGNOSIS — M654 Radial styloid tenosynovitis [de Quervain]: Secondary | ICD-10-CM | POA: Diagnosis not present

## 2018-03-05 DIAGNOSIS — H16041 Marginal corneal ulcer, right eye: Secondary | ICD-10-CM | POA: Diagnosis not present

## 2018-03-05 MED ORDER — LIDOCAINE HCL 1 % IJ SOLN
1.0000 mL | INTRAMUSCULAR | Status: AC | PRN
  Administered 2018-03-05: 1 mL

## 2018-03-05 MED ORDER — METHYLPREDNISOLONE ACETATE 40 MG/ML IJ SUSP
40.0000 mg | INTRAMUSCULAR | Status: AC | PRN
  Administered 2018-03-05: 40 mg

## 2018-03-05 NOTE — Progress Notes (Signed)
Office Visit Note   Patient: Jillian Norton           Date of Birth: March 29, 1941           MRN: [id number] Visit Date: 03/05/2018              Requested by: Shon Baton, [place], [place] [address] PCP: Shon Baton, MD   Assessment & Plan: Visit Diagnoses:  1. De Quervain's tenosynovitis, right   2. Arthritis of carpometacarpal (CMC) joint of left thumb     Plan: Per her wishes I did inject the base of the left thumb as well as her radial styloid on the right side.  Also injected over the PIP joint of her right ring finger.  We will see her back the first week of September to assess her left knee.  I have an AP and lateral left knee at that visit and consider steroid injection before her trip to [place].  Follow-Up Instructions: Return in about 2 months (around 05/06/2018).   Orders:  Orders Placed This Encounter  Procedures  . Hand/UE Inj  . Hand/UE Inj   No orders of the defined types were placed in this encounter.     Procedures: Hand/UE Inj: R extensor compartment 1 for de Quervain's tenosynovitis on 03/05/2018 9:32 AM Medications: 1 mL lidocaine 1 %; 40 mg methylPREDNISolone acetate 40 MG/ML  Hand/UE Inj: L thumb CMC for osteoarthritis on 03/05/2018 9:33 AM Medications: 1 mL lidocaine 1 %; 40 mg methylPREDNISolone acetate 40 MG/ML      Clinical Data: No additional findings.   Subjective: Chief Complaint  Patient presents with  . Left Hand - Pain  . Right Wrist - Pain  The patient comes today with bilateral wrist and hand pain and would like to have an injection in both wrist today but is really the base of the thumb on both sides she like to injection.  One is for grinding at the [facility] joint of the left side and the other is for de Quervain's tenosynovitis in the right side.  She also will need to look at her ring finger on the right side due to swelling at the PIP joints where she points to.  She is actually going to [place] in September would  like to consider a steroid injection in her left knee right before that trip but not today.  The focus of today's visit is mainly on her hands.  She is an avid golfer  HPI  Review of Systems She currently denies any headache, chest pain, shortness of breath, fever, chills, nausea, vomiting.  Objective: Vital Signs: There were no vitals taken for this visit.  Physical Exam She is alert and oriented x3 and in no acute distress Ortho Exam Examination of both of her hands and wrists show pain over the [facility] joint with a positive grind test on the left side and over the radial styloid on the right side with a positive Finkelstein test.  She does have pain at the PIP joint of her right ring finger and likely a cyst in this area which could be a mucous cyst versus just a prominent bone spur.  The remainder of her hand exam is normal.  She has normal grip and pinch strength and normal sensation as well as well-perfused hands. Specialty Comments:  No specialty comments available.  Imaging: No results found.   PMFS History: Patient Active Problem List   Diagnosis Date Noted  . Arthritis of carpometacarpal (  CMC) joint of left thumb 03/05/2018  . De Quervain's tenosynovitis, right 03/05/2018  . Malignant neoplasm of upper-inner quadrant of right breast in female, estrogen receptor positive ([place]) 10/24/2017  . Overweight (BMI 25.0-29.9) 04/07/2017  . History of cancer of right breast 04/07/2017  . History of DVT (deep vein thrombosis) 04/07/2017  . GERD (gastroesophageal reflux disease) 11/01/2016  . Primary insomnia 11/01/2016  . Urge incontinence 11/01/2016  . History of colon polyps 11/01/2016  . Postnasal drip 11/01/2016  . Shingles 06/27/2016   Past Medical History:  Diagnosis Date  . Anxiety   . Breast cancer ([place]) 06/27/2012   Right  . Cataract   . DVT (deep venous thrombosis) ([place]) 06/28/2015  . Dyslipidemia   . GERD (gastroesophageal reflux disease)   . Insomnia   .  Osteopenia   . Overactive bladder   . Personal history of radiation therapy   . Shingles 06/2016  . Thyroid disease     Family History  Problem Relation Age of Onset  . Lung cancer Mother   . Colon cancer Father     Past Surgical History:  Procedure Laterality Date  . BREAST LUMPECTOMY Right 2014  . BREAST LUMPECTOMY  2013   right  . CATARACT EXTRACTION W/ INTRAOCULAR LENS IMPLANT Bilateral   . COLONOSCOPY  08/27/2012   [place]  . KNEE ARTHROSCOPY     left  . LASIK Bilateral   . REDUCTION MAMMAPLASTY Bilateral 1985   anchor scar   . VAGINAL HYSTERECTOMY  2002   Social History   Occupational History  . Not on file  Tobacco Use  . Smoking status: Former Smoker    Packs/day: 1.00    Years: 10.00    Pack years: 10.00    Types: Cigarettes  . Smokeless tobacco: Never Used  Substance and Sexual Activity  . Alcohol use: Yes    Comment: 1-2 drinks per day  . Drug use: No  . Sexual activity: Not Currently

## 2018-03-11 DIAGNOSIS — H16041 Marginal corneal ulcer, right eye: Secondary | ICD-10-CM | POA: Diagnosis not present

## 2018-03-19 DIAGNOSIS — H16041 Marginal corneal ulcer, right eye: Secondary | ICD-10-CM | POA: Diagnosis not present

## 2018-04-04 DIAGNOSIS — M859 Disorder of bone density and structure, unspecified: Secondary | ICD-10-CM | POA: Diagnosis not present

## 2018-04-04 DIAGNOSIS — R82998 Other abnormal findings in urine: Secondary | ICD-10-CM | POA: Diagnosis not present

## 2018-04-04 DIAGNOSIS — E7849 Other hyperlipidemia: Secondary | ICD-10-CM | POA: Diagnosis not present

## 2018-04-15 ENCOUNTER — Other Ambulatory Visit: Payer: Self-pay | Admitting: Internal Medicine

## 2018-04-15 DIAGNOSIS — Z23 Encounter for immunization: Secondary | ICD-10-CM | POA: Diagnosis not present

## 2018-04-15 DIAGNOSIS — Z1231 Encounter for screening mammogram for malignant neoplasm of breast: Secondary | ICD-10-CM

## 2018-04-18 DIAGNOSIS — Z1212 Encounter for screening for malignant neoplasm of rectum: Secondary | ICD-10-CM | POA: Diagnosis not present

## 2018-04-21 DIAGNOSIS — M859 Disorder of bone density and structure, unspecified: Secondary | ICD-10-CM | POA: Diagnosis not present

## 2018-04-25 DIAGNOSIS — H0015 Chalazion left lower eyelid: Secondary | ICD-10-CM | POA: Diagnosis not present

## 2018-04-25 DIAGNOSIS — H10413 Chronic giant papillary conjunctivitis, bilateral: Secondary | ICD-10-CM | POA: Diagnosis not present

## 2018-04-29 ENCOUNTER — Ambulatory Visit (INDEPENDENT_AMBULATORY_CARE_PROVIDER_SITE_OTHER): Payer: Medicare Other | Admitting: Orthopaedic Surgery

## 2018-04-29 ENCOUNTER — Ambulatory Visit (INDEPENDENT_AMBULATORY_CARE_PROVIDER_SITE_OTHER): Payer: Medicare Other

## 2018-04-29 ENCOUNTER — Encounter (INDEPENDENT_AMBULATORY_CARE_PROVIDER_SITE_OTHER): Payer: Self-pay | Admitting: Orthopaedic Surgery

## 2018-04-29 DIAGNOSIS — M25552 Pain in left hip: Secondary | ICD-10-CM

## 2018-04-29 DIAGNOSIS — M25562 Pain in left knee: Secondary | ICD-10-CM | POA: Diagnosis not present

## 2018-04-29 DIAGNOSIS — G8929 Other chronic pain: Secondary | ICD-10-CM

## 2018-04-29 MED ORDER — METHYLPREDNISOLONE ACETATE 40 MG/ML IJ SUSP
40.0000 mg | INTRAMUSCULAR | Status: AC | PRN
  Administered 2018-04-29: 40 mg via INTRA_ARTICULAR

## 2018-04-29 MED ORDER — LIDOCAINE HCL 1 % IJ SOLN
3.0000 mL | INTRAMUSCULAR | Status: AC | PRN
  Administered 2018-04-29: 3 mL

## 2018-04-29 NOTE — Progress Notes (Signed)
Office Visit Note   Patient: Jillian Norton           Date of Birth: 1941/07/22           MRN: [id number] Visit Date: 04/29/2018              Requested by: Shon Baton, [place], [place] [address] PCP: Shon Baton, MD   Assessment & Plan: Visit Diagnoses:  1. Pain in left hip   2. Chronic pain of left knee     Plan: I do feel that she has left hip trochanteric bursitis and will do well with a steroid shot in this area as well as stretching exercises and she agrees with this as well.  She also agreed to have a steroid injection in her left knee and she actually requested this as well again to help her on her trip overseas next week.  She tolerated these injections well.  All question concerns were answered and addressed.  Follow-up will be as needed  Follow-Up Instructions: Return if symptoms worsen or fail to improve.   Orders:  Orders Placed This Encounter  Procedures  . Large Joint Inj  . Large Joint Inj  . XR HIP UNILAT W OR W/O PELVIS 1V LEFT   No orders of the defined types were placed in this encounter.     Procedures: Large Joint Inj: L knee on 04/29/2018 11:08 AM Indications: diagnostic evaluation and pain Details: 22 G 1.5 in needle, superolateral approach  Arthrogram: No  Medications: 3 mL lidocaine 1 %; 40 mg methylPREDNISolone acetate 40 MG/ML Outcome: tolerated well, no immediate complications Procedure, treatment alternatives, risks and benefits explained, specific risks discussed. Consent was given by the patient. Immediately prior to procedure a time out was called to verify the correct patient, procedure, equipment, support staff and site/side marked as required. Patient was prepped and draped in the usual sterile fashion.   Large Joint Inj: L greater trochanter on 04/29/2018 11:09 AM Indications: pain and diagnostic evaluation Details: 22 G 1.5 in needle, lateral approach  Arthrogram: No  Medications: 3 mL lidocaine 1 %; 40 mg  methylPREDNISolone acetate 40 MG/ML Outcome: tolerated well, no immediate complications Procedure, treatment alternatives, risks and benefits explained, specific risks discussed. Consent was given by the patient. Immediately prior to procedure a time out was called to verify the correct patient, procedure, equipment, support staff and site/side marked as required. Patient was prepped and draped in the usual sterile fashion.       Clinical Data: No additional findings.   Subjective: Chief Complaint  Patient presents with  . Left Knee - Pain  . Right Foot - Pain  Patient is actually coming in today for evaluation treatment of left knee pain and left hip pain.  Her left hip pain is over the trochanteric area and her left knee pain is global.  She is heading [place] for [place] next week.  She would like to have an injection in both areas today if clinically indicated.  She denies any locking catching.  She denies any groin pain or back pain.  She does a lot of walking and is very active individual.  She is well-known to me.  HPI  Review of Systems She currently denies any headache, chest pain, shortness of breath, fever, chills, nausea, vomiting.  Objective: Vital Signs: There were no vitals taken for this visit.  Physical Exam She is alert and oriented x3 and in no acute distress Ortho Exam Examination of  her left hip shows fluid range of motion of the left hip with no pain in the groin at all.  Her pain is only to palpation over the trochanteric area and IT band.  Her left knee exam shows some patellofemoral crepitation but excellent range of motion overall which is global pain. Specialty Comments:  No specialty comments available.  Imaging: Xr Hip Unilat W Or W/o Pelvis 1v Left  Result Date: 04/29/2018 An AP pelvis and a lateral of the left hip shows a well-maintained joint space with no significant arthritic changes or acute findings.    PMFS History: Patient Active  Problem List   Diagnosis Date Noted  . Arthritis of carpometacarpal [facility]) joint of left thumb 03/05/2018  . De Quervain's tenosynovitis, right 03/05/2018  . Malignant neoplasm of upper-inner quadrant of right breast in female, estrogen receptor positive ([place]) 10/24/2017  . Overweight (BMI 25.0-29.9) 04/07/2017  . History of cancer of right breast 04/07/2017  . History of DVT (deep vein thrombosis) 04/07/2017  . GERD (gastroesophageal reflux disease) 11/01/2016  . Primary insomnia 11/01/2016  . Urge incontinence 11/01/2016  . History of colon polyps 11/01/2016  . Postnasal drip 11/01/2016  . Shingles 06/27/2016   Past Medical History:  Diagnosis Date  . Anxiety   . Breast cancer ([place]) 06/27/2012   Right  . Cataract   . DVT (deep venous thrombosis) ([place]) 06/28/2015  . Dyslipidemia   . GERD (gastroesophageal reflux disease)   . Insomnia   . Osteopenia   . Overactive bladder   . Personal history of radiation therapy   . Shingles 06/2016  . Thyroid disease     Family History  Problem Relation Age of Onset  . Lung cancer Mother   . Colon cancer Father     Past Surgical History:  Procedure Laterality Date  . BREAST LUMPECTOMY Right 2014  . BREAST LUMPECTOMY  2013   right  . CATARACT EXTRACTION W/ INTRAOCULAR LENS IMPLANT Bilateral   . COLONOSCOPY  08/27/2012   [place]  . KNEE ARTHROSCOPY     left  . LASIK Bilateral   . REDUCTION MAMMAPLASTY Bilateral 1985   anchor scar   . VAGINAL HYSTERECTOMY  2002   Social History   Occupational History  . Not on file  Tobacco Use  . Smoking status: Former Smoker    Packs/day: 1.00    Years: 10.00    Pack years: 10.00    Types: Cigarettes  . Smokeless tobacco: Never Used  Substance and Sexual Activity  . Alcohol use: Yes    Comment: 1-2 drinks per day  . Drug use: No  . Sexual activity: Not Currently

## 2018-04-30 ENCOUNTER — Ambulatory Visit (INDEPENDENT_AMBULATORY_CARE_PROVIDER_SITE_OTHER): Payer: Self-pay | Admitting: Orthopaedic Surgery

## 2018-06-02 ENCOUNTER — Ambulatory Visit
Admission: RE | Admit: 2018-06-02 | Discharge: 2018-06-02 | Disposition: A | Discharge status: Home / Self Care | Payer: Medicare Other | Source: Ambulatory Visit | Attending: Internal Medicine | Admitting: Internal Medicine

## 2018-06-02 DIAGNOSIS — Z1231 Encounter for screening mammogram for malignant neoplasm of breast: Secondary | ICD-10-CM | POA: Diagnosis not present

## 2018-08-14 DIAGNOSIS — D1801 Hemangioma of skin and subcutaneous tissue: Secondary | ICD-10-CM | POA: Diagnosis not present

## 2018-08-14 DIAGNOSIS — L720 Epidermal cyst: Secondary | ICD-10-CM | POA: Diagnosis not present

## 2018-08-14 DIAGNOSIS — L821 Other seborrheic keratosis: Secondary | ICD-10-CM | POA: Diagnosis not present

## 2018-08-14 DIAGNOSIS — D485 Neoplasm of uncertain behavior of skin: Secondary | ICD-10-CM | POA: Diagnosis not present

## 2018-09-10 DIAGNOSIS — Z85820 Personal history of malignant melanoma of skin: Secondary | ICD-10-CM | POA: Diagnosis not present

## 2018-09-10 DIAGNOSIS — Z23 Encounter for immunization: Secondary | ICD-10-CM | POA: Diagnosis not present

## 2018-09-10 DIAGNOSIS — L814 Other melanin hyperpigmentation: Secondary | ICD-10-CM | POA: Diagnosis not present

## 2018-09-10 DIAGNOSIS — L821 Other seborrheic keratosis: Secondary | ICD-10-CM | POA: Diagnosis not present

## 2018-09-10 DIAGNOSIS — D225 Melanocytic nevi of trunk: Secondary | ICD-10-CM | POA: Diagnosis not present

## 2018-09-25 DIAGNOSIS — G5601 Carpal tunnel syndrome, right upper limb: Secondary | ICD-10-CM | POA: Diagnosis not present

## 2018-09-25 DIAGNOSIS — M79641 Pain in right hand: Secondary | ICD-10-CM | POA: Diagnosis not present

## 2018-09-25 DIAGNOSIS — Z6829 Body mass index (BMI) 29.0-29.9, adult: Secondary | ICD-10-CM | POA: Diagnosis not present

## 2018-10-16 DIAGNOSIS — G5602 Carpal tunnel syndrome, left upper limb: Secondary | ICD-10-CM | POA: Diagnosis not present

## 2018-10-16 DIAGNOSIS — G5601 Carpal tunnel syndrome, right upper limb: Secondary | ICD-10-CM | POA: Diagnosis not present

## 2018-10-27 ENCOUNTER — Emergency Department (HOSPITAL_BASED_OUTPATIENT_CLINIC_OR_DEPARTMENT_OTHER): Payer: Medicare Other

## 2018-10-27 ENCOUNTER — Emergency Department (HOSPITAL_COMMUNITY)
Admission: EM | Admit: 2018-10-27 | Discharge: 2018-10-27 | Disposition: A | Discharge status: Home / Self Care | Payer: Medicare Other | Attending: Emergency Medicine | Admitting: Emergency Medicine

## 2018-10-27 ENCOUNTER — Other Ambulatory Visit: Payer: Self-pay

## 2018-10-27 ENCOUNTER — Encounter (HOSPITAL_COMMUNITY): Payer: Self-pay

## 2018-10-27 DIAGNOSIS — S86912A Strain of unspecified muscle(s) and tendon(s) at lower leg level, left leg, initial encounter: Secondary | ICD-10-CM | POA: Diagnosis not present

## 2018-10-27 DIAGNOSIS — Y939 Activity, unspecified: Secondary | ICD-10-CM | POA: Insufficient documentation

## 2018-10-27 DIAGNOSIS — Z87891 Personal history of nicotine dependence: Secondary | ICD-10-CM | POA: Insufficient documentation

## 2018-10-27 DIAGNOSIS — Z79899 Other long term (current) drug therapy: Secondary | ICD-10-CM | POA: Diagnosis not present

## 2018-10-27 DIAGNOSIS — Y929 Unspecified place or not applicable: Secondary | ICD-10-CM | POA: Diagnosis not present

## 2018-10-27 DIAGNOSIS — X58XXXA Exposure to other specified factors, initial encounter: Secondary | ICD-10-CM | POA: Diagnosis not present

## 2018-10-27 DIAGNOSIS — S46911A Strain of unspecified muscle, fascia and tendon at shoulder and upper arm level, right arm, initial encounter: Secondary | ICD-10-CM | POA: Diagnosis not present

## 2018-10-27 DIAGNOSIS — M79604 Pain in right leg: Secondary | ICD-10-CM | POA: Diagnosis present

## 2018-10-27 DIAGNOSIS — R52 Pain, unspecified: Secondary | ICD-10-CM

## 2018-10-27 DIAGNOSIS — Y999 Unspecified external cause status: Secondary | ICD-10-CM | POA: Diagnosis not present

## 2018-10-27 DIAGNOSIS — S86811A Strain of other muscle(s) and tendon(s) at lower leg level, right leg, initial encounter: Secondary | ICD-10-CM

## 2018-10-27 NOTE — ED Provider Notes (Signed)
[facility] EMERGENCY DEPARTMENT Provider Note   CSN: [id number] Arrival date & time: 10/27/18  1321    History   Chief Complaint Chief Complaint  Patient presents with  . right leg warmth/ ?DVT    HPI Jillian Norton is a 78 y.o. female.     78 year old female with prior medical history as detailed below presents for evaluation of right calf pain.  Patient with prior history of DVT in 2016.  Patient reports that this morning she woke up and she had some pain to the right mid calf.  She denies any specific injury.  She does report that she was practicing her golf swing yesterday.  She denies chest pain or shortness of breath.  She talked with her regular physician today who told her to come to the ED for an ultrasound to rule out a DVT.  The history is provided by the patient and medical records.  Illness  Location:  Right calf pain Severity:  Mild Onset quality:  Gradual Duration:  8 hours Timing:  Constant Progression:  Partially resolved Chronicity:  New Associated symptoms: no chest pain and no shortness of breath     Past Medical History:  Diagnosis Date  . Anxiety   . Breast cancer ([place]) 06/27/2012   Right  . Cataract   . DVT (deep venous thrombosis) ([place]) 06/28/2015  . Dyslipidemia   . GERD (gastroesophageal reflux disease)   . Insomnia   . Osteopenia   . Overactive bladder   . Personal history of radiation therapy   . Shingles 06/2016  . Thyroid disease     Patient Active Problem List   Diagnosis Date Noted  . Arthritis of carpometacarpal [facility]) joint of left thumb 03/05/2018  . De Quervain's tenosynovitis, right 03/05/2018  . Malignant neoplasm of upper-inner quadrant of right breast in female, estrogen receptor positive ([place]) 10/24/2017  . Overweight (BMI 25.0-29.9) 04/07/2017  . History of cancer of right breast 04/07/2017  . History of DVT (deep vein thrombosis) 04/07/2017  . GERD (gastroesophageal reflux disease) 11/01/2016  .  Primary insomnia 11/01/2016  . Urge incontinence 11/01/2016  . History of colon polyps 11/01/2016  . Postnasal drip 11/01/2016  . Shingles 06/27/2016    Past Surgical History:  Procedure Laterality Date  . BREAST LUMPECTOMY Right 2014  . BREAST LUMPECTOMY  2013   right  . CATARACT EXTRACTION W/ INTRAOCULAR LENS IMPLANT Bilateral   . COLONOSCOPY  08/27/2012   [place]  . KNEE ARTHROSCOPY     left  . LASIK Bilateral   . REDUCTION MAMMAPLASTY Bilateral 1985   anchor scar   . VAGINAL HYSTERECTOMY  2002     OB History   No obstetric history on file.      Home Medications    Prior to Admission medications   Medication Sig Start Date End Date Taking? Authorizing Provider  acetaminophen (TYLENOL) 325 MG tablet Take 650 mg by mouth as needed. Pt stated, "When I have pain, takes 3 tablets at a time for the pain; it does help"    [provider]  anastrozole (ARIMIDEX) 1 MG tablet Take 1 tablet (1 mg total) by mouth daily. 10/24/17   Magrinat, Virgie Dad, MD  Diclofenac Sodium (VOLTAREN EX) Apply topically as needed.    [provider]  escitalopram (LEXAPRO) 10 MG tablet  09/18/17   [provider]  omeprazole (PRILOSEC) 40 MG capsule Take 1 capsule (40 mg total) by mouth daily. 07/26/17   Hollace Kinnier  L, DO  solifenacin (VESICARE) 5 MG tablet Take 5 mg by mouth daily.    [provider]  terbinafine (LAMISIL) 250 MG tablet Please take one a day x 7days, repeat every 4 weeks x 4 months 08/09/17   Wallene Huh, DPM  zolpidem (AMBIEN) 10 MG tablet Take 1 tablet (10 mg total) by mouth at bedtime as needed for sleep. 08/09/17   Gayland Curry, DO    Family History Family History  Problem Relation Age of Onset  . Lung cancer Mother   . Colon cancer Father     Social History Social History   Tobacco Use  . Smoking status: Former Smoker    Packs/day: 1.00    Years: 10.00    Pack years: 10.00    Types: Cigarettes  . Smokeless tobacco: Never  Used  Substance Use Topics  . Alcohol use: Yes    Comment: 1-2 drinks per day  . Drug use: No     Allergies   Sporanos [itraconazole]   Review of Systems Review of Systems  Respiratory: Negative for shortness of breath.   Cardiovascular: Negative for chest pain.  All other systems reviewed and are negative.    Physical Exam Updated Vital Signs BP (!) 141/73   Pulse 62   Temp 98.1 F (36.7 C) (Oral)   Resp 17   SpO2 100%   Physical Exam Vitals signs and nursing note reviewed.  Constitutional:      General: She is not in acute distress.    Appearance: She is well-developed.  HENT:     Head: Normocephalic and atraumatic.  Eyes:     Conjunctiva/sclera: Conjunctivae normal.     Pupils: Pupils are equal, round, and reactive to light.  Neck:     Musculoskeletal: Normal range of motion and neck supple.  Cardiovascular:     Rate and Rhythm: Normal rate and regular rhythm.     Heart sounds: Normal heart sounds.  Pulmonary:     Effort: Pulmonary effort is normal. No respiratory distress.     Breath sounds: Normal breath sounds.  Abdominal:     General: There is no distension.     Palpations: Abdomen is soft.     Tenderness: There is no abdominal tenderness.  Musculoskeletal: Normal range of motion.        General: No deformity.     Comments: Tenderness to the right posterior calf.  No appreciable edema noted.  Distal right lower extremity is neurovascular intact with good pulses and no evidence of acute ischemia.   Skin:    General: Skin is warm and dry.  Neurological:     General: No focal deficit present.     Mental Status: She is alert and oriented to person, place, and time. Mental status is at baseline.      ED Treatments / Results  Labs (all labs ordered are listed, but only abnormal results are displayed) Labs Reviewed - No data to display  EKG EKG Interpretation  Date/Time:  Monday October 27 2018 13:33:15 EST Ventricular Rate:  68 PR Interval:      QRS Duration: 98 QT Interval:  397 QTC Calculation: 423 R Axis:   3 Text Interpretation:  Sinus rhythm Confirmed by Dene Gentry [phone]) on 10/27/2018 1:38:17 PM   Radiology Vas [place] Lower Extremity Venous (dvt) (only Mc & Wl 7a-7p)  Result Date: 10/27/2018  Lower Venous Study Indications: Pain.  Comparison Study: Previous study done 04/10/17 is available for comparison Performing Technologist:  Sharion Dove RVS  Examination Guidelines: A complete evaluation includes B-mode imaging, spectral Doppler, color Doppler, and power Doppler as needed of all accessible portions of each vessel. Bilateral testing is considered an integral part of a complete examination. Limited examinations for reoccurring indications may be performed as noted.  Right Venous Findings: +---------+---------------+---------+-----------+----------+-------+          CompressibilityPhasicitySpontaneityPropertiesSummary +---------+---------------+---------+-----------+----------+-------+ CFV      Full           Yes      Yes                          +---------+---------------+---------+-----------+----------+-------+ SFJ      Full                                                 +---------+---------------+---------+-----------+----------+-------+ FV Prox  Full                                                 +---------+---------------+---------+-----------+----------+-------+ FV Mid   Full                                                 +---------+---------------+---------+-----------+----------+-------+ FV DistalFull                                                 +---------+---------------+---------+-----------+----------+-------+ PFV      Full                                                 +---------+---------------+---------+-----------+----------+-------+ POP      Full           Yes      Yes                          +---------+---------------+---------+-----------+----------+-------+  PTV      Full                                                 +---------+---------------+---------+-----------+----------+-------+ PERO     Full                                                 +---------+---------------+---------+-----------+----------+-------+ GSV      Full                                                 +---------+---------------+---------+-----------+----------+-------+  Left Venous Findings: +---+---------------+---------+-----------+----------+-------+    CompressibilityPhasicitySpontaneityPropertiesSummary +---+---------------+---------+-----------+----------+-------+ CFVFull           Yes      Yes                          +---+---------------+---------+-----------+----------+-------+    Summary: Right: There is no evidence of deep vein thrombosis in the lower extremity.There is no evidence of superficial venous thrombosis. Left: No evidence of common femoral vein obstruction.  *See table(s) above for measurements and observations.    Preliminary     Procedures Procedures (including critical care time)  Medications Ordered in ED Medications - No data to display   Initial Impression / Assessment and Plan / ED Course  I have reviewed the triage vital signs and the nursing notes.  Pertinent labs & imaging results that were available during my care of the patient were reviewed by me and considered in my medical decision making (see chart for details).        MDM  Screen complete  Patient is presenting for evaluation of right sided lower extremity pain.  Patient's exam is not entirely suggestive of a DVT.  Ultrasound does not show evidence of DVT.    Patient likely suffering from a muscle strain.  She does understand the need for close follow-up.  Strict return precautions given and understood.  Final Clinical Impressions(s) / ED Diagnoses   Final diagnoses:  Strain of calf muscle, right, initial encounter    ED Discharge Orders     None       Valarie Merino, MD 10/27/18 1430

## 2018-10-27 NOTE — Progress Notes (Signed)
VASCULAR LAB PRELIMINARY  PRELIMINARY  PRELIMINARY  PRELIMINARY  Right lower extremity venous duplex completed.    Preliminary report:  See CV Proc  Gave Dr. Francia Greaves results  [facility], [facility], RVT 10/27/2018, 2:22 PM

## 2018-10-27 NOTE — Discharge Instructions (Signed)
Please return for any problem.  Follow-up with your regular care provider as instructed.  Your ultrasound today was negative for DVT.  If you have continued symptoms for the next 1 to 2 weeks you should obtain a repeat ultrasound.

## 2018-10-27 NOTE — ED Notes (Signed)
RN informed Pt can receive visitor  

## 2018-10-27 NOTE — ED Triage Notes (Signed)
Patient complains of right leg calf  pain and feeling warm this am. Denies trauma. States that Jillian Norton had DVT to right calf in 2016 and no longer takes blood thinner for same. Also reports that Jillian Norton felt SOB this am, sats 100%, no CP, NAD. No swelling to leg, no redness, positive distal pulses

## 2019-01-21 ENCOUNTER — Other Ambulatory Visit: Payer: Self-pay

## 2019-01-21 ENCOUNTER — Ambulatory Visit (INDEPENDENT_AMBULATORY_CARE_PROVIDER_SITE_OTHER): Payer: Medicare Other | Admitting: Orthopaedic Surgery

## 2019-01-21 ENCOUNTER — Ambulatory Visit (INDEPENDENT_AMBULATORY_CARE_PROVIDER_SITE_OTHER): Payer: Medicare Other

## 2019-01-21 ENCOUNTER — Encounter: Payer: Self-pay | Admitting: Orthopaedic Surgery

## 2019-01-21 DIAGNOSIS — M2141 Flat foot [pes planus] (acquired), right foot: Secondary | ICD-10-CM | POA: Diagnosis not present

## 2019-01-21 DIAGNOSIS — M79641 Pain in right hand: Secondary | ICD-10-CM | POA: Diagnosis not present

## 2019-01-21 NOTE — Progress Notes (Signed)
Office Visit Note   Patient: Jillian Norton           Date of Birth: 03-10-1941           MRN: [id number] Visit Date: 01/21/2019              Requested by: Shon Baton, [place], [place] [address] PCP: Shon Baton, MD   Assessment & Plan: Visit Diagnoses:  1. 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  In regards to both the right ring finger and foot recommend use of Voltaren gel which she has at home.  Recommend that she be good about her shoewear using something that has an arch support she can also pick up the arch support at local sporting goods stores could definitely give her some support.  She is not interested in any type of surgical intervention of the right foot at this time.  In regards to the right hand numbness tingling we will have her undergo EMG nerve conduction studies to evaluate for carpal tunnel syndrome.  Questions were encouraged and answered by Dr. Ninfa Linden and myself.  Follow-Up Instructions: Return for After EMG/NCS.   Orders:  Orders Placed This Encounter  Procedures  . XR Hand Complete Right   No orders of the defined types were placed in this encounter.     Procedures: No procedures performed   Clinical Data: No additional findings.   Subjective: Chief Complaint  Patient presents with  . Right Hand - Pain    HPI Jillian Norton returns today for right hand numbness tingling and right ring finger pain.  She has had no new injury.  She states that the right hand numbness does go up to her arm.  She denies any neck pain or radicular symptoms down the arm.  She states that the numbness involves the thumb index long finger and part of the ring finger.  She is worn a night splint at night which has helped some she still awakened by the numbness tingling in the  hand.  Driving also causes numbness in the right hand. Right ring finger swelling at the PIP joint.  Again no known injury.  Difficulty putting on jewelry due to the swelling of the ring finger.  She is tried Voltaren gel twice daily with no significant relief. Patient also asked about right foot pain and swelling.  No known injury.  She seen podiatrist in the past is been given orthotics both soft and rigid.  Uses Voltaren gel on the right foot.  Review of Systems No fevers or chills.  See HPI otherwise negative or noncontributory.  Objective: Vital Signs: There were no vitals taken for this visit.  Physical Exam Constitutional:      Appearance: She is not ill-appearing or diaphoretic.  Pulmonary:     Effort: Pulmonary effort is normal.  Neurological:     Mental Status: She is alert and oriented to person, place, and time.  Psychiatric:        Mood and Affect: Mood normal.     Ortho Exam Right hand neurovascularly intact.  Negative Tinel's over the median nerve negative few degrees and full extension of the right ring finger.  Bilaterally.  Compression test of the right median nerve negative.  She is able to make a fist.Lacks a few  degrees of full extension of the right ring finger.  Tenderness at the PIP joint.  There is no triggering of any fingers right hand.  No rashes skin lesions ulcerations.  Mild edema at the right ring finger PIP joint only.  Remainder the hand is nontender. Right foot pes planovalgus deformity.  5 out of 5 strengths inversion eversion right pain.  She has difficulty doing heel raise on the right with pain.  However is able to perform is easily on the left without pain.  Prominence over the navicular region of the right foot.  Nontender of the posterior tibial tendon.  Specialty Comments:  No specialty comments available.  Imaging: Xr Hand Complete Right  Result Date: 01/21/2019 Right hand 3 views: Arthritic changes particularly of the ring finger PIP joint.   Soft tissue edema about the PIP joint is noted.  No bony abnormalities or acute fractures.    PMFS History: Patient Active Problem List   Diagnosis Date Noted  . Arthritis of carpometacarpal [facility]) joint of left thumb 03/05/2018  . De Quervain's tenosynovitis, right 03/05/2018  . Malignant neoplasm of upper-inner quadrant of right breast in female, estrogen receptor positive ([place]) 10/24/2017  . Overweight (BMI 25.0-29.9) 04/07/2017  . History of cancer of right breast 04/07/2017  . History of DVT (deep vein thrombosis) 04/07/2017  . GERD (gastroesophageal reflux disease) 11/01/2016  . Primary insomnia 11/01/2016  . Urge incontinence 11/01/2016  . History of colon polyps 11/01/2016  . Postnasal drip 11/01/2016  . Shingles 06/27/2016   Past Medical History:  Diagnosis Date  . Anxiety   . Breast cancer ([place]) 06/27/2012   Right  . Cataract   . DVT (deep venous thrombosis) ([place]) 06/28/2015  . Dyslipidemia   . GERD (gastroesophageal reflux disease)   . Insomnia   . Osteopenia   . Overactive bladder   . Personal history of radiation therapy   . Shingles 06/2016  . Thyroid disease     Family History  Problem Relation Age of Onset  . Lung cancer Mother   . Colon cancer Father     Past Surgical History:  Procedure Laterality Date  . BREAST LUMPECTOMY Right 2014  . BREAST LUMPECTOMY  2013   right  . CATARACT EXTRACTION W/ INTRAOCULAR LENS IMPLANT Bilateral   . COLONOSCOPY  08/27/2012   [place]  . KNEE ARTHROSCOPY     left  . LASIK Bilateral   . REDUCTION MAMMAPLASTY Bilateral 1985   anchor scar   . VAGINAL HYSTERECTOMY  2002   Social History   Occupational History  . Not on file  Tobacco Use  . Smoking status: Former Smoker    Packs/day: 1.00    Years: 10.00    Pack years: 10.00    Types: Cigarettes  . Smokeless tobacco: Never Used  Substance and Sexual Activity  . Alcohol use: Yes    Comment: 1-2 drinks per day  . Drug use: No  . Sexual activity: Not  Currently

## 2019-01-24 ENCOUNTER — Other Ambulatory Visit: Payer: Medicare Other

## 2019-01-24 ENCOUNTER — Telehealth: Payer: Self-pay

## 2019-01-24 DIAGNOSIS — Z20822 Contact with and (suspected) exposure to covid-19: Secondary | ICD-10-CM

## 2019-01-24 NOTE — Telephone Encounter (Signed)
Pt has been schedule for today at 1030 am at [facility]

## 2019-01-26 LAB — NOVEL CORONAVIRUS, NAA: SARS-CoV-2, NAA: NOT DETECTED

## 2019-03-16 DIAGNOSIS — L649 Androgenic alopecia, unspecified: Secondary | ICD-10-CM | POA: Diagnosis not present

## 2019-03-16 DIAGNOSIS — C44722 Squamous cell carcinoma of skin of right lower limb, including hip: Secondary | ICD-10-CM | POA: Diagnosis not present

## 2019-03-16 DIAGNOSIS — L570 Actinic keratosis: Secondary | ICD-10-CM | POA: Diagnosis not present

## 2019-03-16 DIAGNOSIS — L821 Other seborrheic keratosis: Secondary | ICD-10-CM | POA: Diagnosis not present

## 2019-03-18 DIAGNOSIS — H6123 Impacted cerumen, bilateral: Secondary | ICD-10-CM | POA: Diagnosis not present

## 2019-04-13 DIAGNOSIS — M859 Disorder of bone density and structure, unspecified: Secondary | ICD-10-CM | POA: Diagnosis not present

## 2019-04-13 DIAGNOSIS — Z23 Encounter for immunization: Secondary | ICD-10-CM | POA: Diagnosis not present

## 2019-04-13 DIAGNOSIS — E7849 Other hyperlipidemia: Secondary | ICD-10-CM | POA: Diagnosis not present

## 2019-04-16 DIAGNOSIS — R82998 Other abnormal findings in urine: Secondary | ICD-10-CM | POA: Diagnosis not present

## 2019-04-22 DIAGNOSIS — L570 Actinic keratosis: Secondary | ICD-10-CM | POA: Diagnosis not present

## 2019-04-22 DIAGNOSIS — D485 Neoplasm of uncertain behavior of skin: Secondary | ICD-10-CM | POA: Diagnosis not present

## 2019-04-22 DIAGNOSIS — Z85820 Personal history of malignant melanoma of skin: Secondary | ICD-10-CM | POA: Diagnosis not present

## 2019-04-22 DIAGNOSIS — Z85828 Personal history of other malignant neoplasm of skin: Secondary | ICD-10-CM | POA: Diagnosis not present

## 2019-04-22 DIAGNOSIS — D225 Melanocytic nevi of trunk: Secondary | ICD-10-CM | POA: Diagnosis not present

## 2019-04-22 DIAGNOSIS — D1801 Hemangioma of skin and subcutaneous tissue: Secondary | ICD-10-CM | POA: Diagnosis not present

## 2019-04-22 DIAGNOSIS — L814 Other melanin hyperpigmentation: Secondary | ICD-10-CM | POA: Diagnosis not present

## 2019-04-22 DIAGNOSIS — L821 Other seborrheic keratosis: Secondary | ICD-10-CM | POA: Diagnosis not present

## 2019-05-18 DIAGNOSIS — Z1212 Encounter for screening for malignant neoplasm of rectum: Secondary | ICD-10-CM | POA: Diagnosis not present

## 2019-05-21 ENCOUNTER — Other Ambulatory Visit: Payer: Self-pay | Admitting: Internal Medicine

## 2019-05-21 DIAGNOSIS — Z1231 Encounter for screening mammogram for malignant neoplasm of breast: Secondary | ICD-10-CM

## 2019-06-16 ENCOUNTER — Other Ambulatory Visit: Payer: Self-pay

## 2019-06-16 DIAGNOSIS — Z20822 Contact with and (suspected) exposure to covid-19: Secondary | ICD-10-CM

## 2019-06-17 LAB — NOVEL CORONAVIRUS, NAA: SARS-CoV-2, NAA: NOT DETECTED

## 2019-07-06 ENCOUNTER — Ambulatory Visit
Admission: RE | Admit: 2019-07-06 | Discharge: 2019-07-06 | Disposition: A | Discharge status: Home / Self Care | Payer: Medicare Other | Source: Ambulatory Visit | Attending: Internal Medicine | Admitting: Internal Medicine

## 2019-07-06 ENCOUNTER — Other Ambulatory Visit: Payer: Self-pay

## 2019-07-06 DIAGNOSIS — Z1231 Encounter for screening mammogram for malignant neoplasm of breast: Secondary | ICD-10-CM | POA: Diagnosis not present

## 2019-09-09 DIAGNOSIS — Z23 Encounter for immunization: Secondary | ICD-10-CM | POA: Diagnosis not present

## 2019-09-15 DIAGNOSIS — H01001 Unspecified blepharitis right upper eyelid: Secondary | ICD-10-CM | POA: Diagnosis not present

## 2019-10-07 DIAGNOSIS — Z23 Encounter for immunization: Secondary | ICD-10-CM | POA: Diagnosis not present

## 2019-10-23 DIAGNOSIS — L570 Actinic keratosis: Secondary | ICD-10-CM | POA: Diagnosis not present

## 2019-10-23 DIAGNOSIS — L720 Epidermal cyst: Secondary | ICD-10-CM | POA: Diagnosis not present

## 2019-10-23 DIAGNOSIS — D225 Melanocytic nevi of trunk: Secondary | ICD-10-CM | POA: Diagnosis not present

## 2019-10-23 DIAGNOSIS — L821 Other seborrheic keratosis: Secondary | ICD-10-CM | POA: Diagnosis not present

## 2019-10-23 DIAGNOSIS — L814 Other melanin hyperpigmentation: Secondary | ICD-10-CM | POA: Diagnosis not present

## 2019-10-23 DIAGNOSIS — Z85820 Personal history of malignant melanoma of skin: Secondary | ICD-10-CM | POA: Diagnosis not present

## 2019-10-23 DIAGNOSIS — Z85828 Personal history of other malignant neoplasm of skin: Secondary | ICD-10-CM | POA: Diagnosis not present

## 2019-10-23 DIAGNOSIS — D1801 Hemangioma of skin and subcutaneous tissue: Secondary | ICD-10-CM | POA: Diagnosis not present

## 2019-10-23 DIAGNOSIS — D2261 Melanocytic nevi of right upper limb, including shoulder: Secondary | ICD-10-CM | POA: Diagnosis not present

## 2019-11-17 ENCOUNTER — Encounter: Payer: Self-pay | Admitting: Orthopaedic Surgery

## 2019-11-17 ENCOUNTER — Ambulatory Visit (INDEPENDENT_AMBULATORY_CARE_PROVIDER_SITE_OTHER): Payer: Medicare Other

## 2019-11-17 ENCOUNTER — Ambulatory Visit (INDEPENDENT_AMBULATORY_CARE_PROVIDER_SITE_OTHER): Payer: Medicare Other | Admitting: Orthopaedic Surgery

## 2019-11-17 ENCOUNTER — Other Ambulatory Visit: Payer: Self-pay

## 2019-11-17 ENCOUNTER — Ambulatory Visit: Payer: Self-pay

## 2019-11-17 DIAGNOSIS — M25562 Pain in left knee: Secondary | ICD-10-CM

## 2019-11-17 DIAGNOSIS — M79641 Pain in right hand: Secondary | ICD-10-CM

## 2019-11-17 DIAGNOSIS — M79671 Pain in right foot: Secondary | ICD-10-CM

## 2019-11-17 DIAGNOSIS — M79672 Pain in left foot: Secondary | ICD-10-CM | POA: Diagnosis not present

## 2019-11-17 MED ORDER — METHYLPREDNISOLONE ACETATE 40 MG/ML IJ SUSP
40.0000 mg | INTRAMUSCULAR | Status: AC | PRN
  Administered 2019-11-17: 40 mg via INTRA_ARTICULAR

## 2019-11-17 MED ORDER — LIDOCAINE HCL 1 % IJ SOLN
3.0000 mL | INTRAMUSCULAR | Status: AC | PRN
  Administered 2019-11-17: 3 mL

## 2019-11-17 NOTE — Progress Notes (Signed)
Office Visit Note   Patient: Jillian Norton           Date of Birth: 26-Jan-1941           MRN: [id number] Visit Date: 11/17/2019              Requested by: Shon Baton, [address],  [place] [address] PCP: Shon Baton, MD   Assessment & Plan: Visit Diagnoses:  1. Left knee pain, unspecified chronicity   2. Pain in right foot   3. Pain in right hand     Plan: The patient did request a steroid injection in her left knee and I have recommended this as well.  I think we did this remotely in the past as to she.  She understands the risk and benefits of steroid injections and tolerated it well.  I have recommended shoe wear that involves more of a wider toe box such as tennis shoes or other flats or shoes that can allow her forefoot to have more space.  She will try Voltaren gel as a topical anti-inflammatory for her midfoot pain in her hand pain.  All questions concerns were answered and addressed.  She did tolerate the steroid injection well today.  Follow-up can be as needed.  Follow-Up Instructions: Return if symptoms worsen or fail to improve.   Orders:  Orders Placed This Encounter  Procedures  . Large Joint Inj: L knee  . XR Foot Complete Right  . XR Knee 1-2 Views Left   No orders of the defined types were placed in this encounter.     Procedures: Large Joint Inj: L knee on 11/17/2019 9:58 AM Indications: diagnostic evaluation and pain Details: 22 G 1.5 in needle, superolateral approach  Arthrogram: No  Medications: 3 mL lidocaine 1 %; 40 mg methylPREDNISolone acetate 40 MG/ML Outcome: tolerated well, no immediate complications Procedure, treatment alternatives, risks and benefits explained, specific risks discussed. Consent was given by the patient. Immediately prior to procedure a time out was called to verify the correct patient, procedure, equipment, support staff and site/side marked as required. Patient was prepped and draped in the usual sterile  fashion.       Clinical Data: No additional findings.   Subjective: Chief Complaint  Patient presents with  . Left Knee - Pain  . Right Foot - Pain  The patient is well-known to the practice.  We have not seen her in a while though.  She is very active 79 year old female.  She comes in today for evaluation treatment of left knee pain as well as right hand pain and right foot pain.  She denies any injuries that she is aware of.  She is wanting to get back into playing golf.  She says her left knee is hurting with ambulation and weightbearing but is been no swelling or giving way.  She does not wake with pain at night.  There is been no clicking locking or popping.  Is been more of just a global tenderness.  Her right foot hurts at the fourth and fifth toes.  She is worried about the fourth toe turned again some.  She does get some midfoot pain on the right foot.  She also has right hand pain and is mainly at the IP joints is where she points to.  She denies any numbness and tingling in her hands.  She is not a diabetic.  She has had her COVID-19 vaccinations with the second 1 being back in  February.  She denies any other acute medical issues.  She is not a smoker.  HPI  Review of Systems She currently denies any headache, chest pain, shortness of breath, fever, chills, nausea, vomiting  Objective: Vital Signs: There were no vitals taken for this visit.  Physical Exam She is alert and orient x3 and in no acute distress Ortho Exam Examination of her right hand shows just some slight tenderness and pain at the IP joints but otherwise good grip strength and a normal neurovascular exam.  Examination of her left knee shows no effusion at all.  Her left knee range of motion is full.  There is just some slight tenderness globally but no instability on ligamentous exam.  Examination of her right foot does show some slight turning in of her fourth toe.  There is midfoot pain as well.  Just examining  her shoes she does wear shoes that with a narrow toe box. Specialty Comments:  No specialty comments available.  Imaging: XR Foot Complete Right  Result Date: 11/17/2019 3 views of the right foot shows no acute findings.  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    PMFS History: Patient Active Problem List   Diagnosis Date Noted  . Arthritis of carpometacarpal [facility]) joint of left thumb 03/05/2018  . De Quervain's tenosynovitis, right 03/05/2018  . Malignant neoplasm of upper-inner quadrant of right breast in female, estrogen receptor positive ([place]) 10/24/2017  . Overweight (BMI 25.0-29.9) 04/07/2017  . History of cancer of right breast 04/07/2017  . History of DVT (deep vein thrombosis) 04/07/2017  . GERD (gastroesophageal reflux disease) 11/01/2016  . Primary insomnia 11/01/2016  . Urge incontinence 11/01/2016  . History of colon polyps 11/01/2016  . Postnasal drip 11/01/2016  . Shingles 06/27/2016   Past Medical History:  Diagnosis Date  . Anxiety   . Breast cancer ([place]) 06/27/2012   Right  . Cataract   . DVT (deep venous thrombosis) ([place]) 06/28/2015  . Dyslipidemia   . GERD (gastroesophageal reflux disease)   . Insomnia   . Osteopenia   . Overactive bladder   . Personal history of radiation therapy   . Shingles 06/2016  . Thyroid disease     Family History  Problem Relation Age of Onset  . Lung cancer Mother   . Colon cancer Father     Past Surgical History:  Procedure Laterality Date  . BREAST LUMPECTOMY Right 2014  . BREAST LUMPECTOMY  2013   right  . CATARACT EXTRACTION W/ INTRAOCULAR LENS IMPLANT Bilateral   . COLONOSCOPY  08/27/2012   [place]  . KNEE ARTHROSCOPY     left  . LASIK Bilateral   . REDUCTION MAMMAPLASTY Bilateral 1985   anchor scar   . VAGINAL HYSTERECTOMY  2002   Social History   Occupational History  . Not on file  Tobacco Use  . Smoking status: Former Smoker    Packs/day: 1.00    Years: 10.00    Pack years: 10.00     Types: Cigarettes  . Smokeless tobacco: Never Used  Substance and Sexual Activity  . Alcohol use: Yes    Comment: 1-2 drinks per day  . Drug use: No  . Sexual activity: Not Currently

## 2019-11-23 DIAGNOSIS — H52203 Unspecified astigmatism, bilateral: Secondary | ICD-10-CM | POA: Diagnosis not present

## 2019-11-23 DIAGNOSIS — Z961 Presence of intraocular lens: Secondary | ICD-10-CM | POA: Diagnosis not present

## 2019-11-23 DIAGNOSIS — H0012 Chalazion right lower eyelid: Secondary | ICD-10-CM | POA: Diagnosis not present

## 2019-12-18 DIAGNOSIS — M25552 Pain in left hip: Secondary | ICD-10-CM | POA: Diagnosis not present

## 2020-02-13 DIAGNOSIS — L03311 Cellulitis of abdominal wall: Secondary | ICD-10-CM | POA: Diagnosis not present

## 2020-02-13 DIAGNOSIS — R222 Localized swelling, mass and lump, trunk: Secondary | ICD-10-CM | POA: Diagnosis not present

## 2020-03-30 ENCOUNTER — Ambulatory Visit: Payer: Medicare Other | Admitting: Podiatry

## 2020-04-05 DIAGNOSIS — S0990XA Unspecified injury of head, initial encounter: Secondary | ICD-10-CM | POA: Diagnosis not present

## 2020-04-05 DIAGNOSIS — S0191XA Laceration without foreign body of unspecified part of head, initial encounter: Secondary | ICD-10-CM | POA: Diagnosis not present

## 2020-04-05 DIAGNOSIS — W2113XA Struck by golf club, initial encounter: Secondary | ICD-10-CM | POA: Diagnosis not present

## 2020-04-05 DIAGNOSIS — S0101XA Laceration without foreign body of scalp, initial encounter: Secondary | ICD-10-CM | POA: Diagnosis not present

## 2020-04-05 DIAGNOSIS — T07XXXA Unspecified multiple injuries, initial encounter: Secondary | ICD-10-CM | POA: Diagnosis not present

## 2020-04-14 DIAGNOSIS — E785 Hyperlipidemia, unspecified: Secondary | ICD-10-CM | POA: Diagnosis not present

## 2020-04-14 DIAGNOSIS — M859 Disorder of bone density and structure, unspecified: Secondary | ICD-10-CM | POA: Diagnosis not present

## 2020-04-14 DIAGNOSIS — M8580 Other specified disorders of bone density and structure, unspecified site: Secondary | ICD-10-CM | POA: Diagnosis not present

## 2020-04-14 DIAGNOSIS — R739 Hyperglycemia, unspecified: Secondary | ICD-10-CM | POA: Diagnosis not present

## 2020-04-15 ENCOUNTER — Ambulatory Visit (INDEPENDENT_AMBULATORY_CARE_PROVIDER_SITE_OTHER): Payer: Medicare Other

## 2020-04-15 ENCOUNTER — Ambulatory Visit (INDEPENDENT_AMBULATORY_CARE_PROVIDER_SITE_OTHER): Payer: Medicare Other | Admitting: Podiatry

## 2020-04-15 ENCOUNTER — Encounter: Payer: Self-pay | Admitting: Podiatry

## 2020-04-15 ENCOUNTER — Other Ambulatory Visit: Payer: Self-pay

## 2020-04-15 DIAGNOSIS — M76811 Anterior tibial syndrome, right leg: Secondary | ICD-10-CM

## 2020-04-15 DIAGNOSIS — M79674 Pain in right toe(s): Secondary | ICD-10-CM | POA: Diagnosis not present

## 2020-04-15 DIAGNOSIS — L600 Ingrowing nail: Secondary | ICD-10-CM | POA: Diagnosis not present

## 2020-04-20 NOTE — Progress Notes (Signed)
Subjective:   Patient ID: Jillian Norton, female   DOB: 79 y.o.   MRN: [id number]   HPI Patient presents with pain on the inside of the right ankle acute nature of approximate 1 month duration and also has chronic ingrown toenail that is been sore and she is concerned about   ROS      Objective:  Physical Exam  Neurovascular status intact with pain at the base of the first metatarsocuneiform with no indications currently of muscle strength loss anterior tib and moderate depression of the arch.  Patient also has an incurvated right hallux that is sore in the corner with no active redness or drainage noted     Assessment:  Anterior tibial tendinitis right with inflammation pain and also ingrown toenail deformity     Plan:  H&P reviewed both case conditions separately.  For the ingrown I have recommended soaks and gentle trimming which I did today and possibility for permanent correction which I educated her on today.  I went ahead today and I did a sterile prep and injected the anterior tibial insertion 3 mg Dexasone Kenalog 5 mg Xylocaine and applied fascial brace to lift up the arch and gave instructions for supportive shoes and reappoint to recheck for the next several weeks  X-rays were negative for signs of fracture associated with this with moderate depression of the arch

## 2020-04-21 DIAGNOSIS — M8580 Other specified disorders of bone density and structure, unspecified site: Secondary | ICD-10-CM | POA: Diagnosis not present

## 2020-04-21 DIAGNOSIS — M1990 Unspecified osteoarthritis, unspecified site: Secondary | ICD-10-CM | POA: Diagnosis not present

## 2020-04-21 DIAGNOSIS — D72819 Decreased white blood cell count, unspecified: Secondary | ICD-10-CM | POA: Diagnosis not present

## 2020-04-21 DIAGNOSIS — R202 Paresthesia of skin: Secondary | ICD-10-CM | POA: Diagnosis not present

## 2020-04-21 DIAGNOSIS — Z1212 Encounter for screening for malignant neoplasm of rectum: Secondary | ICD-10-CM | POA: Diagnosis not present

## 2020-04-21 DIAGNOSIS — G4700 Insomnia, unspecified: Secondary | ICD-10-CM | POA: Diagnosis not present

## 2020-04-21 DIAGNOSIS — R82998 Other abnormal findings in urine: Secondary | ICD-10-CM | POA: Diagnosis not present

## 2020-04-21 DIAGNOSIS — R5383 Other fatigue: Secondary | ICD-10-CM | POA: Diagnosis not present

## 2020-04-21 DIAGNOSIS — E785 Hyperlipidemia, unspecified: Secondary | ICD-10-CM | POA: Diagnosis not present

## 2020-04-21 DIAGNOSIS — Z86718 Personal history of other venous thrombosis and embolism: Secondary | ICD-10-CM | POA: Diagnosis not present

## 2020-04-21 DIAGNOSIS — F325 Major depressive disorder, single episode, in full remission: Secondary | ICD-10-CM | POA: Diagnosis not present

## 2020-04-21 DIAGNOSIS — C50919 Malignant neoplasm of unspecified site of unspecified female breast: Secondary | ICD-10-CM | POA: Diagnosis not present

## 2020-04-21 DIAGNOSIS — Z853 Personal history of malignant neoplasm of breast: Secondary | ICD-10-CM | POA: Diagnosis not present

## 2020-04-21 DIAGNOSIS — F418 Other specified anxiety disorders: Secondary | ICD-10-CM | POA: Diagnosis not present

## 2020-04-21 DIAGNOSIS — T63444S Toxic effect of venom of bees, undetermined, sequela: Secondary | ICD-10-CM | POA: Diagnosis not present

## 2020-04-21 DIAGNOSIS — R739 Hyperglycemia, unspecified: Secondary | ICD-10-CM | POA: Diagnosis not present

## 2020-04-21 DIAGNOSIS — Z0000 Encounter for general adult medical examination without abnormal findings: Secondary | ICD-10-CM | POA: Diagnosis not present

## 2020-04-22 DIAGNOSIS — Z85828 Personal history of other malignant neoplasm of skin: Secondary | ICD-10-CM | POA: Diagnosis not present

## 2020-04-22 DIAGNOSIS — D225 Melanocytic nevi of trunk: Secondary | ICD-10-CM | POA: Diagnosis not present

## 2020-04-22 DIAGNOSIS — L905 Scar conditions and fibrosis of skin: Secondary | ICD-10-CM | POA: Diagnosis not present

## 2020-04-22 DIAGNOSIS — Z85820 Personal history of malignant melanoma of skin: Secondary | ICD-10-CM | POA: Diagnosis not present

## 2020-04-22 DIAGNOSIS — L570 Actinic keratosis: Secondary | ICD-10-CM | POA: Diagnosis not present

## 2020-04-22 DIAGNOSIS — L814 Other melanin hyperpigmentation: Secondary | ICD-10-CM | POA: Diagnosis not present

## 2020-04-22 DIAGNOSIS — L821 Other seborrheic keratosis: Secondary | ICD-10-CM | POA: Diagnosis not present

## 2020-04-22 DIAGNOSIS — D1801 Hemangioma of skin and subcutaneous tissue: Secondary | ICD-10-CM | POA: Diagnosis not present

## 2020-04-25 DIAGNOSIS — Z23 Encounter for immunization: Secondary | ICD-10-CM | POA: Diagnosis not present

## 2020-05-09 ENCOUNTER — Encounter: Payer: Self-pay | Admitting: Podiatry

## 2020-05-09 ENCOUNTER — Other Ambulatory Visit: Payer: Self-pay

## 2020-05-09 ENCOUNTER — Ambulatory Visit (INDEPENDENT_AMBULATORY_CARE_PROVIDER_SITE_OTHER): Payer: Medicare Other | Admitting: Podiatry

## 2020-05-09 DIAGNOSIS — L600 Ingrowing nail: Secondary | ICD-10-CM

## 2020-05-09 DIAGNOSIS — M76811 Anterior tibial syndrome, right leg: Secondary | ICD-10-CM | POA: Diagnosis not present

## 2020-05-09 NOTE — Patient Instructions (Signed)

## 2020-05-10 DIAGNOSIS — H01001 Unspecified blepharitis right upper eyelid: Secondary | ICD-10-CM | POA: Diagnosis not present

## 2020-05-11 ENCOUNTER — Encounter: Payer: Self-pay | Admitting: Podiatry

## 2020-05-11 ENCOUNTER — Ambulatory Visit (INDEPENDENT_AMBULATORY_CARE_PROVIDER_SITE_OTHER): Payer: Medicare Other | Admitting: Podiatry

## 2020-05-11 ENCOUNTER — Other Ambulatory Visit: Payer: Self-pay

## 2020-05-11 DIAGNOSIS — L03032 Cellulitis of left toe: Secondary | ICD-10-CM | POA: Diagnosis not present

## 2020-05-11 MED ORDER — AZITHROMYCIN 250 MG PO TABS: ORAL_TABLET | ORAL | 0 refills | Status: DC

## 2020-05-11 NOTE — Progress Notes (Signed)
Subjective:   Patient ID: Jillian Norton, female   DOB: 79 y.o.   MRN: [id number]   HPI Patient states that the tendon seems to be improving and while still sore nowhere near to the degree and is wearing supportive shoes.  Complains about her right big toenail being incurvated and states that it sore on the border and making it hard to wear shoe gear as she thinks she rather go ahead and have it fixed for the long-term   ROS      Objective:  Physical Exam  Neurovascular status intact with patient's anterior tibial insertion into the base of the first metatarsal healing well with minimal inflammation currently and strength of the tendon noted to be normal.  Patient does have an incurvated medial border right hallux that is painful and has been giving her problems     Assessment:  Anterior tibial tendinitis right improving but still present with ingrown toenail deformity right hallux     Plan:  Reviewed both conditions separately and for the anterior tibial tendinitis I have recommended continued support topical medications ice therapy and stretching exercises.  Educated her on other possible treatments if symptoms persist and today we discussed the ingrown toenail and she wants this fixed.  I recommended permanent type procedure explaining the surgery to her and at this time I went ahead and I allowed her to sign consent form.  I then infiltrated the right hallux 60 mg Xylocaine Marcaine mixture sterile prep done and using sterile instrumentation remove the medial border exposed matrix and applied phenol 3 applications 30 seconds followed by alcohol lavage and sterile dressing.  Gave instructions on soaks and to leave dressing on 24 hours but take it off earlier if any throbbing were to occur and encouraged her to call with questions concerns which may arise

## 2020-05-11 NOTE — Progress Notes (Signed)
Subjective:   Patient ID: Jillian Norton, female   DOB: 79 y.o.   MRN: [id number]   HPI Patient presents stating the right hallux was starting to get more red and I am concerned I may have an infection states it is been sore   ROS      Objective:  Physical Exam  Neurovascular status intact negative Bevelyn Buckles' sign noted with patient shown to have erythema in the proximal nail fold of the right hallux medial side that does extend slightly proximal.  It is localized and does not go past the inner phalangeal joint moderately tender     Assessment:  Possibility for paronychia infection right hallux     Plan:  Reviewed possibility of infection discussed different treatment options including bandaging techniques and soaks and is precautionary measure placed on a Z-Pak with instructions that if any further redness drainage swelling were to occur to let us know immediately

## 2020-05-17 DIAGNOSIS — Z23 Encounter for immunization: Secondary | ICD-10-CM | POA: Diagnosis not present

## 2020-06-22 DIAGNOSIS — R635 Abnormal weight gain: Secondary | ICD-10-CM | POA: Diagnosis not present

## 2020-06-22 DIAGNOSIS — N951 Menopausal and female climacteric states: Secondary | ICD-10-CM | POA: Diagnosis not present

## 2020-06-22 DIAGNOSIS — Z6828 Body mass index (BMI) 28.0-28.9, adult: Secondary | ICD-10-CM | POA: Diagnosis not present

## 2020-06-27 DIAGNOSIS — N951 Menopausal and female climacteric states: Secondary | ICD-10-CM | POA: Diagnosis not present

## 2020-06-27 DIAGNOSIS — Z6828 Body mass index (BMI) 28.0-28.9, adult: Secondary | ICD-10-CM | POA: Diagnosis not present

## 2020-06-27 DIAGNOSIS — Z1339 Encounter for screening examination for other mental health and behavioral disorders: Secondary | ICD-10-CM | POA: Diagnosis not present

## 2020-06-27 DIAGNOSIS — E663 Overweight: Secondary | ICD-10-CM | POA: Diagnosis not present

## 2020-06-27 DIAGNOSIS — R32 Unspecified urinary incontinence: Secondary | ICD-10-CM | POA: Diagnosis not present

## 2020-06-27 DIAGNOSIS — Z789 Other specified health status: Secondary | ICD-10-CM | POA: Diagnosis not present

## 2020-06-27 DIAGNOSIS — Z1331 Encounter for screening for depression: Secondary | ICD-10-CM | POA: Diagnosis not present

## 2020-06-27 DIAGNOSIS — R635 Abnormal weight gain: Secondary | ICD-10-CM | POA: Diagnosis not present

## 2020-07-04 DIAGNOSIS — Z6828 Body mass index (BMI) 28.0-28.9, adult: Secondary | ICD-10-CM | POA: Diagnosis not present

## 2020-07-04 DIAGNOSIS — N951 Menopausal and female climacteric states: Secondary | ICD-10-CM | POA: Diagnosis not present

## 2020-07-04 DIAGNOSIS — Z789 Other specified health status: Secondary | ICD-10-CM | POA: Diagnosis not present

## 2020-07-05 ENCOUNTER — Other Ambulatory Visit: Payer: Self-pay | Admitting: Internal Medicine

## 2020-07-05 DIAGNOSIS — N644 Mastodynia: Secondary | ICD-10-CM

## 2020-07-11 DIAGNOSIS — Z6827 Body mass index (BMI) 27.0-27.9, adult: Secondary | ICD-10-CM | POA: Diagnosis not present

## 2020-07-11 DIAGNOSIS — Z789 Other specified health status: Secondary | ICD-10-CM | POA: Diagnosis not present

## 2020-07-14 ENCOUNTER — Other Ambulatory Visit: Payer: Self-pay | Admitting: Internal Medicine

## 2020-07-18 DIAGNOSIS — Z6827 Body mass index (BMI) 27.0-27.9, adult: Secondary | ICD-10-CM | POA: Diagnosis not present

## 2020-07-18 DIAGNOSIS — Z789 Other specified health status: Secondary | ICD-10-CM | POA: Diagnosis not present

## 2020-08-08 DIAGNOSIS — N951 Menopausal and female climacteric states: Secondary | ICD-10-CM | POA: Diagnosis not present

## 2020-08-11 DIAGNOSIS — N951 Menopausal and female climacteric states: Secondary | ICD-10-CM | POA: Diagnosis not present

## 2020-08-11 DIAGNOSIS — R32 Unspecified urinary incontinence: Secondary | ICD-10-CM | POA: Diagnosis not present

## 2020-08-12 ENCOUNTER — Other Ambulatory Visit: Payer: Medicare Other

## 2020-08-30 DIAGNOSIS — Z789 Other specified health status: Secondary | ICD-10-CM | POA: Diagnosis not present

## 2020-08-30 DIAGNOSIS — N951 Menopausal and female climacteric states: Secondary | ICD-10-CM | POA: Diagnosis not present

## 2020-09-06 DIAGNOSIS — Z789 Other specified health status: Secondary | ICD-10-CM | POA: Diagnosis not present

## 2020-09-06 DIAGNOSIS — Z6827 Body mass index (BMI) 27.0-27.9, adult: Secondary | ICD-10-CM | POA: Diagnosis not present

## 2020-09-06 DIAGNOSIS — N951 Menopausal and female climacteric states: Secondary | ICD-10-CM | POA: Diagnosis not present

## 2020-09-08 DIAGNOSIS — Z23 Encounter for immunization: Secondary | ICD-10-CM | POA: Diagnosis not present

## 2020-09-16 DIAGNOSIS — K219 Gastro-esophageal reflux disease without esophagitis: Secondary | ICD-10-CM | POA: Diagnosis not present

## 2020-09-16 DIAGNOSIS — Z6827 Body mass index (BMI) 27.0-27.9, adult: Secondary | ICD-10-CM | POA: Diagnosis not present

## 2020-09-16 DIAGNOSIS — E663 Overweight: Secondary | ICD-10-CM | POA: Diagnosis not present

## 2020-09-16 DIAGNOSIS — E559 Vitamin D deficiency, unspecified: Secondary | ICD-10-CM | POA: Diagnosis not present

## 2020-09-16 DIAGNOSIS — E782 Mixed hyperlipidemia: Secondary | ICD-10-CM | POA: Diagnosis not present

## 2020-09-16 DIAGNOSIS — Z853 Personal history of malignant neoplasm of breast: Secondary | ICD-10-CM | POA: Diagnosis not present

## 2020-09-21 ENCOUNTER — Other Ambulatory Visit: Payer: Medicare Other

## 2020-09-29 DIAGNOSIS — Z6827 Body mass index (BMI) 27.0-27.9, adult: Secondary | ICD-10-CM | POA: Diagnosis not present

## 2020-09-29 DIAGNOSIS — E782 Mixed hyperlipidemia: Secondary | ICD-10-CM | POA: Diagnosis not present

## 2020-10-03 ENCOUNTER — Ambulatory Visit (INDEPENDENT_AMBULATORY_CARE_PROVIDER_SITE_OTHER): Payer: Medicare Other

## 2020-10-03 ENCOUNTER — Other Ambulatory Visit: Payer: Self-pay

## 2020-10-03 ENCOUNTER — Ambulatory Visit (INDEPENDENT_AMBULATORY_CARE_PROVIDER_SITE_OTHER): Payer: Medicare Other | Admitting: Podiatry

## 2020-10-03 DIAGNOSIS — M2041 Other hammer toe(s) (acquired), right foot: Secondary | ICD-10-CM

## 2020-10-03 DIAGNOSIS — M25571 Pain in right ankle and joints of right foot: Secondary | ICD-10-CM

## 2020-10-03 DIAGNOSIS — M779 Enthesopathy, unspecified: Secondary | ICD-10-CM

## 2020-10-03 MED ORDER — TRIAMCINOLONE ACETONIDE 10 MG/ML IJ SUSP
10.0000 mg | Freq: Once | INTRAMUSCULAR | Status: AC
  Administered 2020-10-03: 10 mg

## 2020-10-04 NOTE — Progress Notes (Signed)
Subjective:   Patient ID: Jillian Norton, female   DOB: 80 y.o.   MRN: [id number]   HPI Patient presents stating she is developed pain on top of her right ankle and may have had an injury and also is complaining about problems with the fourth toe of the right with a plantar declination of the digit.  States that she is limping currently   ROS      Objective:  Physical Exam  Neurovascular status intact negative Bevelyn Buckles' sign noted patient is right inflammation of the ankle joint medial side no indications of excessive swelling or crepitus when I move the joint or restricted motion and also appears to have a digital deformity fourth right with plantar flexion of the digit note     Assessment:  Inflammatory ankle capsulitis right with fluid buildup along with digital deformity fourth right with lowering of the toe     Plan:  H&P reviewed conditions and recommended buttress padding for the digit discussing utilization and dispensing today.  Explained hammertoe then reviewed x-rays and injected the ankle joint 3 mg Kenalog 5 mg Xylocaine and reappoint to recheck  X-rays indicate the joint looks good there is no signs of calcification or pathology associated with it with moderate declination fourth toe

## 2020-10-21 DIAGNOSIS — Z85828 Personal history of other malignant neoplasm of skin: Secondary | ICD-10-CM | POA: Diagnosis not present

## 2020-10-21 DIAGNOSIS — D2371 Other benign neoplasm of skin of right lower limb, including hip: Secondary | ICD-10-CM | POA: Diagnosis not present

## 2020-10-21 DIAGNOSIS — E559 Vitamin D deficiency, unspecified: Secondary | ICD-10-CM | POA: Diagnosis not present

## 2020-10-21 DIAGNOSIS — Z6827 Body mass index (BMI) 27.0-27.9, adult: Secondary | ICD-10-CM | POA: Diagnosis not present

## 2020-10-21 DIAGNOSIS — L300 Nummular dermatitis: Secondary | ICD-10-CM | POA: Diagnosis not present

## 2020-10-21 DIAGNOSIS — L814 Other melanin hyperpigmentation: Secondary | ICD-10-CM | POA: Diagnosis not present

## 2020-10-21 DIAGNOSIS — D1801 Hemangioma of skin and subcutaneous tissue: Secondary | ICD-10-CM | POA: Diagnosis not present

## 2020-10-21 DIAGNOSIS — L821 Other seborrheic keratosis: Secondary | ICD-10-CM | POA: Diagnosis not present

## 2020-10-21 DIAGNOSIS — Z85820 Personal history of malignant melanoma of skin: Secondary | ICD-10-CM | POA: Diagnosis not present

## 2020-10-21 DIAGNOSIS — D225 Melanocytic nevi of trunk: Secondary | ICD-10-CM | POA: Diagnosis not present

## 2020-10-31 ENCOUNTER — Ambulatory Visit: Payer: Medicare Other

## 2020-10-31 ENCOUNTER — Other Ambulatory Visit: Payer: Self-pay

## 2020-10-31 ENCOUNTER — Ambulatory Visit
Admission: RE | Admit: 2020-10-31 | Discharge: 2020-10-31 | Disposition: A | Discharge status: Home / Self Care | Payer: Medicare Other | Source: Ambulatory Visit | Attending: Internal Medicine | Admitting: Internal Medicine

## 2020-10-31 DIAGNOSIS — N644 Mastodynia: Secondary | ICD-10-CM

## 2020-10-31 DIAGNOSIS — Z853 Personal history of malignant neoplasm of breast: Secondary | ICD-10-CM | POA: Diagnosis not present

## 2020-11-02 DIAGNOSIS — M67873 Other specified disorders of tendon, right ankle and foot: Secondary | ICD-10-CM | POA: Diagnosis not present

## 2020-11-02 DIAGNOSIS — M25571 Pain in right ankle and joints of right foot: Secondary | ICD-10-CM | POA: Diagnosis not present

## 2020-11-02 DIAGNOSIS — M79671 Pain in right foot: Secondary | ICD-10-CM | POA: Diagnosis not present

## 2020-11-25 DIAGNOSIS — M79671 Pain in right foot: Secondary | ICD-10-CM | POA: Diagnosis not present

## 2020-11-25 DIAGNOSIS — M25571 Pain in right ankle and joints of right foot: Secondary | ICD-10-CM | POA: Diagnosis not present

## 2020-12-07 DIAGNOSIS — H52203 Unspecified astigmatism, bilateral: Secondary | ICD-10-CM | POA: Diagnosis not present

## 2020-12-07 DIAGNOSIS — Z961 Presence of intraocular lens: Secondary | ICD-10-CM | POA: Diagnosis not present

## 2020-12-07 DIAGNOSIS — N951 Menopausal and female climacteric states: Secondary | ICD-10-CM | POA: Diagnosis not present

## 2020-12-09 DIAGNOSIS — N951 Menopausal and female climacteric states: Secondary | ICD-10-CM | POA: Diagnosis not present

## 2020-12-09 DIAGNOSIS — R32 Unspecified urinary incontinence: Secondary | ICD-10-CM | POA: Diagnosis not present

## 2020-12-09 DIAGNOSIS — Z6828 Body mass index (BMI) 28.0-28.9, adult: Secondary | ICD-10-CM | POA: Diagnosis not present

## 2020-12-14 DIAGNOSIS — M19071 Primary osteoarthritis, right ankle and foot: Secondary | ICD-10-CM | POA: Diagnosis not present

## 2021-02-01 DIAGNOSIS — Z85828 Personal history of other malignant neoplasm of skin: Secondary | ICD-10-CM | POA: Diagnosis not present

## 2021-02-01 DIAGNOSIS — L820 Inflamed seborrheic keratosis: Secondary | ICD-10-CM | POA: Diagnosis not present

## 2021-02-01 DIAGNOSIS — Z85820 Personal history of malignant melanoma of skin: Secondary | ICD-10-CM | POA: Diagnosis not present

## 2021-02-28 DIAGNOSIS — M25532 Pain in left wrist: Secondary | ICD-10-CM | POA: Diagnosis not present

## 2021-02-28 DIAGNOSIS — M67873 Other specified disorders of tendon, right ankle and foot: Secondary | ICD-10-CM | POA: Diagnosis not present

## 2021-03-16 DIAGNOSIS — N951 Menopausal and female climacteric states: Secondary | ICD-10-CM | POA: Diagnosis not present

## 2021-03-16 DIAGNOSIS — E559 Vitamin D deficiency, unspecified: Secondary | ICD-10-CM | POA: Diagnosis not present

## 2021-03-16 DIAGNOSIS — E782 Mixed hyperlipidemia: Secondary | ICD-10-CM | POA: Diagnosis not present

## 2021-03-16 DIAGNOSIS — N3001 Acute cystitis with hematuria: Secondary | ICD-10-CM | POA: Diagnosis not present

## 2021-03-20 DIAGNOSIS — E559 Vitamin D deficiency, unspecified: Secondary | ICD-10-CM | POA: Diagnosis not present

## 2021-03-20 DIAGNOSIS — Z6828 Body mass index (BMI) 28.0-28.9, adult: Secondary | ICD-10-CM | POA: Diagnosis not present

## 2021-03-20 DIAGNOSIS — R232 Flushing: Secondary | ICD-10-CM | POA: Diagnosis not present

## 2021-03-20 DIAGNOSIS — R454 Irritability and anger: Secondary | ICD-10-CM | POA: Diagnosis not present

## 2021-03-20 DIAGNOSIS — N951 Menopausal and female climacteric states: Secondary | ICD-10-CM | POA: Diagnosis not present

## 2021-04-13 ENCOUNTER — Other Ambulatory Visit (HOSPITAL_BASED_OUTPATIENT_CLINIC_OR_DEPARTMENT_OTHER): Payer: Self-pay

## 2021-04-13 MED ORDER — OXYBUTYNIN CHLORIDE ER 10 MG PO TB24: 10.0000 mg | ORAL_TABLET | ORAL | 2 refills | Status: DC

## 2021-04-13 MED ORDER — VALACYCLOVIR HCL 1 G PO TABS: ORAL_TABLET | ORAL | 0 refills | Status: DC

## 2021-04-13 MED ORDER — BUPROPION HCL ER (XL) 150 MG PO TB24: 150.0000 mg | ORAL_TABLET | Freq: Every morning | ORAL | 0 refills | Status: DC

## 2021-04-13 MED ORDER — OMEPRAZOLE 40 MG PO CPDR
40.0000 mg | DELAYED_RELEASE_CAPSULE | Freq: Every day | ORAL | 2 refills | Status: DC
  Filled 2021-05-08: qty 90, 90d supply, fill #0
  Filled 2021-07-24 – 2021-07-28 (×2): qty 90, 90d supply, fill #1
  Filled 2021-11-13: qty 90, 90d supply, fill #2

## 2021-04-13 MED ORDER — OMEPRAZOLE 40 MG PO CPDR: 40.0000 mg | DELAYED_RELEASE_CAPSULE | Freq: Every day | ORAL | 2 refills | Status: DC

## 2021-04-13 MED ORDER — BUPROPION HCL ER (XL) 150 MG PO TB24
150.0000 mg | ORAL_TABLET | Freq: Every morning | ORAL | 2 refills | Status: DC
  Filled 2021-05-02: qty 90, 90d supply, fill #0
  Filled 2021-07-24: qty 90, 90d supply, fill #1
  Filled 2021-10-16: qty 90, 90d supply, fill #2

## 2021-04-19 DIAGNOSIS — M25532 Pain in left wrist: Secondary | ICD-10-CM | POA: Diagnosis not present

## 2021-04-19 DIAGNOSIS — M79671 Pain in right foot: Secondary | ICD-10-CM | POA: Diagnosis not present

## 2021-04-19 DIAGNOSIS — M19071 Primary osteoarthritis, right ankle and foot: Secondary | ICD-10-CM | POA: Diagnosis not present

## 2021-04-21 DIAGNOSIS — E785 Hyperlipidemia, unspecified: Secondary | ICD-10-CM | POA: Diagnosis not present

## 2021-04-21 DIAGNOSIS — M859 Disorder of bone density and structure, unspecified: Secondary | ICD-10-CM | POA: Diagnosis not present

## 2021-04-21 DIAGNOSIS — R739 Hyperglycemia, unspecified: Secondary | ICD-10-CM | POA: Diagnosis not present

## 2021-04-26 DIAGNOSIS — Z85820 Personal history of malignant melanoma of skin: Secondary | ICD-10-CM | POA: Diagnosis not present

## 2021-04-26 DIAGNOSIS — Z85828 Personal history of other malignant neoplasm of skin: Secondary | ICD-10-CM | POA: Diagnosis not present

## 2021-04-26 DIAGNOSIS — L720 Epidermal cyst: Secondary | ICD-10-CM | POA: Diagnosis not present

## 2021-04-28 ENCOUNTER — Other Ambulatory Visit (HOSPITAL_BASED_OUTPATIENT_CLINIC_OR_DEPARTMENT_OTHER): Payer: Self-pay

## 2021-04-28 DIAGNOSIS — R82998 Other abnormal findings in urine: Secondary | ICD-10-CM | POA: Diagnosis not present

## 2021-04-28 DIAGNOSIS — E785 Hyperlipidemia, unspecified: Secondary | ICD-10-CM | POA: Diagnosis not present

## 2021-04-28 MED ORDER — BUPROPION HCL ER (XL) 300 MG PO TB24
ORAL_TABLET | ORAL | 3 refills | Status: DC
  Filled 2021-04-28: qty 90, 90d supply, fill #0

## 2021-04-28 MED ORDER — EPINEPHRINE 0.3 MG/0.3ML IJ SOAJ
INTRAMUSCULAR | 0 refills | Status: DC
  Filled 2021-04-28: qty 2, 30d supply, fill #0

## 2021-05-02 ENCOUNTER — Other Ambulatory Visit (HOSPITAL_BASED_OUTPATIENT_CLINIC_OR_DEPARTMENT_OTHER): Payer: Self-pay

## 2021-05-08 ENCOUNTER — Other Ambulatory Visit (HOSPITAL_BASED_OUTPATIENT_CLINIC_OR_DEPARTMENT_OTHER): Payer: Self-pay

## 2021-05-08 DIAGNOSIS — M8589 Other specified disorders of bone density and structure, multiple sites: Secondary | ICD-10-CM | POA: Diagnosis not present

## 2021-05-23 ENCOUNTER — Other Ambulatory Visit (HOSPITAL_BASED_OUTPATIENT_CLINIC_OR_DEPARTMENT_OTHER): Payer: Self-pay

## 2021-05-23 DIAGNOSIS — M25532 Pain in left wrist: Secondary | ICD-10-CM | POA: Diagnosis not present

## 2021-05-23 DIAGNOSIS — M79671 Pain in right foot: Secondary | ICD-10-CM | POA: Diagnosis not present

## 2021-05-23 DIAGNOSIS — M19071 Primary osteoarthritis, right ankle and foot: Secondary | ICD-10-CM | POA: Diagnosis not present

## 2021-05-23 MED ORDER — PREDNISONE 10 MG PO TABS
ORAL_TABLET | ORAL | 0 refills | Status: DC
  Filled 2021-05-23: qty 20, 8d supply, fill #0

## 2021-06-12 ENCOUNTER — Other Ambulatory Visit: Payer: Self-pay

## 2021-06-12 ENCOUNTER — Other Ambulatory Visit (HOSPITAL_BASED_OUTPATIENT_CLINIC_OR_DEPARTMENT_OTHER): Payer: Self-pay

## 2021-06-12 ENCOUNTER — Ambulatory Visit: Payer: Medicare Other | Attending: Internal Medicine

## 2021-06-12 DIAGNOSIS — Z23 Encounter for immunization: Secondary | ICD-10-CM

## 2021-06-12 MED ORDER — MODERNA COVID-19 BIVAL BOOSTER 50 MCG/0.5ML IM SUSP
INTRAMUSCULAR | 0 refills | Status: DC
  Filled 2021-06-12: qty 0.5, 1d supply, fill #0

## 2021-06-12 MED ORDER — INFLUENZA VAC A&B SA ADJ QUAD 0.5 ML IM PRSY
PREFILLED_SYRINGE | INTRAMUSCULAR | 0 refills | Status: DC
  Filled 2021-06-12: qty 0.5, 1d supply, fill #0

## 2021-06-12 NOTE — Progress Notes (Signed)
   Covid-19 Vaccination Clinic  Name:  Jillian Norton    MRN: [id number] DOB: 05/03/1941  06/12/2021  [name] was observed post Covid-19 immunization for 15 minutes without incident. She was provided with Vaccine Information Sheet and instruction to access the V-Safe system.   [name] was instructed to call 911 with any severe reactions post vaccine: Difficulty breathing  Swelling of face and throat  A fast heartbeat  A bad rash all over body  Dizziness and weakness

## 2021-06-13 DIAGNOSIS — Z23 Encounter for immunization: Secondary | ICD-10-CM | POA: Diagnosis not present

## 2021-06-13 DIAGNOSIS — M25532 Pain in left wrist: Secondary | ICD-10-CM | POA: Diagnosis not present

## 2021-06-27 ENCOUNTER — Other Ambulatory Visit (HOSPITAL_BASED_OUTPATIENT_CLINIC_OR_DEPARTMENT_OTHER): Payer: Self-pay

## 2021-07-10 ENCOUNTER — Other Ambulatory Visit (HOSPITAL_BASED_OUTPATIENT_CLINIC_OR_DEPARTMENT_OTHER): Payer: Self-pay

## 2021-07-10 MED ORDER — ZOLPIDEM TARTRATE 10 MG PO TABS
ORAL_TABLET | ORAL | 1 refills | Status: DC
  Filled 2021-07-10: qty 90, 90d supply, fill #0
  Filled 2021-12-28: qty 90, 90d supply, fill #1

## 2021-07-11 DIAGNOSIS — M25532 Pain in left wrist: Secondary | ICD-10-CM | POA: Diagnosis not present

## 2021-07-12 ENCOUNTER — Other Ambulatory Visit (HOSPITAL_BASED_OUTPATIENT_CLINIC_OR_DEPARTMENT_OTHER): Payer: Self-pay

## 2021-07-12 MED ORDER — BUSPIRONE HCL 5 MG PO TABS
ORAL_TABLET | ORAL | 3 refills | Status: DC
  Filled 2021-07-12: qty 60, 30d supply, fill #0

## 2021-07-17 ENCOUNTER — Other Ambulatory Visit (HOSPITAL_BASED_OUTPATIENT_CLINIC_OR_DEPARTMENT_OTHER): Payer: Self-pay

## 2021-07-24 ENCOUNTER — Other Ambulatory Visit (HOSPITAL_BASED_OUTPATIENT_CLINIC_OR_DEPARTMENT_OTHER): Payer: Self-pay

## 2021-07-24 DIAGNOSIS — S76311A Strain of muscle, fascia and tendon of the posterior muscle group at thigh level, right thigh, initial encounter: Secondary | ICD-10-CM | POA: Diagnosis not present

## 2021-07-24 DIAGNOSIS — M1611 Unilateral primary osteoarthritis, right hip: Secondary | ICD-10-CM | POA: Diagnosis not present

## 2021-07-24 MED ORDER — CELECOXIB 200 MG PO CAPS
ORAL_CAPSULE | ORAL | 1 refills | Status: DC
  Filled 2021-07-24: qty 30, 30d supply, fill #0
  Filled 2022-06-05: qty 30, 30d supply, fill #1

## 2021-07-28 ENCOUNTER — Other Ambulatory Visit (HOSPITAL_BASED_OUTPATIENT_CLINIC_OR_DEPARTMENT_OTHER): Payer: Self-pay

## 2021-08-03 ENCOUNTER — Other Ambulatory Visit (HOSPITAL_BASED_OUTPATIENT_CLINIC_OR_DEPARTMENT_OTHER): Payer: Self-pay

## 2021-08-03 DIAGNOSIS — M25511 Pain in right shoulder: Secondary | ICD-10-CM | POA: Diagnosis not present

## 2021-08-14 DIAGNOSIS — U071 COVID-19: Secondary | ICD-10-CM | POA: Diagnosis not present

## 2021-08-30 ENCOUNTER — Emergency Department (HOSPITAL_BASED_OUTPATIENT_CLINIC_OR_DEPARTMENT_OTHER): Payer: Medicare Other

## 2021-08-30 ENCOUNTER — Encounter (HOSPITAL_BASED_OUTPATIENT_CLINIC_OR_DEPARTMENT_OTHER): Payer: Self-pay

## 2021-08-30 ENCOUNTER — Other Ambulatory Visit: Payer: Self-pay

## 2021-08-30 ENCOUNTER — Emergency Department (HOSPITAL_BASED_OUTPATIENT_CLINIC_OR_DEPARTMENT_OTHER)
Admission: EM | Admit: 2021-08-30 | Discharge: 2021-08-30 | Disposition: A | Discharge status: Home / Self Care | Payer: Medicare Other | Attending: Emergency Medicine | Admitting: Emergency Medicine

## 2021-08-30 DIAGNOSIS — R059 Cough, unspecified: Secondary | ICD-10-CM | POA: Diagnosis not present

## 2021-08-30 DIAGNOSIS — J9811 Atelectasis: Secondary | ICD-10-CM | POA: Diagnosis not present

## 2021-08-30 DIAGNOSIS — Z5321 Procedure and treatment not carried out due to patient leaving prior to being seen by health care provider: Secondary | ICD-10-CM | POA: Diagnosis not present

## 2021-08-30 DIAGNOSIS — Z20822 Contact with and (suspected) exposure to covid-19: Secondary | ICD-10-CM | POA: Diagnosis not present

## 2021-08-30 DIAGNOSIS — R0602 Shortness of breath: Secondary | ICD-10-CM | POA: Insufficient documentation

## 2021-08-30 LAB — RESP PANEL BY RT-PCR (FLU A&B, COVID) ARPGX2
Influenza A by PCR: NEGATIVE
Influenza B by PCR: NEGATIVE
SARS Coronavirus 2 by RT PCR: NEGATIVE

## 2021-08-30 NOTE — ED Triage Notes (Addendum)
Pt states she was on a cruise over the holiday. Pt reports persistent cough x 10 days and last night pt was unable to sleep due to cough. Pt has some associated ShOB. Pt reports negative home COVID test today.

## 2021-08-31 ENCOUNTER — Other Ambulatory Visit (HOSPITAL_BASED_OUTPATIENT_CLINIC_OR_DEPARTMENT_OTHER): Payer: Self-pay

## 2021-08-31 DIAGNOSIS — Z1152 Encounter for screening for COVID-19: Secondary | ICD-10-CM | POA: Diagnosis not present

## 2021-08-31 DIAGNOSIS — R5383 Other fatigue: Secondary | ICD-10-CM | POA: Diagnosis not present

## 2021-08-31 DIAGNOSIS — J029 Acute pharyngitis, unspecified: Secondary | ICD-10-CM | POA: Diagnosis not present

## 2021-08-31 DIAGNOSIS — R0981 Nasal congestion: Secondary | ICD-10-CM | POA: Diagnosis not present

## 2021-08-31 DIAGNOSIS — R059 Cough, unspecified: Secondary | ICD-10-CM | POA: Diagnosis not present

## 2021-08-31 DIAGNOSIS — J40 Bronchitis, not specified as acute or chronic: Secondary | ICD-10-CM | POA: Diagnosis not present

## 2021-08-31 DIAGNOSIS — R0602 Shortness of breath: Secondary | ICD-10-CM | POA: Diagnosis not present

## 2021-08-31 MED ORDER — PREDNISONE 20 MG PO TABS
ORAL_TABLET | ORAL | 0 refills | Status: DC
  Filled 2021-08-31: qty 5, 5d supply, fill #0

## 2021-08-31 MED ORDER — CEFDINIR 300 MG PO CAPS
ORAL_CAPSULE | ORAL | 0 refills | Status: DC
  Filled 2021-08-31: qty 14, 7d supply, fill #0

## 2021-08-31 MED ORDER — HYDROCODONE BIT-HOMATROP MBR 5-1.5 MG/5ML PO SOLN
ORAL | 0 refills | Status: DC
  Filled 2021-08-31: qty 140, 7d supply, fill #0

## 2021-08-31 MED ORDER — ALBUTEROL SULFATE HFA 108 (90 BASE) MCG/ACT IN AERS
INHALATION_SPRAY | RESPIRATORY_TRACT | 0 refills | Status: AC
  Filled 2021-08-31: qty 18, 50d supply, fill #0

## 2021-09-18 ENCOUNTER — Other Ambulatory Visit: Payer: Self-pay | Admitting: Internal Medicine

## 2021-09-18 DIAGNOSIS — Z1231 Encounter for screening mammogram for malignant neoplasm of breast: Secondary | ICD-10-CM

## 2021-09-21 DIAGNOSIS — M25532 Pain in left wrist: Secondary | ICD-10-CM | POA: Diagnosis not present

## 2021-10-16 ENCOUNTER — Other Ambulatory Visit (HOSPITAL_BASED_OUTPATIENT_CLINIC_OR_DEPARTMENT_OTHER): Payer: Self-pay

## 2021-10-17 ENCOUNTER — Other Ambulatory Visit (HOSPITAL_BASED_OUTPATIENT_CLINIC_OR_DEPARTMENT_OTHER): Payer: Self-pay

## 2021-10-23 ENCOUNTER — Ambulatory Visit: Payer: Medicare Other | Admitting: Podiatry

## 2021-10-27 ENCOUNTER — Encounter: Payer: Self-pay | Admitting: Podiatry

## 2021-10-27 ENCOUNTER — Ambulatory Visit (INDEPENDENT_AMBULATORY_CARE_PROVIDER_SITE_OTHER): Payer: Medicare Other

## 2021-10-27 ENCOUNTER — Other Ambulatory Visit: Payer: Self-pay

## 2021-10-27 ENCOUNTER — Ambulatory Visit (INDEPENDENT_AMBULATORY_CARE_PROVIDER_SITE_OTHER): Payer: Medicare Other | Admitting: Podiatry

## 2021-10-27 DIAGNOSIS — M779 Enthesopathy, unspecified: Secondary | ICD-10-CM

## 2021-10-27 DIAGNOSIS — M7751 Other enthesopathy of right foot: Secondary | ICD-10-CM

## 2021-10-27 MED ORDER — TRIAMCINOLONE ACETONIDE 10 MG/ML IJ SUSP
20.0000 mg | Freq: Once | INTRAMUSCULAR | Status: AC
  Administered 2021-10-27: 20 mg

## 2021-10-29 NOTE — Progress Notes (Signed)
Subjective:  ? ?Patient ID: Jillian Norton, female   DOB: 81 y.o.   MRN: [id number]  ? ?HPI ?Patient presents with 2 different areas of distinct discomfort with one of them being in the subtalar joint right which has been somewhat chronic in nature and the other being on the medial side of the foot and the area of the posterior tibial tendon and slightly distal near the anterior tibial tendon insertion.  Moderate flatfoot deformity noted ? ? ?ROS ? ? ?   ?Objective:  ?Physical Exam  ?Inflammatory capsulitis of the sinus tarsi subtalar joint right secondary to foot structure along with inflammation of the posterior tibial anterior tibial insertion right with inflammation fluid buildup ? ?   ?Assessment:  ?Discussed the etiology of the 2 different areas of inflammation with sinus tarsi and tibial inflammation ? ?   ?Plan:  ?H&P reviewed conditions and did 2 separate injections with sterile prep of each area and did explain chances for pathology associated with tendon injection did sterile prep injected around the posterior tibial tendon sheath right 3 mg Dexasone Kenalog 5 mg Xylocaine and into the subtalar joint right with 3 mg Kenalog 5 mg Xylocaine.  Reappoint to recheck as needed ? ?X-rays indicate moderate depression of the arch no other pathology with mild arthritis ?   ? ? ?

## 2021-11-01 ENCOUNTER — Other Ambulatory Visit: Payer: Self-pay | Admitting: Internal Medicine

## 2021-11-01 ENCOUNTER — Ambulatory Visit
Admission: RE | Admit: 2021-11-01 | Discharge: 2021-11-01 | Disposition: A | Discharge status: Home / Self Care | Payer: Medicare Other | Source: Ambulatory Visit | Attending: Internal Medicine | Admitting: Internal Medicine

## 2021-11-01 DIAGNOSIS — R928 Other abnormal and inconclusive findings on diagnostic imaging of breast: Secondary | ICD-10-CM

## 2021-11-01 DIAGNOSIS — Z1231 Encounter for screening mammogram for malignant neoplasm of breast: Secondary | ICD-10-CM | POA: Diagnosis not present

## 2021-11-09 DIAGNOSIS — D225 Melanocytic nevi of trunk: Secondary | ICD-10-CM | POA: Diagnosis not present

## 2021-11-09 DIAGNOSIS — L821 Other seborrheic keratosis: Secondary | ICD-10-CM | POA: Diagnosis not present

## 2021-11-09 DIAGNOSIS — L814 Other melanin hyperpigmentation: Secondary | ICD-10-CM | POA: Diagnosis not present

## 2021-11-09 DIAGNOSIS — D692 Other nonthrombocytopenic purpura: Secondary | ICD-10-CM | POA: Diagnosis not present

## 2021-11-09 DIAGNOSIS — L570 Actinic keratosis: Secondary | ICD-10-CM | POA: Diagnosis not present

## 2021-11-09 DIAGNOSIS — D1801 Hemangioma of skin and subcutaneous tissue: Secondary | ICD-10-CM | POA: Diagnosis not present

## 2021-11-09 DIAGNOSIS — Z85820 Personal history of malignant melanoma of skin: Secondary | ICD-10-CM | POA: Diagnosis not present

## 2021-11-09 DIAGNOSIS — Z85828 Personal history of other malignant neoplasm of skin: Secondary | ICD-10-CM | POA: Diagnosis not present

## 2021-11-13 ENCOUNTER — Other Ambulatory Visit (HOSPITAL_BASED_OUTPATIENT_CLINIC_OR_DEPARTMENT_OTHER): Payer: Self-pay

## 2021-11-15 ENCOUNTER — Other Ambulatory Visit: Payer: Medicare Other

## 2021-12-28 ENCOUNTER — Other Ambulatory Visit (HOSPITAL_BASED_OUTPATIENT_CLINIC_OR_DEPARTMENT_OTHER): Payer: Self-pay

## 2022-01-01 ENCOUNTER — Ambulatory Visit: Payer: Medicare Other

## 2022-01-01 ENCOUNTER — Ambulatory Visit
Admission: RE | Admit: 2022-01-01 | Discharge: 2022-01-01 | Disposition: A | Discharge status: Home / Self Care | Payer: Medicare Other | Source: Ambulatory Visit | Attending: Internal Medicine | Admitting: Internal Medicine

## 2022-01-01 DIAGNOSIS — R928 Other abnormal and inconclusive findings on diagnostic imaging of breast: Secondary | ICD-10-CM | POA: Diagnosis not present

## 2022-01-01 DIAGNOSIS — Z853 Personal history of malignant neoplasm of breast: Secondary | ICD-10-CM | POA: Diagnosis not present

## 2022-01-01 DIAGNOSIS — Z961 Presence of intraocular lens: Secondary | ICD-10-CM | POA: Diagnosis not present

## 2022-01-01 DIAGNOSIS — H52203 Unspecified astigmatism, bilateral: Secondary | ICD-10-CM | POA: Diagnosis not present

## 2022-01-01 DIAGNOSIS — H353131 Nonexudative age-related macular degeneration, bilateral, early dry stage: Secondary | ICD-10-CM | POA: Diagnosis not present

## 2022-01-18 ENCOUNTER — Other Ambulatory Visit (HOSPITAL_BASED_OUTPATIENT_CLINIC_OR_DEPARTMENT_OTHER): Payer: Self-pay

## 2022-01-18 DIAGNOSIS — E663 Overweight: Secondary | ICD-10-CM | POA: Diagnosis not present

## 2022-01-18 DIAGNOSIS — E785 Hyperlipidemia, unspecified: Secondary | ICD-10-CM | POA: Diagnosis not present

## 2022-01-18 MED ORDER — WEGOVY 0.5 MG/0.5ML ~~LOC~~ SOAJ
SUBCUTANEOUS | 0 refills | Status: DC
  Filled 2022-02-09: qty 2, 28d supply, fill #0

## 2022-01-18 MED ORDER — WEGOVY 0.25 MG/0.5ML ~~LOC~~ SOAJ
SUBCUTANEOUS | 0 refills | Status: DC
  Filled 2022-01-18: qty 2, 28d supply, fill #0

## 2022-01-18 MED ORDER — WEGOVY 2.4 MG/0.75ML ~~LOC~~ SOAJ
SUBCUTANEOUS | 0 refills | Status: DC
  Filled 2022-05-07: qty 3, 28d supply, fill #0

## 2022-01-18 MED ORDER — WEGOVY 1 MG/0.5ML ~~LOC~~ SOAJ
SUBCUTANEOUS | 0 refills | Status: DC
  Filled 2022-03-15: qty 2, 28d supply, fill #0

## 2022-01-18 MED ORDER — WEGOVY 1.7 MG/0.75ML ~~LOC~~ SOAJ
SUBCUTANEOUS | 0 refills | Status: DC
  Filled 2022-04-12: qty 3, 28d supply, fill #0

## 2022-01-19 ENCOUNTER — Other Ambulatory Visit (HOSPITAL_BASED_OUTPATIENT_CLINIC_OR_DEPARTMENT_OTHER): Payer: Self-pay

## 2022-01-25 DIAGNOSIS — M79641 Pain in right hand: Secondary | ICD-10-CM | POA: Diagnosis not present

## 2022-02-09 ENCOUNTER — Other Ambulatory Visit (HOSPITAL_BASED_OUTPATIENT_CLINIC_OR_DEPARTMENT_OTHER): Payer: Self-pay

## 2022-02-12 ENCOUNTER — Other Ambulatory Visit (HOSPITAL_BASED_OUTPATIENT_CLINIC_OR_DEPARTMENT_OTHER): Payer: Self-pay

## 2022-02-13 ENCOUNTER — Other Ambulatory Visit (HOSPITAL_BASED_OUTPATIENT_CLINIC_OR_DEPARTMENT_OTHER): Payer: Self-pay

## 2022-02-13 DIAGNOSIS — N390 Urinary tract infection, site not specified: Secondary | ICD-10-CM | POA: Diagnosis not present

## 2022-02-13 MED ORDER — NITROFURANTOIN MONOHYD MACRO 100 MG PO CAPS
ORAL_CAPSULE | ORAL | 0 refills | Status: DC
  Filled 2022-02-13: qty 14, 7d supply, fill #0

## 2022-02-13 MED ORDER — NITROFURANTOIN MONOHYD MACRO 100 MG PO CAPS: ORAL_CAPSULE | ORAL | 0 refills | Status: DC

## 2022-02-16 ENCOUNTER — Other Ambulatory Visit (HOSPITAL_BASED_OUTPATIENT_CLINIC_OR_DEPARTMENT_OTHER): Payer: Self-pay

## 2022-02-16 MED ORDER — OMEPRAZOLE 40 MG PO CPDR
40.0000 mg | DELAYED_RELEASE_CAPSULE | Freq: Every day | ORAL | 2 refills | Status: DC
  Filled 2022-03-22: qty 90, 90d supply, fill #0
  Filled 2022-06-05 – 2022-06-11 (×2): qty 90, 90d supply, fill #1
  Filled 2022-09-14: qty 90, 90d supply, fill #2

## 2022-02-16 MED ORDER — BUPROPION HCL ER (XL) 150 MG PO TB24
150.0000 mg | ORAL_TABLET | Freq: Every morning | ORAL | 0 refills | Status: DC
  Filled 2022-02-16: qty 90, 90d supply, fill #0

## 2022-02-19 ENCOUNTER — Other Ambulatory Visit (HOSPITAL_BASED_OUTPATIENT_CLINIC_OR_DEPARTMENT_OTHER): Payer: Self-pay

## 2022-03-02 ENCOUNTER — Other Ambulatory Visit (HOSPITAL_BASED_OUTPATIENT_CLINIC_OR_DEPARTMENT_OTHER): Payer: Self-pay

## 2022-03-02 DIAGNOSIS — M549 Dorsalgia, unspecified: Secondary | ICD-10-CM | POA: Diagnosis not present

## 2022-03-02 DIAGNOSIS — E785 Hyperlipidemia, unspecified: Secondary | ICD-10-CM | POA: Diagnosis not present

## 2022-03-02 DIAGNOSIS — R739 Hyperglycemia, unspecified: Secondary | ICD-10-CM | POA: Diagnosis not present

## 2022-03-02 DIAGNOSIS — E663 Overweight: Secondary | ICD-10-CM | POA: Diagnosis not present

## 2022-03-02 DIAGNOSIS — G4700 Insomnia, unspecified: Secondary | ICD-10-CM | POA: Diagnosis not present

## 2022-03-02 DIAGNOSIS — M8580 Other specified disorders of bone density and structure, unspecified site: Secondary | ICD-10-CM | POA: Diagnosis not present

## 2022-03-02 MED ORDER — CYCLOBENZAPRINE HCL 10 MG PO TABS
ORAL_TABLET | ORAL | 0 refills | Status: DC
  Filled 2022-03-02: qty 20, 10d supply, fill #0

## 2022-03-02 MED ORDER — PREDNISONE 10 MG PO TABS
ORAL_TABLET | ORAL | 0 refills | Status: DC
  Filled 2022-03-02: qty 8, 5d supply, fill #0

## 2022-03-15 ENCOUNTER — Other Ambulatory Visit (HOSPITAL_BASED_OUTPATIENT_CLINIC_OR_DEPARTMENT_OTHER): Payer: Self-pay

## 2022-03-22 ENCOUNTER — Other Ambulatory Visit (HOSPITAL_BASED_OUTPATIENT_CLINIC_OR_DEPARTMENT_OTHER): Payer: Self-pay

## 2022-03-23 ENCOUNTER — Other Ambulatory Visit (HOSPITAL_BASED_OUTPATIENT_CLINIC_OR_DEPARTMENT_OTHER): Payer: Self-pay

## 2022-03-23 MED ORDER — ZOLPIDEM TARTRATE 10 MG PO TABS
ORAL_TABLET | ORAL | 1 refills | Status: DC
  Filled 2022-03-23: qty 90, 90d supply, fill #0
  Filled 2022-09-18: qty 90, 90d supply, fill #1

## 2022-04-12 ENCOUNTER — Other Ambulatory Visit (HOSPITAL_BASED_OUTPATIENT_CLINIC_OR_DEPARTMENT_OTHER): Payer: Self-pay

## 2022-04-18 DIAGNOSIS — M25551 Pain in right hip: Secondary | ICD-10-CM | POA: Diagnosis not present

## 2022-04-18 DIAGNOSIS — M25552 Pain in left hip: Secondary | ICD-10-CM | POA: Diagnosis not present

## 2022-04-18 DIAGNOSIS — M1612 Unilateral primary osteoarthritis, left hip: Secondary | ICD-10-CM | POA: Diagnosis not present

## 2022-04-25 DIAGNOSIS — R739 Hyperglycemia, unspecified: Secondary | ICD-10-CM | POA: Diagnosis not present

## 2022-04-25 DIAGNOSIS — E663 Overweight: Secondary | ICD-10-CM | POA: Diagnosis not present

## 2022-04-25 DIAGNOSIS — E785 Hyperlipidemia, unspecified: Secondary | ICD-10-CM | POA: Diagnosis not present

## 2022-05-07 ENCOUNTER — Other Ambulatory Visit (HOSPITAL_BASED_OUTPATIENT_CLINIC_OR_DEPARTMENT_OTHER): Payer: Self-pay

## 2022-05-07 DIAGNOSIS — E785 Hyperlipidemia, unspecified: Secondary | ICD-10-CM | POA: Diagnosis not present

## 2022-05-07 DIAGNOSIS — R739 Hyperglycemia, unspecified: Secondary | ICD-10-CM | POA: Diagnosis not present

## 2022-05-07 DIAGNOSIS — F418 Other specified anxiety disorders: Secondary | ICD-10-CM | POA: Diagnosis not present

## 2022-05-07 DIAGNOSIS — R7989 Other specified abnormal findings of blood chemistry: Secondary | ICD-10-CM | POA: Diagnosis not present

## 2022-05-08 ENCOUNTER — Other Ambulatory Visit (HOSPITAL_BASED_OUTPATIENT_CLINIC_OR_DEPARTMENT_OTHER): Payer: Self-pay

## 2022-05-08 MED ORDER — VALACYCLOVIR HCL 1 G PO TABS
2000.0000 mg | ORAL_TABLET | Freq: Two times a day (BID) | ORAL | 0 refills | Status: DC
  Filled 2022-05-08: qty 4, 1d supply, fill #0

## 2022-05-14 ENCOUNTER — Other Ambulatory Visit (HOSPITAL_BASED_OUTPATIENT_CLINIC_OR_DEPARTMENT_OTHER): Payer: Self-pay

## 2022-05-14 DIAGNOSIS — M1990 Unspecified osteoarthritis, unspecified site: Secondary | ICD-10-CM | POA: Diagnosis not present

## 2022-05-14 DIAGNOSIS — G4700 Insomnia, unspecified: Secondary | ICD-10-CM | POA: Diagnosis not present

## 2022-05-14 DIAGNOSIS — F325 Major depressive disorder, single episode, in full remission: Secondary | ICD-10-CM | POA: Diagnosis not present

## 2022-05-14 DIAGNOSIS — M8580 Other specified disorders of bone density and structure, unspecified site: Secondary | ICD-10-CM | POA: Diagnosis not present

## 2022-05-14 DIAGNOSIS — Z0000 Encounter for general adult medical examination without abnormal findings: Secondary | ICD-10-CM | POA: Diagnosis not present

## 2022-05-14 DIAGNOSIS — R739 Hyperglycemia, unspecified: Secondary | ICD-10-CM | POA: Diagnosis not present

## 2022-05-14 DIAGNOSIS — K219 Gastro-esophageal reflux disease without esophagitis: Secondary | ICD-10-CM | POA: Diagnosis not present

## 2022-05-14 DIAGNOSIS — F418 Other specified anxiety disorders: Secondary | ICD-10-CM | POA: Diagnosis not present

## 2022-05-14 DIAGNOSIS — E785 Hyperlipidemia, unspecified: Secondary | ICD-10-CM | POA: Diagnosis not present

## 2022-05-14 DIAGNOSIS — Z853 Personal history of malignant neoplasm of breast: Secondary | ICD-10-CM | POA: Diagnosis not present

## 2022-05-14 DIAGNOSIS — R82998 Other abnormal findings in urine: Secondary | ICD-10-CM | POA: Diagnosis not present

## 2022-05-14 DIAGNOSIS — Z86718 Personal history of other venous thrombosis and embolism: Secondary | ICD-10-CM | POA: Diagnosis not present

## 2022-05-14 DIAGNOSIS — C50919 Malignant neoplasm of unspecified site of unspecified female breast: Secondary | ICD-10-CM | POA: Diagnosis not present

## 2022-05-14 MED ORDER — BUPROPION HCL ER (XL) 150 MG PO TB24
150.0000 mg | ORAL_TABLET | Freq: Every morning | ORAL | 3 refills | Status: DC
  Filled 2022-05-14: qty 90, 90d supply, fill #0

## 2022-05-14 MED ORDER — BUSPIRONE HCL 5 MG PO TABS
5.0000 mg | ORAL_TABLET | Freq: Every day | ORAL | 5 refills | Status: AC
  Filled 2022-05-14: qty 30, 30d supply, fill #0
  Filled 2022-06-05 – 2022-06-07 (×2): qty 30, 30d supply, fill #1
  Filled 2022-09-14: qty 30, 30d supply, fill #2

## 2022-05-14 MED ORDER — EPINEPHRINE 0.3 MG/0.3ML IJ SOAJ
INTRAMUSCULAR | 0 refills | Status: DC
  Filled 2022-05-14: qty 2, 30d supply, fill #0

## 2022-05-31 DIAGNOSIS — S61210A Laceration without foreign body of right index finger without damage to nail, initial encounter: Secondary | ICD-10-CM | POA: Diagnosis not present

## 2022-06-05 ENCOUNTER — Other Ambulatory Visit (HOSPITAL_BASED_OUTPATIENT_CLINIC_OR_DEPARTMENT_OTHER): Payer: Self-pay

## 2022-06-05 MED ORDER — WEGOVY 2.4 MG/0.75ML ~~LOC~~ SOAJ
2.4000 mg | SUBCUTANEOUS | 5 refills | Status: DC
  Filled 2022-06-05: qty 3, 28d supply, fill #0
  Filled 2022-07-04: qty 3, 28d supply, fill #1

## 2022-06-07 ENCOUNTER — Other Ambulatory Visit (HOSPITAL_BASED_OUTPATIENT_CLINIC_OR_DEPARTMENT_OTHER): Payer: Self-pay

## 2022-06-07 MED ORDER — TRAZODONE HCL 50 MG PO TABS
50.0000 mg | ORAL_TABLET | Freq: Every day | ORAL | 1 refills | Status: DC
  Filled 2022-06-07: qty 30, 30d supply, fill #0
  Filled 2022-07-04: qty 30, 30d supply, fill #1

## 2022-06-07 MED ORDER — TRAZODONE HCL 50 MG PO TABS
50.0000 mg | ORAL_TABLET | Freq: Every day | ORAL | 1 refills | Status: DC
  Filled 2022-09-14: qty 30, 30d supply, fill #0

## 2022-06-08 ENCOUNTER — Other Ambulatory Visit (HOSPITAL_BASED_OUTPATIENT_CLINIC_OR_DEPARTMENT_OTHER): Payer: Self-pay

## 2022-06-11 ENCOUNTER — Other Ambulatory Visit (HOSPITAL_BASED_OUTPATIENT_CLINIC_OR_DEPARTMENT_OTHER): Payer: Self-pay

## 2022-06-15 ENCOUNTER — Other Ambulatory Visit (HOSPITAL_BASED_OUTPATIENT_CLINIC_OR_DEPARTMENT_OTHER): Payer: Self-pay

## 2022-06-21 ENCOUNTER — Other Ambulatory Visit (HOSPITAL_BASED_OUTPATIENT_CLINIC_OR_DEPARTMENT_OTHER): Payer: Self-pay

## 2022-06-21 MED ORDER — NITROFURANTOIN MONOHYD MACRO 100 MG PO CAPS
ORAL_CAPSULE | ORAL | 0 refills | Status: DC
  Filled 2022-06-21: qty 14, 7d supply, fill #0

## 2022-06-21 MED ORDER — TRAZODONE HCL 100 MG PO TABS
100.0000 mg | ORAL_TABLET | Freq: Every day | ORAL | 3 refills | Status: DC
  Filled 2022-06-21: qty 90, 90d supply, fill #0
  Filled 2022-09-18: qty 90, 90d supply, fill #1

## 2022-06-21 MED ORDER — NITROFURANTOIN MACROCRYSTAL 100 MG PO CAPS
100.0000 mg | ORAL_CAPSULE | Freq: Two times a day (BID) | ORAL | 0 refills | Status: AC
  Filled 2022-06-21: qty 14, 7d supply, fill #0

## 2022-07-03 DIAGNOSIS — Z23 Encounter for immunization: Secondary | ICD-10-CM | POA: Diagnosis not present

## 2022-07-04 ENCOUNTER — Other Ambulatory Visit (HOSPITAL_BASED_OUTPATIENT_CLINIC_OR_DEPARTMENT_OTHER): Payer: Self-pay

## 2022-07-10 DIAGNOSIS — E785 Hyperlipidemia, unspecified: Secondary | ICD-10-CM | POA: Diagnosis not present

## 2022-07-10 DIAGNOSIS — E663 Overweight: Secondary | ICD-10-CM | POA: Diagnosis not present

## 2022-07-31 ENCOUNTER — Other Ambulatory Visit: Payer: Self-pay | Admitting: Internal Medicine

## 2022-07-31 ENCOUNTER — Other Ambulatory Visit (HOSPITAL_BASED_OUTPATIENT_CLINIC_OR_DEPARTMENT_OTHER): Payer: Self-pay

## 2022-07-31 DIAGNOSIS — R140 Abdominal distension (gaseous): Secondary | ICD-10-CM | POA: Diagnosis not present

## 2022-07-31 DIAGNOSIS — E663 Overweight: Secondary | ICD-10-CM | POA: Diagnosis not present

## 2022-07-31 DIAGNOSIS — K219 Gastro-esophageal reflux disease without esophagitis: Secondary | ICD-10-CM | POA: Diagnosis not present

## 2022-07-31 MED ORDER — WEGOVY 1.7 MG/0.75ML ~~LOC~~ SOAJ
1.7000 mg | SUBCUTANEOUS | 3 refills | Status: DC
  Filled 2022-07-31: qty 3, 28d supply, fill #0

## 2022-08-09 ENCOUNTER — Ambulatory Visit
Admission: RE | Admit: 2022-08-09 | Discharge: 2022-08-09 | Disposition: A | Discharge status: Home / Self Care | Payer: Medicare Other | Source: Ambulatory Visit | Attending: Internal Medicine | Admitting: Internal Medicine

## 2022-08-09 DIAGNOSIS — K760 Fatty (change of) liver, not elsewhere classified: Secondary | ICD-10-CM | POA: Diagnosis not present

## 2022-08-09 DIAGNOSIS — R140 Abdominal distension (gaseous): Secondary | ICD-10-CM | POA: Diagnosis not present

## 2022-08-17 ENCOUNTER — Other Ambulatory Visit (HOSPITAL_BASED_OUTPATIENT_CLINIC_OR_DEPARTMENT_OTHER): Payer: Self-pay

## 2022-08-17 MED ORDER — AZITHROMYCIN 250 MG PO TABS
ORAL_TABLET | ORAL | 0 refills | Status: DC
  Filled 2022-08-17: qty 6, 5d supply, fill #0

## 2022-08-22 ENCOUNTER — Other Ambulatory Visit: Payer: Medicare Other

## 2022-08-22 ENCOUNTER — Other Ambulatory Visit (HOSPITAL_BASED_OUTPATIENT_CLINIC_OR_DEPARTMENT_OTHER): Payer: Self-pay

## 2022-09-05 ENCOUNTER — Other Ambulatory Visit (HOSPITAL_BASED_OUTPATIENT_CLINIC_OR_DEPARTMENT_OTHER): Payer: Self-pay

## 2022-09-06 ENCOUNTER — Other Ambulatory Visit (HOSPITAL_BASED_OUTPATIENT_CLINIC_OR_DEPARTMENT_OTHER): Payer: Self-pay

## 2022-09-06 MED ORDER — WEGOVY 1.7 MG/0.75ML ~~LOC~~ SOAJ
SUBCUTANEOUS | 3 refills | Status: DC
  Filled 2022-09-06: qty 3, 28d supply, fill #0

## 2022-09-10 ENCOUNTER — Other Ambulatory Visit (HOSPITAL_BASED_OUTPATIENT_CLINIC_OR_DEPARTMENT_OTHER): Payer: Self-pay

## 2022-09-14 ENCOUNTER — Other Ambulatory Visit (HOSPITAL_BASED_OUTPATIENT_CLINIC_OR_DEPARTMENT_OTHER): Payer: Self-pay

## 2022-09-18 ENCOUNTER — Other Ambulatory Visit (HOSPITAL_BASED_OUTPATIENT_CLINIC_OR_DEPARTMENT_OTHER): Payer: Self-pay

## 2022-09-19 ENCOUNTER — Other Ambulatory Visit (HOSPITAL_BASED_OUTPATIENT_CLINIC_OR_DEPARTMENT_OTHER): Payer: Self-pay

## 2022-09-19 MED ORDER — OMEPRAZOLE 40 MG PO CPDR
40.0000 mg | DELAYED_RELEASE_CAPSULE | Freq: Every day | ORAL | 3 refills | Status: DC
  Filled 2022-09-19 – 2022-12-24 (×2): qty 90, 90d supply, fill #0
  Filled 2023-03-27: qty 90, 90d supply, fill #1
  Filled 2023-06-21: qty 90, 90d supply, fill #2

## 2022-09-19 MED ORDER — WEGOVY 2.4 MG/0.75ML ~~LOC~~ SOAJ
2.4000 mg | SUBCUTANEOUS | 3 refills | Status: DC
  Filled 2022-09-19 – 2022-10-02 (×2): qty 3, 28d supply, fill #0
  Filled 2022-10-29: qty 3, 28d supply, fill #1
  Filled 2022-11-28: qty 3, 28d supply, fill #2
  Filled 2022-12-24: qty 3, 28d supply, fill #3
  Filled 2023-01-18: qty 3, 28d supply, fill #4
  Filled 2023-02-18: qty 3, 28d supply, fill #5
  Filled 2023-03-20: qty 3, 28d supply, fill #6
  Filled 2023-04-16: qty 3, 28d supply, fill #7
  Filled 2023-05-07: qty 3, 28d supply, fill #8

## 2022-09-19 MED ORDER — BUPROPION HCL ER (XL) 300 MG PO TB24
300.0000 mg | ORAL_TABLET | Freq: Every morning | ORAL | 2 refills | Status: DC
  Filled 2022-09-19: qty 90, 90d supply, fill #0
  Filled 2023-01-30: qty 90, 90d supply, fill #1
  Filled 2023-04-25: qty 90, 90d supply, fill #2

## 2022-09-19 MED ORDER — TRAZODONE HCL 100 MG PO TABS
100.0000 mg | ORAL_TABLET | Freq: Every day | ORAL | 3 refills | Status: DC
  Filled 2022-09-19: qty 90, 90d supply, fill #0

## 2022-10-02 ENCOUNTER — Other Ambulatory Visit (HOSPITAL_BASED_OUTPATIENT_CLINIC_OR_DEPARTMENT_OTHER): Payer: Self-pay

## 2022-10-04 ENCOUNTER — Other Ambulatory Visit: Payer: Self-pay | Admitting: Internal Medicine

## 2022-10-04 DIAGNOSIS — Z1231 Encounter for screening mammogram for malignant neoplasm of breast: Secondary | ICD-10-CM

## 2022-10-29 ENCOUNTER — Other Ambulatory Visit (HOSPITAL_BASED_OUTPATIENT_CLINIC_OR_DEPARTMENT_OTHER): Payer: Self-pay

## 2022-10-31 DIAGNOSIS — D2371 Other benign neoplasm of skin of right lower limb, including hip: Secondary | ICD-10-CM | POA: Diagnosis not present

## 2022-10-31 DIAGNOSIS — D225 Melanocytic nevi of trunk: Secondary | ICD-10-CM | POA: Diagnosis not present

## 2022-10-31 DIAGNOSIS — Z85820 Personal history of malignant melanoma of skin: Secondary | ICD-10-CM | POA: Diagnosis not present

## 2022-10-31 DIAGNOSIS — L821 Other seborrheic keratosis: Secondary | ICD-10-CM | POA: Diagnosis not present

## 2022-10-31 DIAGNOSIS — D1801 Hemangioma of skin and subcutaneous tissue: Secondary | ICD-10-CM | POA: Diagnosis not present

## 2022-10-31 DIAGNOSIS — D692 Other nonthrombocytopenic purpura: Secondary | ICD-10-CM | POA: Diagnosis not present

## 2022-10-31 DIAGNOSIS — L814 Other melanin hyperpigmentation: Secondary | ICD-10-CM | POA: Diagnosis not present

## 2022-10-31 DIAGNOSIS — Z85828 Personal history of other malignant neoplasm of skin: Secondary | ICD-10-CM | POA: Diagnosis not present

## 2022-11-07 ENCOUNTER — Other Ambulatory Visit (HOSPITAL_BASED_OUTPATIENT_CLINIC_OR_DEPARTMENT_OTHER): Payer: Self-pay

## 2022-11-07 DIAGNOSIS — E785 Hyperlipidemia, unspecified: Secondary | ICD-10-CM | POA: Diagnosis not present

## 2022-11-07 DIAGNOSIS — G4700 Insomnia, unspecified: Secondary | ICD-10-CM | POA: Diagnosis not present

## 2022-11-07 DIAGNOSIS — R739 Hyperglycemia, unspecified: Secondary | ICD-10-CM | POA: Diagnosis not present

## 2022-11-07 DIAGNOSIS — E663 Overweight: Secondary | ICD-10-CM | POA: Diagnosis not present

## 2022-11-07 MED ORDER — WEGOVY 1.7 MG/0.75ML ~~LOC~~ SOAJ: 1.7000 mg | SUBCUTANEOUS | 5 refills | Status: DC

## 2022-11-08 ENCOUNTER — Other Ambulatory Visit (HOSPITAL_BASED_OUTPATIENT_CLINIC_OR_DEPARTMENT_OTHER): Payer: Self-pay

## 2022-11-22 ENCOUNTER — Ambulatory Visit
Admission: RE | Admit: 2022-11-22 | Discharge: 2022-11-22 | Disposition: A | Discharge status: Home / Self Care | Payer: Medicare Other | Source: Ambulatory Visit | Attending: Internal Medicine | Admitting: Internal Medicine

## 2022-11-22 DIAGNOSIS — Z1231 Encounter for screening mammogram for malignant neoplasm of breast: Secondary | ICD-10-CM

## 2022-11-26 ENCOUNTER — Other Ambulatory Visit: Payer: Self-pay | Admitting: Internal Medicine

## 2022-11-26 DIAGNOSIS — R928 Other abnormal and inconclusive findings on diagnostic imaging of breast: Secondary | ICD-10-CM

## 2022-11-28 ENCOUNTER — Other Ambulatory Visit (HOSPITAL_BASED_OUTPATIENT_CLINIC_OR_DEPARTMENT_OTHER): Payer: Self-pay

## 2022-12-05 ENCOUNTER — Ambulatory Visit
Admission: RE | Admit: 2022-12-05 | Discharge: 2022-12-05 | Disposition: A | Discharge status: Home / Self Care | Payer: Medicare Other | Source: Ambulatory Visit | Attending: Internal Medicine | Admitting: Internal Medicine

## 2022-12-05 ENCOUNTER — Ambulatory Visit: Payer: Medicare Other

## 2022-12-05 DIAGNOSIS — R928 Other abnormal and inconclusive findings on diagnostic imaging of breast: Secondary | ICD-10-CM

## 2022-12-05 DIAGNOSIS — R922 Inconclusive mammogram: Secondary | ICD-10-CM | POA: Diagnosis not present

## 2022-12-16 IMAGING — MG DIGITAL DIAGNOSTIC BILAT W/ TOMO W/ CAD
8 series · 8 of 24 positions shown · non-contrast
Comparison: Previous exam(s).

CLINICAL DATA: 80-year-old female presenting for annual exam.
History of right breast cancer in 6818 status post lumpectomy.
Patient has chronic diffuse pain in the superior right breast.

EXAM:
DIGITAL DIAGNOSTIC BILATERAL MAMMOGRAM WITH TOMOSYNTHESIS AND CAD
TECHNIQUE: Bilateral digital diagnostic mammography and breast tomosynthesis
was performed. The images were evaluated with computer-aided
detection.

[R MLO synth-2D]
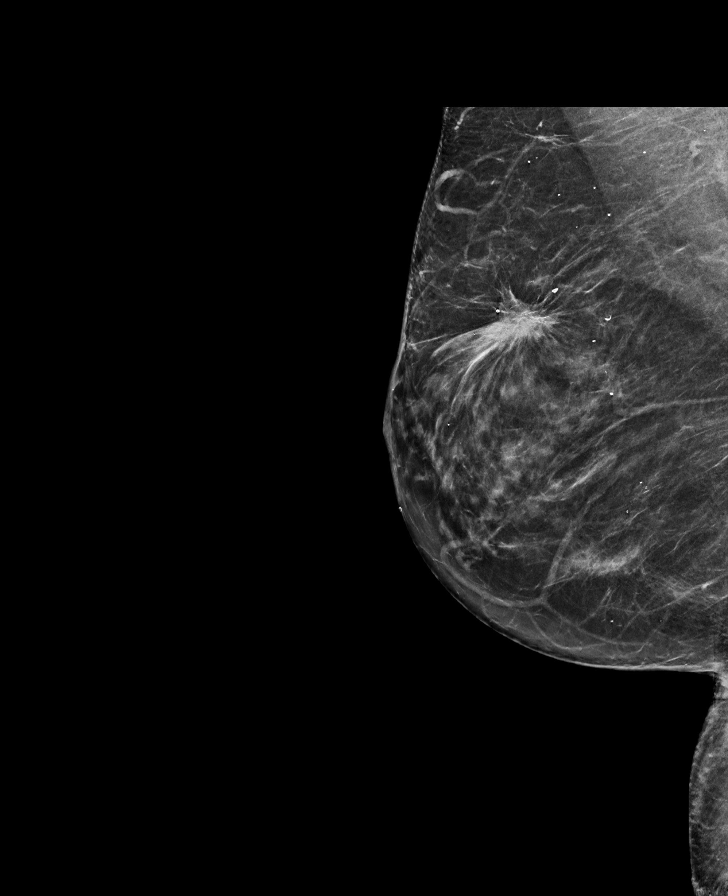

[L MLO synth-2D]
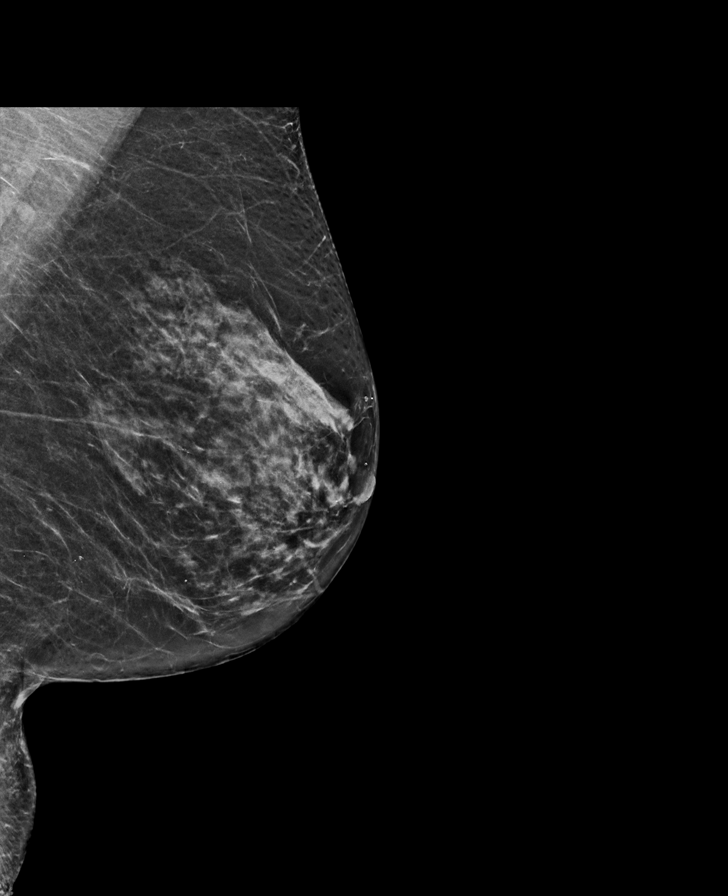

[R CC synth-2D]
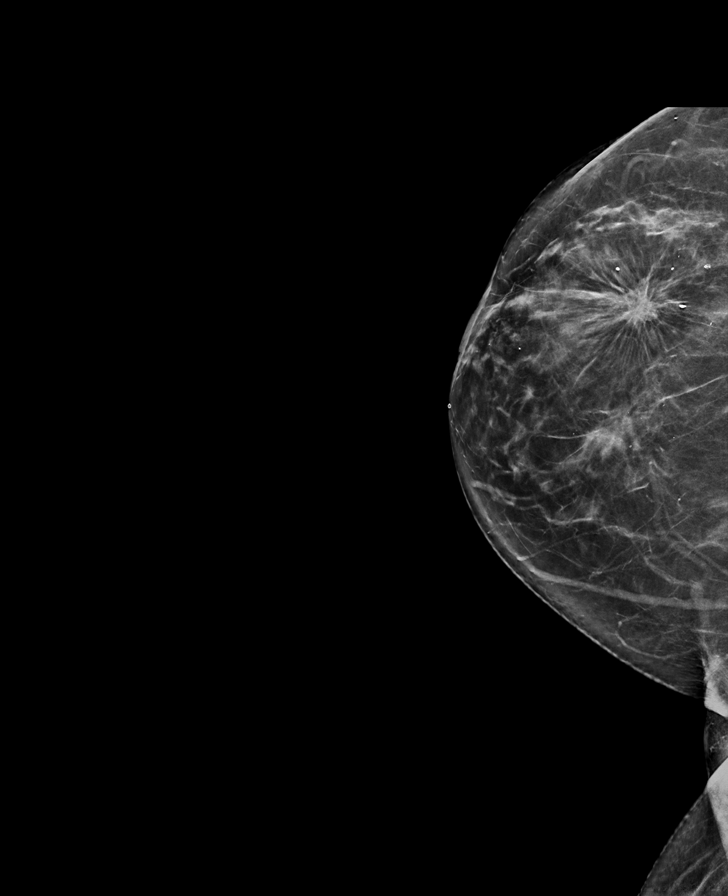

[L CC synth-2D]
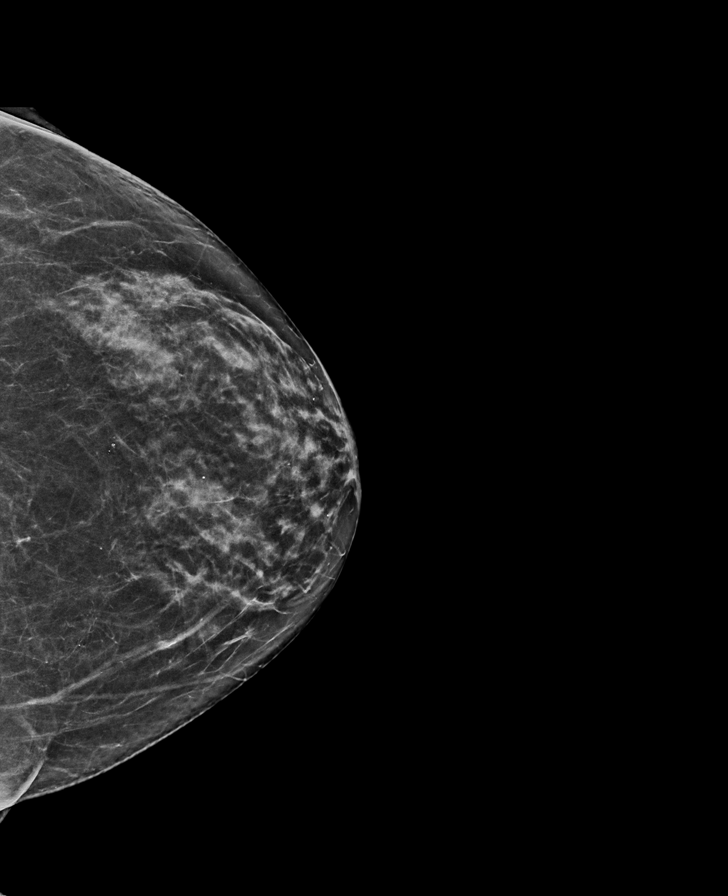

[R MLO tomo · tomo slice 35/69.0]
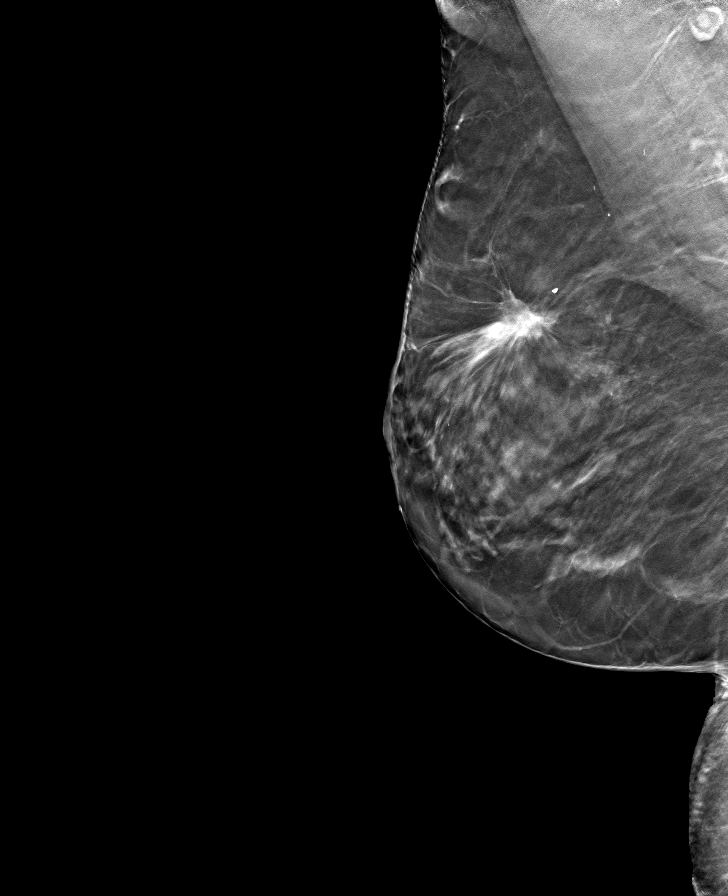

[L CC tomo · tomo slice 34/67.0]
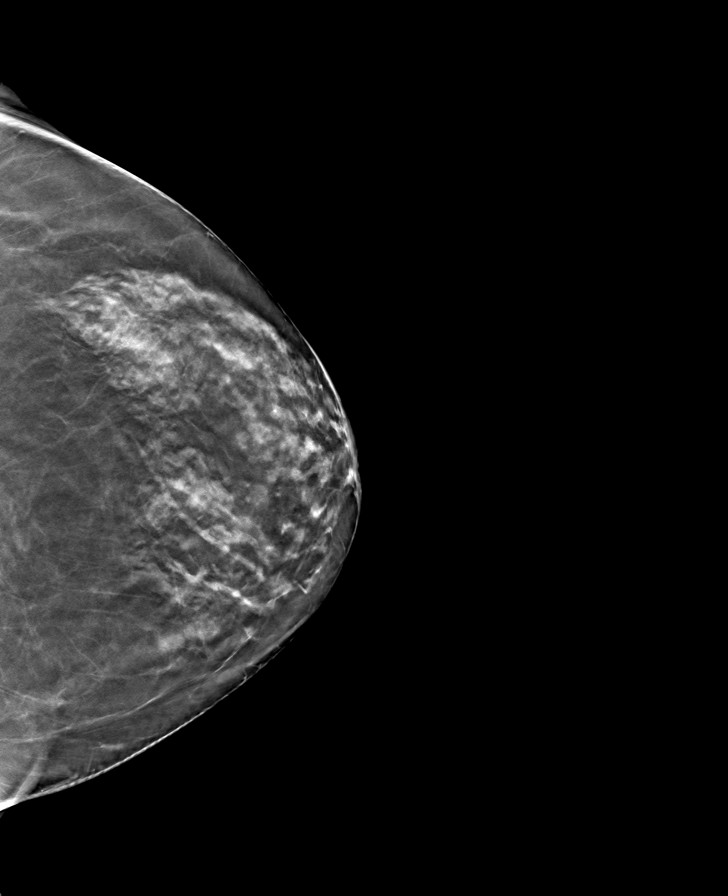

[R CC tomo · tomo slice 37/72.0]
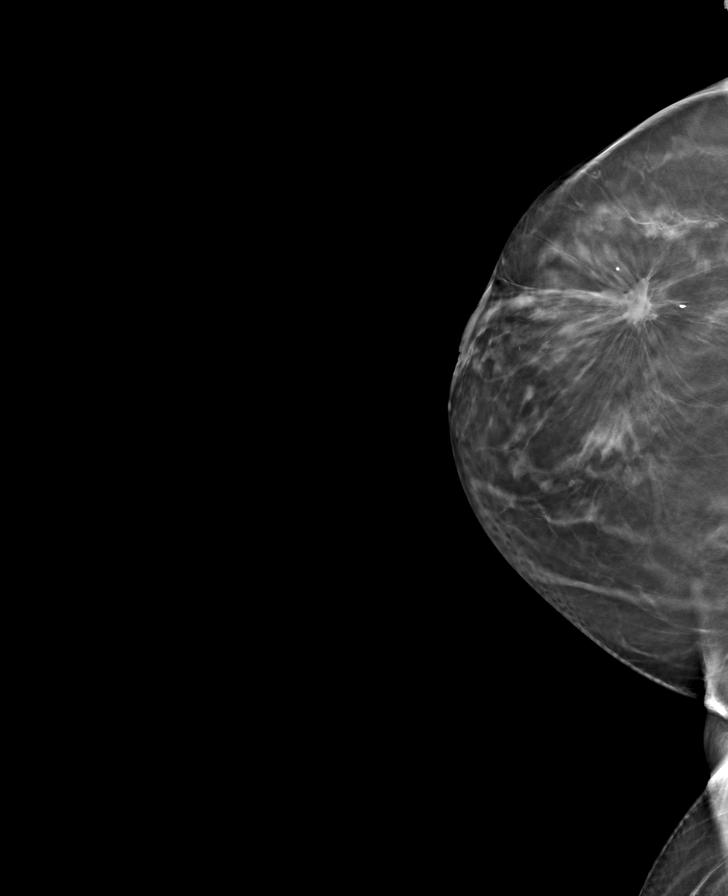

[L MLO tomo · tomo slice 33/64.0]
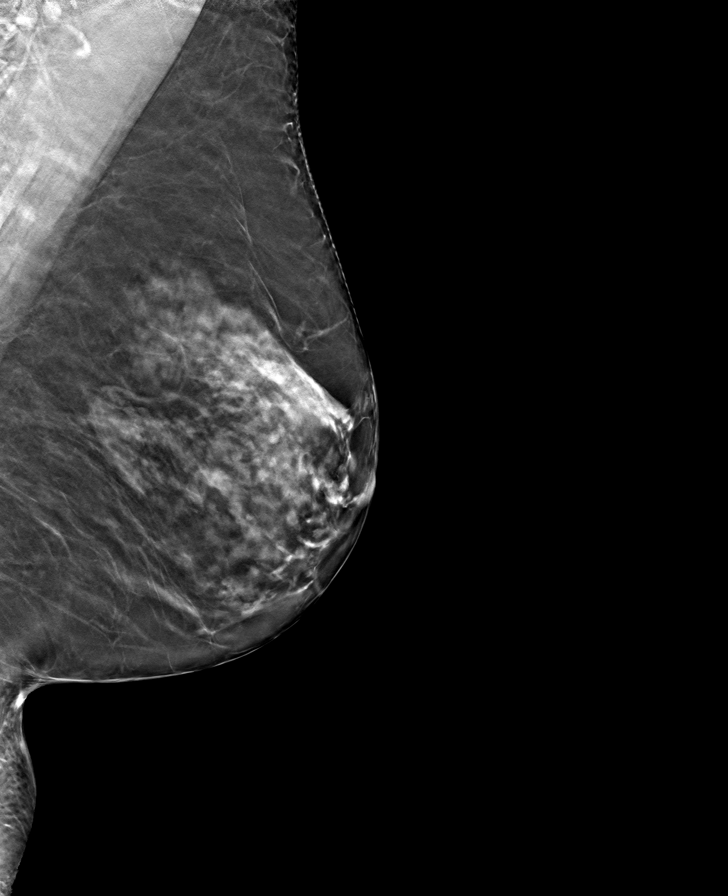

[8 of 24 positions shown; findings below may reference images not displayed]

ACR Breast Density Category c: The breast tissue is heterogeneously
dense, which may obscure small masses.
FINDINGS: Right breast: There are stable postsurgical changes at the
lumpectomy site in the superior right breast. No suspicious mass,
distortion, or microcalcifications are identified to suggest
presence of malignancy.

Left breast: No suspicious mass, distortion, or microcalcifications
are identified to suggest presence of malignancy.
IMPRESSION: 1. Stable postsurgical changes in the right breast. No mammographic
evidence of malignancy or other finding to explain the patient's
chronic pain.

2.  No mammographic evidence of malignancy in the left breast.

RECOMMENDATION:
Screening mammogram in one year.(Code:IN-D-W7D)

I have discussed the findings and recommendations with the patient.
If applicable, a reminder letter will be sent to the patient
regarding the next appointment.

BI-RADS CATEGORY  2: Benign.

## 2022-12-24 ENCOUNTER — Other Ambulatory Visit (HOSPITAL_BASED_OUTPATIENT_CLINIC_OR_DEPARTMENT_OTHER): Payer: Self-pay

## 2022-12-26 ENCOUNTER — Other Ambulatory Visit (HOSPITAL_BASED_OUTPATIENT_CLINIC_OR_DEPARTMENT_OTHER): Payer: Self-pay

## 2022-12-26 ENCOUNTER — Encounter (HOSPITAL_BASED_OUTPATIENT_CLINIC_OR_DEPARTMENT_OTHER): Payer: Self-pay | Admitting: Pharmacist

## 2022-12-28 ENCOUNTER — Other Ambulatory Visit (HOSPITAL_BASED_OUTPATIENT_CLINIC_OR_DEPARTMENT_OTHER): Payer: Self-pay

## 2022-12-28 DIAGNOSIS — H52203 Unspecified astigmatism, bilateral: Secondary | ICD-10-CM | POA: Diagnosis not present

## 2022-12-28 DIAGNOSIS — H353131 Nonexudative age-related macular degeneration, bilateral, early dry stage: Secondary | ICD-10-CM | POA: Diagnosis not present

## 2022-12-28 DIAGNOSIS — H524 Presbyopia: Secondary | ICD-10-CM | POA: Diagnosis not present

## 2022-12-28 DIAGNOSIS — Z961 Presence of intraocular lens: Secondary | ICD-10-CM | POA: Diagnosis not present

## 2022-12-28 MED ORDER — ZOLPIDEM TARTRATE 5 MG PO TABS
2.5000 mg | ORAL_TABLET | Freq: Every evening | ORAL | 1 refills | Status: DC | PRN
  Filled 2022-12-28: qty 30, 30d supply, fill #0
  Filled 2023-01-30: qty 30, 30d supply, fill #1

## 2022-12-31 ENCOUNTER — Other Ambulatory Visit (HOSPITAL_BASED_OUTPATIENT_CLINIC_OR_DEPARTMENT_OTHER): Payer: Self-pay

## 2023-01-04 DIAGNOSIS — M19071 Primary osteoarthritis, right ankle and foot: Secondary | ICD-10-CM | POA: Diagnosis not present

## 2023-01-08 ENCOUNTER — Other Ambulatory Visit (HOSPITAL_BASED_OUTPATIENT_CLINIC_OR_DEPARTMENT_OTHER): Payer: Self-pay

## 2023-01-08 MED ORDER — GABAPENTIN 100 MG PO CAPS
100.0000 mg | ORAL_CAPSULE | Freq: Every evening | ORAL | 0 refills | Status: DC | PRN
  Filled 2023-01-08: qty 30, 30d supply, fill #0

## 2023-01-09 ENCOUNTER — Other Ambulatory Visit: Payer: Self-pay

## 2023-01-09 ENCOUNTER — Other Ambulatory Visit (HOSPITAL_BASED_OUTPATIENT_CLINIC_OR_DEPARTMENT_OTHER): Payer: Self-pay

## 2023-01-09 MED ORDER — TRAMADOL HCL 50 MG PO TABS
50.0000 mg | ORAL_TABLET | Freq: Four times a day (QID) | ORAL | 0 refills | Status: DC | PRN
  Filled 2023-01-09 (×2): qty 20, 5d supply, fill #0

## 2023-01-18 ENCOUNTER — Other Ambulatory Visit (HOSPITAL_BASED_OUTPATIENT_CLINIC_OR_DEPARTMENT_OTHER): Payer: Self-pay

## 2023-01-23 DIAGNOSIS — M79671 Pain in right foot: Secondary | ICD-10-CM | POA: Diagnosis not present

## 2023-01-30 ENCOUNTER — Other Ambulatory Visit (HOSPITAL_BASED_OUTPATIENT_CLINIC_OR_DEPARTMENT_OTHER): Payer: Self-pay

## 2023-02-12 ENCOUNTER — Other Ambulatory Visit (HOSPITAL_BASED_OUTPATIENT_CLINIC_OR_DEPARTMENT_OTHER): Payer: Self-pay

## 2023-02-12 MED ORDER — AMOXICILLIN 500 MG PO CAPS
500.0000 mg | ORAL_CAPSULE | Freq: Three times a day (TID) | ORAL | 0 refills | Status: AC
  Filled 2023-02-12: qty 30, 10d supply, fill #0

## 2023-02-13 ENCOUNTER — Other Ambulatory Visit (HOSPITAL_BASED_OUTPATIENT_CLINIC_OR_DEPARTMENT_OTHER): Payer: Self-pay

## 2023-02-14 DIAGNOSIS — D0461 Carcinoma in situ of skin of right upper limb, including shoulder: Secondary | ICD-10-CM | POA: Diagnosis not present

## 2023-02-14 DIAGNOSIS — Z85828 Personal history of other malignant neoplasm of skin: Secondary | ICD-10-CM | POA: Diagnosis not present

## 2023-02-14 DIAGNOSIS — Z85820 Personal history of malignant melanoma of skin: Secondary | ICD-10-CM | POA: Diagnosis not present

## 2023-02-14 DIAGNOSIS — L720 Epidermal cyst: Secondary | ICD-10-CM | POA: Diagnosis not present

## 2023-02-18 ENCOUNTER — Other Ambulatory Visit (HOSPITAL_BASED_OUTPATIENT_CLINIC_OR_DEPARTMENT_OTHER): Payer: Self-pay

## 2023-02-19 ENCOUNTER — Other Ambulatory Visit (HOSPITAL_BASED_OUTPATIENT_CLINIC_OR_DEPARTMENT_OTHER): Payer: Self-pay

## 2023-02-19 DIAGNOSIS — M19071 Primary osteoarthritis, right ankle and foot: Secondary | ICD-10-CM | POA: Diagnosis not present

## 2023-02-19 MED ORDER — MELOXICAM 7.5 MG PO TABS
7.5000 mg | ORAL_TABLET | Freq: Every day | ORAL | 0 refills | Status: DC | PRN
  Filled 2023-02-19 (×2): qty 45, 45d supply, fill #0

## 2023-02-25 ENCOUNTER — Other Ambulatory Visit (HOSPITAL_BASED_OUTPATIENT_CLINIC_OR_DEPARTMENT_OTHER): Payer: Self-pay

## 2023-02-26 ENCOUNTER — Other Ambulatory Visit (HOSPITAL_BASED_OUTPATIENT_CLINIC_OR_DEPARTMENT_OTHER): Payer: Self-pay

## 2023-02-26 MED ORDER — AMOXICILLIN 500 MG PO CAPS
500.0000 mg | ORAL_CAPSULE | Freq: Three times a day (TID) | ORAL | 0 refills | Status: DC
  Filled 2023-02-26: qty 15, 5d supply, fill #0

## 2023-03-01 ENCOUNTER — Other Ambulatory Visit (HOSPITAL_BASED_OUTPATIENT_CLINIC_OR_DEPARTMENT_OTHER): Payer: Self-pay

## 2023-03-01 ENCOUNTER — Other Ambulatory Visit: Payer: Self-pay

## 2023-03-01 MED ORDER — ZOLPIDEM TARTRATE 5 MG PO TABS
7.5000 mg | ORAL_TABLET | Freq: Every evening | ORAL | 0 refills | Status: DC | PRN
  Filled 2023-03-01: qty 45, 30d supply, fill #0

## 2023-03-04 ENCOUNTER — Other Ambulatory Visit (HOSPITAL_BASED_OUTPATIENT_CLINIC_OR_DEPARTMENT_OTHER): Payer: Self-pay

## 2023-03-08 DIAGNOSIS — E663 Overweight: Secondary | ICD-10-CM | POA: Diagnosis not present

## 2023-03-08 DIAGNOSIS — E785 Hyperlipidemia, unspecified: Secondary | ICD-10-CM | POA: Diagnosis not present

## 2023-03-18 DIAGNOSIS — M791 Myalgia, unspecified site: Secondary | ICD-10-CM | POA: Diagnosis not present

## 2023-03-18 DIAGNOSIS — M25512 Pain in left shoulder: Secondary | ICD-10-CM | POA: Diagnosis not present

## 2023-03-20 ENCOUNTER — Other Ambulatory Visit (HOSPITAL_BASED_OUTPATIENT_CLINIC_OR_DEPARTMENT_OTHER): Payer: Self-pay

## 2023-03-25 ENCOUNTER — Other Ambulatory Visit (HOSPITAL_BASED_OUTPATIENT_CLINIC_OR_DEPARTMENT_OTHER): Payer: Self-pay

## 2023-03-25 MED ORDER — TRIAMCINOLONE ACETONIDE 0.1 % EX CREA
TOPICAL_CREAM | CUTANEOUS | 0 refills | Status: AC
  Filled 2023-03-25: qty 80, 30d supply, fill #0

## 2023-03-26 ENCOUNTER — Other Ambulatory Visit (HOSPITAL_BASED_OUTPATIENT_CLINIC_OR_DEPARTMENT_OTHER): Payer: Self-pay

## 2023-03-27 ENCOUNTER — Other Ambulatory Visit (HOSPITAL_BASED_OUTPATIENT_CLINIC_OR_DEPARTMENT_OTHER): Payer: Self-pay

## 2023-03-29 ENCOUNTER — Other Ambulatory Visit (HOSPITAL_BASED_OUTPATIENT_CLINIC_OR_DEPARTMENT_OTHER): Payer: Self-pay

## 2023-04-01 DIAGNOSIS — M25512 Pain in left shoulder: Secondary | ICD-10-CM | POA: Diagnosis not present

## 2023-04-09 ENCOUNTER — Emergency Department (HOSPITAL_BASED_OUTPATIENT_CLINIC_OR_DEPARTMENT_OTHER): Payer: Medicare Other

## 2023-04-09 ENCOUNTER — Encounter (HOSPITAL_BASED_OUTPATIENT_CLINIC_OR_DEPARTMENT_OTHER): Payer: Self-pay

## 2023-04-09 ENCOUNTER — Other Ambulatory Visit: Payer: Self-pay

## 2023-04-09 ENCOUNTER — Emergency Department (HOSPITAL_BASED_OUTPATIENT_CLINIC_OR_DEPARTMENT_OTHER)
Admission: EM | Admit: 2023-04-09 | Discharge: 2023-04-09 | Disposition: A | Discharge status: Home / Self Care | Payer: Medicare Other

## 2023-04-09 DIAGNOSIS — S6991XA Unspecified injury of right wrist, hand and finger(s), initial encounter: Secondary | ICD-10-CM | POA: Diagnosis not present

## 2023-04-09 DIAGNOSIS — S6000XA Contusion of unspecified finger without damage to nail, initial encounter: Secondary | ICD-10-CM | POA: Insufficient documentation

## 2023-04-09 DIAGNOSIS — W1839XA Other fall on same level, initial encounter: Secondary | ICD-10-CM | POA: Insufficient documentation

## 2023-04-09 DIAGNOSIS — M79641 Pain in right hand: Secondary | ICD-10-CM | POA: Diagnosis not present

## 2023-04-09 MED ORDER — ACETAMINOPHEN 500 MG PO TABS
1000.0000 mg | ORAL_TABLET | Freq: Once | ORAL | Status: AC
  Administered 2023-04-09: 1000 mg via ORAL
  Filled 2023-04-09: qty 2

## 2023-04-09 NOTE — Discharge Instructions (Signed)
May take Tylenol alternating with ibuprofen for pain.  You may continue to buddy tape your fingers together for comfort.  Please follow-up with your primary doctor.  Turn immediately to help fevers, chills, headache, vision changes, numbness tingling changes in sensation or severe pain.  Also, please do not hesitate to return if developing any worsening symptoms that are concerning to you.

## 2023-04-09 NOTE — ED Triage Notes (Addendum)
Patient states she lost her balance trying to take her shoes off, fell over and hurt her right finger, states "I tried to put it back in place and pulled on it with my other arm but when I did, now the other wrist is tender." No obvious deformity to either wrist noted. Denies head injury.

## 2023-04-09 NOTE — ED Provider Notes (Signed)
[place] EMERGENCY DEPARTMENT AT [facility] Provider Note   CSN: [id number] Arrival date & time: 04/09/23  1027     History  Chief Complaint  Patient presents with   Jillian Norton is a 82 y.o. female.  Presented to the emergency department with right finger pain after fall.  States she stumbled over while she was reaching for her shoes caught herself and jammed her finger on her right hand.  Did not hit her head no LOC.  No preceding symptoms.     Fall       Home Medications Prior to Admission medications   Medication Sig Start Date End Date Taking? Authorizing Provider  acetaminophen (TYLENOL) 325 MG tablet Take 650 mg by mouth as needed. Pt stated, "When I have pain, takes 3 tablets at a time for the pain; it does help"    [provider]  acyclovir (ZOVIRAX) 800 MG tablet Take by mouth.    [provider]  albuterol (VENTOLIN HFA) 108 (90 Base) MCG/ACT inhaler Use 2 puffs 2x a day prn Inhalation as directed 30 days 08/31/21     amoxicillin (AMOXIL) 500 MG capsule Take 1 capsule (500 mg total) by mouth in the morning, at noon, and at bedtime. 02/25/23     ascorbic acid (VITAMIN C) 250 MG tablet Take by mouth.    [provider]  azithromycin (ZITHROMAX) 250 MG tablet Take as directed 05/11/20   Lenn Sink, DPM  azithromycin (ZITHROMAX) 250 MG tablet Take as directed on pack. 08/17/22     Biotin 2.5 MG CAPS Take by mouth.    [provider]  buPROPion (WELLBUTRIN XL) 150 MG 24 hr tablet Take 1 tablet (150 mg total) by mouth every morning. 05/14/22     buPROPion (WELLBUTRIN XL) 300 MG 24 hr tablet Take 1 tablet (300 mg total) by mouth in the morning. 09/19/22     busPIRone (BUSPAR) 5 MG tablet Take 1 tablet (5 mg total) by mouth daily. 05/14/22     calcium-vitamin D (OSCAL WITH D) 500-200 MG-UNIT TABS tablet Take by mouth.    [provider]  cefdinir (OMNICEF) 300 MG capsule as directed Orally twice daily 7 days  08/31/21     celecoxib (CELEBREX) 200 MG capsule Take 1 capsule every day by oral route. 07/24/21     COVID-19 mRNA bivalent vaccine, Moderna, (MODERNA COVID-19 BIVAL BOOSTER) 50 MCG/0.5ML injection Inject into the muscle. 06/12/21   Judyann Munson, MD  cyclobenzaprine (FLEXERIL) 10 MG tablet 1 tablet PO twice daily as directed muscle relaxer as needed Orally twice a day 10 days 03/02/22     Diclofenac Sodium (VOLTAREN EX) Apply topically as needed.    [provider]  EPINEPHrine (EPIPEN 2-PAK) 0.3 mg/0.3 mL IJ SOAJ injection as directed Injection daily 1 days 04/28/21     EPINEPHrine (EPIPEN 2-PAK) 0.3 mg/0.3 mL IJ SOAJ injection Inject as directed 05/14/22     fexofenadine (ALLEGRA) 180 MG tablet 180 mg. 08/18/13   [provider]  gabapentin (NEURONTIN) 100 MG capsule Take 1 capsule (100 mg total) by mouth at bedtime as needed. 01/08/23   Arlyce Harman, MD  HYDROcodone bit-homatropine (HYDROMET) 5-1.5 MG/5ML syrup 5 mL as needed Orally every 6 hrs 7 days 08/31/21     hydrOXYzine (ATARAX/VISTARIL) 25 MG tablet Take 1-2 tabs every 8 hours as needed for itching.  Will make you sleepy. 07/02/16   [provider]  influenza vaccine adjuvanted (FLUAD) 0.5  ML injection Inject into the muscle. 06/12/21   Judyann Munson, MD  Boris Lown Oil 1000 MG CAPS 1,000 mg. 08/18/13   [provider]  letrozole [facility] 2.5 MG tablet Take by mouth. 07/14/15   [provider]  LORazepam (ATIVAN) 0.5 MG tablet Take by mouth. 05/28/16   [provider]  meloxicam (MOBIC) 7.5 MG tablet Take 1 tablet (7.5 mg total) by mouth daily as needed. 02/19/23     mupirocin ointment (BACTROBAN) 2 % Apply topically. 06/30/16   [provider]  nitrofurantoin, macrocrystal-monohydrate, (MACROBID) 100 MG capsule Take 1 capsule po 2x a day x 1 week Orally as directed 7 days 02/13/22     nitrofurantoin, macrocrystal-monohydrate, (MACROBID) 100 MG capsule Take 1 capsule po 2x a day x 1  week Orally as directed 7 days 02/13/22     nitrofurantoin, macrocrystal-monohydrate, (MACROBID) 100 MG capsule Take 1 capsule by mouth twice daily for 7 days. 06/21/22     omeprazole (PRILOSEC) 40 MG capsule Take 1 capsule (40 mg total) by mouth daily. 07/26/17   Reed, Tiffany L, DO  omeprazole (PRILOSEC) 40 MG capsule Take 1 capsule (40 mg total) by mouth daily. 09/19/22     oxybutynin (DITROPAN-XL) 10 MG 24 hr tablet Take 1 tablet (10 mg total) by mouth every evening at bedtime. 07/04/20     predniSONE (DELTASONE) 10 MG tablet Take 4 tablets by mouth for 2 days, then 3 tablets by mouth for 2 days, then 2 tablets by mouth for 2 day, then 1 tablet by mouth for 2 days, then stop. 05/23/21     predniSONE (DELTASONE) 10 MG tablet 2 tabs po daily x 3 days and then 1 tab daily for 2 days Orally Once a day 5 days 03/02/22     Semaglutide-Weight Management (WEGOVY) 0.25 MG/0.5ML SOAJ inject 0.25mg  Subcutaneous weekly x 4wk, then increase to 0.5mg  30 days 01/18/22     Semaglutide-Weight Management (WEGOVY) 0.5 MG/0.5ML SOAJ inject 0.5mg  Subcutaneous weekly x 4wk, then increase to 1mg  30 days 01/18/22     Semaglutide-Weight Management (WEGOVY) 1.7 MG/0.75ML SOAJ inject 1.7mg  Subcutaneous weekly x 4wk, then increase to 2.4mg  30 days 01/18/22     Semaglutide-Weight Management (WEGOVY) 1.7 MG/0.75ML SOAJ Inject 1.7 mg into the skin once a week. 07/31/22     Semaglutide-Weight Management (WEGOVY) 1.7 MG/0.75ML SOAJ inject 1.7 mg Subcutaneous weekly 30 days 09/06/22     Semaglutide-Weight Management (WEGOVY) 1.7 MG/0.75ML SOAJ Inject 1.7 mg into the skin once a week. 11/07/22     Semaglutide-Weight Management (WEGOVY) 2.4 MG/0.75ML SOAJ Inject 2.4 mg into the skin once a week. 06/05/22     Semaglutide-Weight Management (WEGOVY) 2.4 MG/0.75ML SOAJ Inject 2.4 mg into the skin once a week as directed 09/19/22     solifenacin (VESICARE) 5 MG tablet Take 5 mg by mouth daily.    [provider]  traMADol (ULTRAM) 50 MG  tablet Take by mouth.    [provider]  traMADol (ULTRAM) 50 MG tablet Take 1 tablet by mouth every 6 hours as needed for 5 days, for pain. 01/09/23     traZODone (DESYREL) 100 MG tablet Take 1 tablet (100 mg total) by mouth at bedtime. 06/21/22     traZODone (DESYREL) 100 MG tablet Take 1 tablet (100 mg total) by mouth at bedtime. 09/19/22     traZODone (DESYREL) 50 MG tablet Take 1 tablet (50 mg total) by mouth at bedtime. 06/07/22     triamcinolone cream (KENALOG) 0.1 % Apply  1 application on the skin twice a day Apply thin layer to affected areas twice a day x 1-3 weeks as needed. 03/25/23     triamcinolone ointment (KENALOG) 0.1 % Apply topically. 07/02/16   [provider]  valACYclovir (VALTREX) 1000 MG tablet Take 2 tablets by mouth in the morning and 2 tablets by mouth in the evening at first sign of fever blister. 08/15/20     valACYclovir (VALTREX) 1000 MG tablet Take 2 tablets (2,000 mg total) by mouth 2 (two) times daily. 05/08/22     zolpidem (AMBIEN) 10 MG tablet Take 1 tablet (10 mg total) by mouth at bedtime as needed for sleep. 08/09/17   Reed, Tiffany L, DO  zolpidem (AMBIEN) 10 MG tablet Take 1 tablet by mouth Once a day As needed 03/23/22     zolpidem (AMBIEN) 5 MG tablet Take 1.5 tablets (7.5 mg total) by mouth at bedtime as needed. 03/01/23         Allergies    Sporanos [itraconazole] and Sulfa antibiotics    Review of Systems   Review of Systems  Physical Exam Updated Vital Signs BP 135/77   Pulse 81   Temp 98.5 F (36.9 C)   Resp 15   Ht 5\' 4"  (1.626 m)   Wt 65.8 kg   SpO2 98%   BMI 24.89 kg/m  Physical Exam Vitals and nursing note reviewed.  HENT:     Head: Normocephalic.     Mouth/Throat:     Mouth: Mucous membranes are moist.  Eyes:     Conjunctiva/sclera: Conjunctivae normal.  Cardiovascular:     Rate and Rhythm: Normal rate.  Pulmonary:     Effort: Pulmonary effort is normal.  Musculoskeletal:     Comments: Right hand with  bruising and some ecchymosis over the pointer finger proximal joint.  Some tenderness to palpation.  Range of motion decreased secondary to pain.  No obvious deformities.  Brisk cap refill.  Soft compartments.  Able to make fist, okay sign, good apposition of fingers.  Somewhat able to cross fingers over.  Skin:    General: Skin is warm and dry.     Capillary Refill: Capillary refill takes less than 2 seconds.  Neurological:     Mental Status: She is alert and oriented to person, place, and time.  Psychiatric:        Mood and Affect: Mood normal.        Behavior: Behavior normal.     ED Results / Procedures / Treatments   Labs (all labs ordered are listed, but only abnormal results are displayed) Labs Reviewed - No data to display  EKG None  Radiology DG Hand Complete Right  Result Date: 04/09/2023 CLINICAL DATA:  Fall.  Pain. EXAM: RIGHT HAND - COMPLETE 3+ VIEW COMPARISON:  None Available. FINDINGS: No evidence for an acute fracture. No subluxation or dislocation. Short segment of the ring finger proximal phalanx is obscured by a ring. IMPRESSION: Negative. Electronically Signed   By: Kennith Center M.D.   On: 04/09/2023 06:26    Procedures Procedures    Medications Ordered in ED Medications  acetaminophen (TYLENOL) tablet 1,000 mg (1,000 mg Oral Given 04/09/23 0745)    ED Course/ Medical Decision Making/ A&P                                 Medical Decision Making Well-appearing 82 year old female presenting emergency department after jamming  her finger.  She has some swelling and ecchymosis to the right finger, but exam otherwise reassuring.  X-ray read as negative.  Independently reviewed by myself and agree with findings no acute fractures or dislocations in the area of maximal tenderness.  She has no anatomic snuffbox tenderness.  Suspect sprain.  No other injuries identified on exam.  Did not hit her head no LOC.  No indication for CT head.  Will give Tylenol and buddy  finger.  Patient to follow-up with PCP.  Stable for discharge.  Amount and/or Complexity of Data Reviewed Radiology: ordered.  Risk OTC drugs.          Final Clinical Impression(s) / ED Diagnoses Final diagnoses:  Jammed interphalangeal joint of finger of right hand, initial encounter    Rx / DC Orders ED Discharge Orders     None         Coral Spikes, DO 04/09/23 1528

## 2023-04-10 DIAGNOSIS — M79641 Pain in right hand: Secondary | ICD-10-CM | POA: Diagnosis not present

## 2023-04-16 ENCOUNTER — Other Ambulatory Visit (HOSPITAL_BASED_OUTPATIENT_CLINIC_OR_DEPARTMENT_OTHER): Payer: Self-pay

## 2023-04-17 ENCOUNTER — Telehealth: Payer: Self-pay | Admitting: *Deleted

## 2023-04-17 NOTE — Telephone Encounter (Signed)
Transition Care Management Follow-up Telephone Call Date of discharge and from where: [facility]  04/09/2023 How have you been since you were released from the hospital? Feeling well a little sore  Any questions or concerns? No Doing well no needs for transportation or medications  Items Reviewed: Did the pt receive and understand the discharge instructions provided? Yes  Medications obtained and verified? No  Other? No  Any new allergies since your discharge? No  Dietary orders reviewed? No Do you have support at home? Yes      Follow up appointments reviewed:  PCP Hospital f/u appt confirmed? Yes  Talked with Pcp personally and does not feel a visit is needed at this time  Are transportation arrangements needed? No  If their condition worsens, is the pt aware to call PCP or go to the Emergency Dept.? Yes Was the patient provided with contact information for the PCP's office or ED? Yes Was to pt encouraged to call back with questions or concerns? Yes

## 2023-04-24 ENCOUNTER — Other Ambulatory Visit (HOSPITAL_BASED_OUTPATIENT_CLINIC_OR_DEPARTMENT_OTHER): Payer: Self-pay

## 2023-04-24 MED ORDER — VALACYCLOVIR HCL 1 G PO TABS
1000.0000 mg | ORAL_TABLET | Freq: Two times a day (BID) | ORAL | 0 refills | Status: DC
  Filled 2023-04-24: qty 30, 15d supply, fill #0

## 2023-05-01 ENCOUNTER — Other Ambulatory Visit (HOSPITAL_BASED_OUTPATIENT_CLINIC_OR_DEPARTMENT_OTHER): Payer: Self-pay

## 2023-05-02 ENCOUNTER — Other Ambulatory Visit (HOSPITAL_BASED_OUTPATIENT_CLINIC_OR_DEPARTMENT_OTHER): Payer: Self-pay

## 2023-05-07 ENCOUNTER — Other Ambulatory Visit (HOSPITAL_BASED_OUTPATIENT_CLINIC_OR_DEPARTMENT_OTHER): Payer: Self-pay

## 2023-05-07 DIAGNOSIS — M79671 Pain in right foot: Secondary | ICD-10-CM | POA: Diagnosis not present

## 2023-05-07 MED ORDER — TRAMADOL HCL 50 MG PO TABS
50.0000 mg | ORAL_TABLET | Freq: Four times a day (QID) | ORAL | 0 refills | Status: DC | PRN
  Filled 2023-05-07: qty 20, 5d supply, fill #0

## 2023-05-07 MED ORDER — GABAPENTIN 100 MG PO CAPS
100.0000 mg | ORAL_CAPSULE | Freq: Three times a day (TID) | ORAL | 0 refills | Status: DC | PRN
  Filled 2023-05-07: qty 90, 30d supply, fill #0

## 2023-05-08 ENCOUNTER — Other Ambulatory Visit (HOSPITAL_BASED_OUTPATIENT_CLINIC_OR_DEPARTMENT_OTHER): Payer: Self-pay

## 2023-05-23 DIAGNOSIS — H903 Sensorineural hearing loss, bilateral: Secondary | ICD-10-CM | POA: Diagnosis not present

## 2023-05-30 DIAGNOSIS — M79641 Pain in right hand: Secondary | ICD-10-CM | POA: Diagnosis not present

## 2023-06-03 ENCOUNTER — Other Ambulatory Visit (HOSPITAL_BASED_OUTPATIENT_CLINIC_OR_DEPARTMENT_OTHER): Payer: Self-pay

## 2023-06-03 DIAGNOSIS — Z86718 Personal history of other venous thrombosis and embolism: Secondary | ICD-10-CM | POA: Diagnosis not present

## 2023-06-03 DIAGNOSIS — M8580 Other specified disorders of bone density and structure, unspecified site: Secondary | ICD-10-CM | POA: Diagnosis not present

## 2023-06-03 DIAGNOSIS — R82998 Other abnormal findings in urine: Secondary | ICD-10-CM | POA: Diagnosis not present

## 2023-06-03 DIAGNOSIS — K219 Gastro-esophageal reflux disease without esophagitis: Secondary | ICD-10-CM | POA: Diagnosis not present

## 2023-06-03 DIAGNOSIS — G4700 Insomnia, unspecified: Secondary | ICD-10-CM | POA: Diagnosis not present

## 2023-06-03 DIAGNOSIS — M1990 Unspecified osteoarthritis, unspecified site: Secondary | ICD-10-CM | POA: Diagnosis not present

## 2023-06-03 DIAGNOSIS — F325 Major depressive disorder, single episode, in full remission: Secondary | ICD-10-CM | POA: Diagnosis not present

## 2023-06-03 DIAGNOSIS — Z0000 Encounter for general adult medical examination without abnormal findings: Secondary | ICD-10-CM | POA: Diagnosis not present

## 2023-06-03 DIAGNOSIS — Z853 Personal history of malignant neoplasm of breast: Secondary | ICD-10-CM | POA: Diagnosis not present

## 2023-06-03 DIAGNOSIS — Z1331 Encounter for screening for depression: Secondary | ICD-10-CM | POA: Diagnosis not present

## 2023-06-03 DIAGNOSIS — Z23 Encounter for immunization: Secondary | ICD-10-CM | POA: Diagnosis not present

## 2023-06-03 DIAGNOSIS — E785 Hyperlipidemia, unspecified: Secondary | ICD-10-CM | POA: Diagnosis not present

## 2023-06-03 DIAGNOSIS — R3915 Urgency of urination: Secondary | ICD-10-CM | POA: Diagnosis not present

## 2023-06-03 DIAGNOSIS — Z1339 Encounter for screening examination for other mental health and behavioral disorders: Secondary | ICD-10-CM | POA: Diagnosis not present

## 2023-06-03 MED ORDER — ZOLPIDEM TARTRATE 5 MG PO TABS
5.0000 mg | ORAL_TABLET | Freq: Every evening | ORAL | 1 refills | Status: DC | PRN
Start: 2023-06-03 — End: 2023-10-03
  Filled 2023-06-03: qty 30, 30d supply, fill #0

## 2023-06-21 ENCOUNTER — Other Ambulatory Visit (HOSPITAL_BASED_OUTPATIENT_CLINIC_OR_DEPARTMENT_OTHER): Payer: Self-pay

## 2023-06-21 MED ORDER — RSVPREF3 VAC RECOMB ADJUVANTED 120 MCG/0.5ML IM SUSR
0.5000 mL | Freq: Once | INTRAMUSCULAR | 0 refills | Status: AC
  Filled 2023-06-21: qty 0.5, 1d supply, fill #0

## 2023-07-09 DIAGNOSIS — R0781 Pleurodynia: Secondary | ICD-10-CM | POA: Diagnosis not present

## 2023-07-11 ENCOUNTER — Other Ambulatory Visit: Payer: Self-pay

## 2023-07-15 ENCOUNTER — Other Ambulatory Visit (HOSPITAL_BASED_OUTPATIENT_CLINIC_OR_DEPARTMENT_OTHER): Payer: Self-pay

## 2023-07-15 ENCOUNTER — Encounter (HOSPITAL_BASED_OUTPATIENT_CLINIC_OR_DEPARTMENT_OTHER): Payer: Self-pay

## 2023-07-15 MED ORDER — ZEPBOUND 10 MG/0.5ML ~~LOC~~ SOAJ
10.0000 mg | SUBCUTANEOUS | 3 refills | Status: DC
Start: 2023-07-15 — End: 2024-03-11
  Filled 2023-07-15 (×2): qty 2, 28d supply, fill #0
  Filled 2023-08-07: qty 2, 28d supply, fill #1

## 2023-07-16 ENCOUNTER — Other Ambulatory Visit (HOSPITAL_BASED_OUTPATIENT_CLINIC_OR_DEPARTMENT_OTHER): Payer: Self-pay

## 2023-07-18 DIAGNOSIS — M25512 Pain in left shoulder: Secondary | ICD-10-CM | POA: Diagnosis not present

## 2023-07-22 ENCOUNTER — Other Ambulatory Visit (HOSPITAL_BASED_OUTPATIENT_CLINIC_OR_DEPARTMENT_OTHER): Payer: Self-pay

## 2023-07-22 MED ORDER — ZOLPIDEM TARTRATE 5 MG PO TABS
5.0000 mg | ORAL_TABLET | Freq: Every evening | ORAL | 1 refills | Status: DC | PRN
Start: 2023-07-22 — End: 2023-10-03
  Filled 2023-07-22: qty 45, 30d supply, fill #0

## 2023-07-23 ENCOUNTER — Other Ambulatory Visit (HOSPITAL_BASED_OUTPATIENT_CLINIC_OR_DEPARTMENT_OTHER): Payer: Self-pay

## 2023-07-23 MED ORDER — BUPROPION HCL ER (XL) 300 MG PO TB24
300.0000 mg | ORAL_TABLET | Freq: Every morning | ORAL | 2 refills | Status: DC
  Filled 2023-07-23: qty 90, 90d supply, fill #0
  Filled 2023-10-30: qty 90, 90d supply, fill #1
  Filled 2024-01-27: qty 90, 90d supply, fill #2

## 2023-07-31 ENCOUNTER — Other Ambulatory Visit (HOSPITAL_BASED_OUTPATIENT_CLINIC_OR_DEPARTMENT_OTHER): Payer: Self-pay

## 2023-07-31 DIAGNOSIS — M19071 Primary osteoarthritis, right ankle and foot: Secondary | ICD-10-CM | POA: Diagnosis not present

## 2023-07-31 DIAGNOSIS — G5761 Lesion of plantar nerve, right lower limb: Secondary | ICD-10-CM | POA: Diagnosis not present

## 2023-07-31 MED ORDER — TRAMADOL HCL 50 MG PO TABS
50.0000 mg | ORAL_TABLET | Freq: Four times a day (QID) | ORAL | 0 refills | Status: DC | PRN
  Filled 2023-07-31: qty 20, 5d supply, fill #0

## 2023-08-01 ENCOUNTER — Other Ambulatory Visit (HOSPITAL_BASED_OUTPATIENT_CLINIC_OR_DEPARTMENT_OTHER): Payer: Self-pay

## 2023-08-07 ENCOUNTER — Other Ambulatory Visit (HOSPITAL_BASED_OUTPATIENT_CLINIC_OR_DEPARTMENT_OTHER): Payer: Self-pay

## 2023-08-09 ENCOUNTER — Other Ambulatory Visit (HOSPITAL_BASED_OUTPATIENT_CLINIC_OR_DEPARTMENT_OTHER): Payer: Self-pay

## 2023-08-09 DIAGNOSIS — H16041 Marginal corneal ulcer, right eye: Secondary | ICD-10-CM | POA: Diagnosis not present

## 2023-08-09 MED ORDER — MOXIFLOXACIN HCL 0.5 % OP SOLN
1.0000 [drp] | OPHTHALMIC | 2 refills | Status: DC | PRN
  Filled 2023-08-09: qty 3, 15d supply, fill #0

## 2023-08-12 DIAGNOSIS — H16041 Marginal corneal ulcer, right eye: Secondary | ICD-10-CM | POA: Diagnosis not present

## 2023-08-14 ENCOUNTER — Other Ambulatory Visit (HOSPITAL_BASED_OUTPATIENT_CLINIC_OR_DEPARTMENT_OTHER): Payer: Self-pay

## 2023-08-14 DIAGNOSIS — G5761 Lesion of plantar nerve, right lower limb: Secondary | ICD-10-CM | POA: Diagnosis not present

## 2023-08-14 DIAGNOSIS — M79671 Pain in right foot: Secondary | ICD-10-CM | POA: Diagnosis not present

## 2023-08-14 MED ORDER — CELECOXIB 100 MG PO CAPS
100.0000 mg | ORAL_CAPSULE | Freq: Two times a day (BID) | ORAL | 2 refills | Status: DC
  Filled 2023-08-14: qty 60, 30d supply, fill #0

## 2023-08-14 MED ORDER — TRAMADOL HCL 50 MG PO TABS
50.0000 mg | ORAL_TABLET | Freq: Four times a day (QID) | ORAL | 0 refills | Status: DC | PRN
  Filled 2023-08-14: qty 20, 5d supply, fill #0

## 2023-09-04 DIAGNOSIS — E663 Overweight: Secondary | ICD-10-CM | POA: Diagnosis not present

## 2023-09-04 DIAGNOSIS — K760 Fatty (change of) liver, not elsewhere classified: Secondary | ICD-10-CM | POA: Diagnosis not present

## 2023-09-04 DIAGNOSIS — E785 Hyperlipidemia, unspecified: Secondary | ICD-10-CM | POA: Diagnosis not present

## 2023-10-03 ENCOUNTER — Ambulatory Visit (INDEPENDENT_AMBULATORY_CARE_PROVIDER_SITE_OTHER): Payer: Medicare Other

## 2023-10-03 ENCOUNTER — Other Ambulatory Visit (HOSPITAL_BASED_OUTPATIENT_CLINIC_OR_DEPARTMENT_OTHER): Payer: Self-pay

## 2023-10-03 ENCOUNTER — Ambulatory Visit (INDEPENDENT_AMBULATORY_CARE_PROVIDER_SITE_OTHER): Payer: Medicare Other | Admitting: Podiatry

## 2023-10-03 DIAGNOSIS — M79674 Pain in right toe(s): Secondary | ICD-10-CM

## 2023-10-03 DIAGNOSIS — B351 Tinea unguium: Secondary | ICD-10-CM

## 2023-10-03 DIAGNOSIS — M2041 Other hammer toe(s) (acquired), right foot: Secondary | ICD-10-CM

## 2023-10-03 MED ORDER — ZOLPIDEM TARTRATE 5 MG PO TABS
5.0000 mg | ORAL_TABLET | Freq: Every evening | ORAL | 1 refills | Status: DC | PRN
  Filled 2023-10-03: qty 45, 22d supply, fill #0
  Filled 2024-01-27: qty 45, 22d supply, fill #1

## 2023-10-04 ENCOUNTER — Other Ambulatory Visit (HOSPITAL_BASED_OUTPATIENT_CLINIC_OR_DEPARTMENT_OTHER): Payer: Self-pay

## 2023-10-04 MED ORDER — OMEPRAZOLE 40 MG PO CPDR
40.0000 mg | DELAYED_RELEASE_CAPSULE | Freq: Every day | ORAL | 3 refills | Status: DC
  Filled 2023-10-04: qty 90, 90d supply, fill #0
  Filled 2024-01-08: qty 90, 90d supply, fill #1
  Filled 2024-04-10: qty 90, 90d supply, fill #2
  Filled 2024-07-09: qty 90, 90d supply, fill #3

## 2023-10-04 NOTE — Progress Notes (Signed)
 Subjective:   Patient ID: Jillian Norton, female   DOB: 83 y.o.   MRN: [id number]   HPI Patient states she is having a lot of problems with the fourth toe on the right foot and also with her big toe she has developed a softness and has lost half of the nail that she is concerned about.  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  At this point I did discuss derotational distal arthroplasty digit 4 right and patient wants to do this but cannot do it yet.  I gave her information on surgery and she will schedule to have it done when the schedule works for her and currently for the nail organ to leave it alone watch it and decide what might be appropriate  X-rays indicate significant distal rotation digit for right foot at the joint surface

## 2023-11-04 ENCOUNTER — Other Ambulatory Visit: Payer: Self-pay | Admitting: Internal Medicine

## 2023-11-04 DIAGNOSIS — L821 Other seborrheic keratosis: Secondary | ICD-10-CM | POA: Diagnosis not present

## 2023-11-04 DIAGNOSIS — D692 Other nonthrombocytopenic purpura: Secondary | ICD-10-CM | POA: Diagnosis not present

## 2023-11-04 DIAGNOSIS — Z0000 Encounter for general adult medical examination without abnormal findings: Secondary | ICD-10-CM

## 2023-11-04 DIAGNOSIS — L570 Actinic keratosis: Secondary | ICD-10-CM | POA: Diagnosis not present

## 2023-11-04 DIAGNOSIS — Z85820 Personal history of malignant melanoma of skin: Secondary | ICD-10-CM | POA: Diagnosis not present

## 2023-11-04 DIAGNOSIS — Z85828 Personal history of other malignant neoplasm of skin: Secondary | ICD-10-CM | POA: Diagnosis not present

## 2023-11-04 DIAGNOSIS — L814 Other melanin hyperpigmentation: Secondary | ICD-10-CM | POA: Diagnosis not present

## 2023-11-25 ENCOUNTER — Ambulatory Visit
Admission: RE | Admit: 2023-11-25 | Discharge: 2023-11-25 | Disposition: A | Discharge status: Home / Self Care | Source: Ambulatory Visit | Attending: Internal Medicine | Admitting: Internal Medicine

## 2023-11-25 DIAGNOSIS — Z0000 Encounter for general adult medical examination without abnormal findings: Secondary | ICD-10-CM

## 2023-11-25 DIAGNOSIS — Z1231 Encounter for screening mammogram for malignant neoplasm of breast: Secondary | ICD-10-CM | POA: Diagnosis not present

## 2023-12-04 ENCOUNTER — Other Ambulatory Visit (HOSPITAL_BASED_OUTPATIENT_CLINIC_OR_DEPARTMENT_OTHER): Payer: Self-pay

## 2023-12-04 DIAGNOSIS — E663 Overweight: Secondary | ICD-10-CM | POA: Diagnosis not present

## 2023-12-04 DIAGNOSIS — E785 Hyperlipidemia, unspecified: Secondary | ICD-10-CM | POA: Diagnosis not present

## 2023-12-04 MED ORDER — ZOLPIDEM TARTRATE 5 MG PO TABS
5.0000 mg | ORAL_TABLET | Freq: Every evening | ORAL | 0 refills | Status: DC
Start: 2023-12-04 — End: 2024-03-03
  Filled 2023-12-04: qty 45, 45d supply, fill #0

## 2023-12-30 DIAGNOSIS — D485 Neoplasm of uncertain behavior of skin: Secondary | ICD-10-CM | POA: Diagnosis not present

## 2023-12-30 DIAGNOSIS — Z961 Presence of intraocular lens: Secondary | ICD-10-CM | POA: Diagnosis not present

## 2023-12-30 DIAGNOSIS — H52203 Unspecified astigmatism, bilateral: Secondary | ICD-10-CM | POA: Diagnosis not present

## 2023-12-30 DIAGNOSIS — L570 Actinic keratosis: Secondary | ICD-10-CM | POA: Diagnosis not present

## 2023-12-30 DIAGNOSIS — H353131 Nonexudative age-related macular degeneration, bilateral, early dry stage: Secondary | ICD-10-CM | POA: Diagnosis not present

## 2023-12-30 DIAGNOSIS — Z85828 Personal history of other malignant neoplasm of skin: Secondary | ICD-10-CM | POA: Diagnosis not present

## 2023-12-30 DIAGNOSIS — Z85820 Personal history of malignant melanoma of skin: Secondary | ICD-10-CM | POA: Diagnosis not present

## 2024-01-08 ENCOUNTER — Other Ambulatory Visit (HOSPITAL_BASED_OUTPATIENT_CLINIC_OR_DEPARTMENT_OTHER): Payer: Self-pay

## 2024-01-27 ENCOUNTER — Other Ambulatory Visit (HOSPITAL_BASED_OUTPATIENT_CLINIC_OR_DEPARTMENT_OTHER): Payer: Self-pay

## 2024-03-02 ENCOUNTER — Encounter (HOSPITAL_BASED_OUTPATIENT_CLINIC_OR_DEPARTMENT_OTHER): Payer: Self-pay

## 2024-03-02 ENCOUNTER — Other Ambulatory Visit: Payer: Self-pay

## 2024-03-02 DIAGNOSIS — Z7985 Long-term (current) use of injectable non-insulin antidiabetic drugs: Secondary | ICD-10-CM | POA: Diagnosis not present

## 2024-03-02 DIAGNOSIS — R059 Cough, unspecified: Secondary | ICD-10-CM | POA: Diagnosis not present

## 2024-03-02 DIAGNOSIS — R109 Unspecified abdominal pain: Secondary | ICD-10-CM | POA: Diagnosis not present

## 2024-03-02 DIAGNOSIS — N390 Urinary tract infection, site not specified: Secondary | ICD-10-CM | POA: Diagnosis not present

## 2024-03-02 DIAGNOSIS — K4030 Unilateral inguinal hernia, with obstruction, without gangrene, not specified as recurrent: Secondary | ICD-10-CM | POA: Diagnosis not present

## 2024-03-02 DIAGNOSIS — Z801 Family history of malignant neoplasm of trachea, bronchus and lung: Secondary | ICD-10-CM | POA: Diagnosis not present

## 2024-03-02 DIAGNOSIS — E079 Disorder of thyroid, unspecified: Secondary | ICD-10-CM | POA: Diagnosis not present

## 2024-03-02 DIAGNOSIS — K0889 Other specified disorders of teeth and supporting structures: Secondary | ICD-10-CM | POA: Diagnosis not present

## 2024-03-02 DIAGNOSIS — R1110 Vomiting, unspecified: Secondary | ICD-10-CM | POA: Diagnosis not present

## 2024-03-02 DIAGNOSIS — Z961 Presence of intraocular lens: Secondary | ICD-10-CM | POA: Diagnosis not present

## 2024-03-02 DIAGNOSIS — R310 Gross hematuria: Secondary | ICD-10-CM | POA: Diagnosis not present

## 2024-03-02 DIAGNOSIS — Y838 Other surgical procedures as the cause of abnormal reaction of the patient, or of later complication, without mention of misadventure at the time of the procedure: Secondary | ICD-10-CM | POA: Diagnosis not present

## 2024-03-02 DIAGNOSIS — I10 Essential (primary) hypertension: Secondary | ICD-10-CM | POA: Diagnosis not present

## 2024-03-02 DIAGNOSIS — Z9049 Acquired absence of other specified parts of digestive tract: Secondary | ICD-10-CM

## 2024-03-02 DIAGNOSIS — M1812 Unilateral primary osteoarthritis of first carpometacarpal joint, left hand: Secondary | ICD-10-CM | POA: Diagnosis not present

## 2024-03-02 DIAGNOSIS — R339 Retention of urine, unspecified: Secondary | ICD-10-CM | POA: Diagnosis not present

## 2024-03-02 DIAGNOSIS — M8580 Other specified disorders of bone density and structure, unspecified site: Secondary | ICD-10-CM | POA: Diagnosis not present

## 2024-03-02 DIAGNOSIS — K4130 Unilateral femoral hernia, with obstruction, without gangrene, not specified as recurrent: Secondary | ICD-10-CM | POA: Diagnosis not present

## 2024-03-02 DIAGNOSIS — K567 Ileus, unspecified: Secondary | ICD-10-CM | POA: Diagnosis not present

## 2024-03-02 DIAGNOSIS — R102 Pelvic and perineal pain: Secondary | ICD-10-CM | POA: Diagnosis not present

## 2024-03-02 DIAGNOSIS — Y92239 Unspecified place in hospital as the place of occurrence of the external cause: Secondary | ICD-10-CM | POA: Diagnosis not present

## 2024-03-02 DIAGNOSIS — Z853 Personal history of malignant neoplasm of breast: Secondary | ICD-10-CM

## 2024-03-02 DIAGNOSIS — Z86718 Personal history of other venous thrombosis and embolism: Secondary | ICD-10-CM

## 2024-03-02 DIAGNOSIS — Z883 Allergy status to other anti-infective agents status: Secondary | ICD-10-CM

## 2024-03-02 DIAGNOSIS — K5530 Necrotizing enterocolitis, unspecified: Secondary | ICD-10-CM | POA: Diagnosis not present

## 2024-03-02 DIAGNOSIS — Z9889 Other specified postprocedural states: Secondary | ICD-10-CM

## 2024-03-02 DIAGNOSIS — R509 Fever, unspecified: Secondary | ICD-10-CM | POA: Diagnosis not present

## 2024-03-02 DIAGNOSIS — Z882 Allergy status to sulfonamides status: Secondary | ICD-10-CM

## 2024-03-02 DIAGNOSIS — Z743 Need for continuous supervision: Secondary | ICD-10-CM | POA: Diagnosis not present

## 2024-03-02 DIAGNOSIS — R1084 Generalized abdominal pain: Secondary | ICD-10-CM | POA: Diagnosis not present

## 2024-03-02 DIAGNOSIS — F419 Anxiety disorder, unspecified: Secondary | ICD-10-CM | POA: Diagnosis present

## 2024-03-02 DIAGNOSIS — K4140 Unilateral femoral hernia, with gangrene, not specified as recurrent: Secondary | ICD-10-CM | POA: Diagnosis not present

## 2024-03-02 DIAGNOSIS — Z79899 Other long term (current) drug therapy: Secondary | ICD-10-CM

## 2024-03-02 DIAGNOSIS — F5101 Primary insomnia: Secondary | ICD-10-CM | POA: Diagnosis present

## 2024-03-02 DIAGNOSIS — Z888 Allergy status to other drugs, medicaments and biological substances status: Secondary | ICD-10-CM

## 2024-03-02 DIAGNOSIS — R1900 Intra-abdominal and pelvic swelling, mass and lump, unspecified site: Secondary | ICD-10-CM | POA: Diagnosis not present

## 2024-03-02 DIAGNOSIS — N179 Acute kidney failure, unspecified: Secondary | ICD-10-CM | POA: Diagnosis not present

## 2024-03-02 DIAGNOSIS — R112 Nausea with vomiting, unspecified: Secondary | ICD-10-CM | POA: Diagnosis not present

## 2024-03-02 DIAGNOSIS — K4090 Unilateral inguinal hernia, without obstruction or gangrene, not specified as recurrent: Secondary | ICD-10-CM | POA: Diagnosis not present

## 2024-03-02 DIAGNOSIS — R140 Abdominal distension (gaseous): Secondary | ICD-10-CM | POA: Diagnosis not present

## 2024-03-02 DIAGNOSIS — Z800 Family history of malignant neoplasm of digestive organs: Secondary | ICD-10-CM | POA: Diagnosis not present

## 2024-03-02 DIAGNOSIS — N3941 Urge incontinence: Secondary | ICD-10-CM | POA: Diagnosis present

## 2024-03-02 DIAGNOSIS — E785 Hyperlipidemia, unspecified: Secondary | ICD-10-CM | POA: Diagnosis not present

## 2024-03-02 DIAGNOSIS — K55029 Acute infarction of small intestine, extent unspecified: Secondary | ICD-10-CM | POA: Diagnosis not present

## 2024-03-02 DIAGNOSIS — K922 Gastrointestinal hemorrhage, unspecified: Secondary | ICD-10-CM | POA: Diagnosis not present

## 2024-03-02 DIAGNOSIS — N750 Cyst of Bartholin's gland: Secondary | ICD-10-CM | POA: Diagnosis not present

## 2024-03-02 DIAGNOSIS — Z9842 Cataract extraction status, left eye: Secondary | ICD-10-CM

## 2024-03-02 DIAGNOSIS — L7634 Postprocedural seroma of skin and subcutaneous tissue following other procedure: Secondary | ICD-10-CM | POA: Diagnosis not present

## 2024-03-02 DIAGNOSIS — Z860100 Personal history of colon polyps, unspecified: Secondary | ICD-10-CM

## 2024-03-02 DIAGNOSIS — Z66 Do not resuscitate: Secondary | ICD-10-CM | POA: Diagnosis present

## 2024-03-02 DIAGNOSIS — Z923 Personal history of irradiation: Secondary | ICD-10-CM | POA: Diagnosis not present

## 2024-03-02 DIAGNOSIS — Z87891 Personal history of nicotine dependence: Secondary | ICD-10-CM

## 2024-03-02 DIAGNOSIS — R1032 Left lower quadrant pain: Secondary | ICD-10-CM | POA: Diagnosis not present

## 2024-03-02 DIAGNOSIS — N3281 Overactive bladder: Secondary | ICD-10-CM | POA: Diagnosis not present

## 2024-03-02 DIAGNOSIS — Z90710 Acquired absence of both cervix and uterus: Secondary | ICD-10-CM

## 2024-03-02 DIAGNOSIS — Z87892 Personal history of anaphylaxis: Secondary | ICD-10-CM

## 2024-03-02 DIAGNOSIS — K219 Gastro-esophageal reflux disease without esophagitis: Secondary | ICD-10-CM | POA: Diagnosis present

## 2024-03-02 DIAGNOSIS — K5989 Other specified functional intestinal disorders: Secondary | ICD-10-CM | POA: Diagnosis not present

## 2024-03-02 DIAGNOSIS — R1030 Lower abdominal pain, unspecified: Secondary | ICD-10-CM | POA: Diagnosis not present

## 2024-03-02 DIAGNOSIS — Z9841 Cataract extraction status, right eye: Secondary | ICD-10-CM

## 2024-03-02 LAB — CBC
HCT: 39.2 % (ref 36.0–46.0)
Hemoglobin: 13 g/dL (ref 12.0–15.0)
MCH: 30.4 pg (ref 26.0–34.0)
MCHC: 33.2 g/dL (ref 30.0–36.0)
MCV: 91.8 fL (ref 80.0–100.0)
Platelets: 231 K/uL (ref 150–400)
RBC: 4.27 MIL/uL (ref 3.87–5.11)
RDW: 13.5 % (ref 11.5–15.5)
WBC: 7.4 K/uL (ref 4.0–10.5)
nRBC: 0 % (ref 0.0–0.2)

## 2024-03-02 LAB — COMPREHENSIVE METABOLIC PANEL WITH GFR
ALT: 12 U/L (ref 0–44)
AST: 14 U/L — ABNORMAL LOW (ref 15–41)
Albumin: 4.4 g/dL (ref 3.5–5.0)
Alkaline Phosphatase: 59 U/L (ref 38–126)
Anion gap: 14 (ref 5–15)
BUN: 15 mg/dL (ref 8–23)
CO2: 22 mmol/L (ref 22–32)
Calcium: 9.9 mg/dL (ref 8.9–10.3)
Chloride: 102 mmol/L (ref 98–111)
Creatinine, Ser: 0.91 mg/dL (ref 0.44–1.00)
GFR, Estimated: >60 mL/min (ref >60)
Glucose, Bld: 133 mg/dL — ABNORMAL HIGH (ref 70–99)
Potassium: 4.1 mmol/L (ref 3.5–5.1)
Sodium: 138 mmol/L (ref 135–145)
Total Bilirubin: 0.4 mg/dL (ref 0.0–1.2)
Total Protein: 7.4 g/dL (ref 6.5–8.1)

## 2024-03-02 LAB — LIPASE, BLOOD: Lipase: 21 U/L (ref 11–51)

## 2024-03-02 NOTE — ED Triage Notes (Signed)
 Pt presents via EMS c/o lower abd/pelvic pain and difficulty urinating. Reports N/V per EMS.

## 2024-03-03 ENCOUNTER — Emergency Department (HOSPITAL_COMMUNITY): Admitting: Anesthesiology

## 2024-03-03 ENCOUNTER — Emergency Department (HOSPITAL_BASED_OUTPATIENT_CLINIC_OR_DEPARTMENT_OTHER)

## 2024-03-03 ENCOUNTER — Encounter (HOSPITAL_COMMUNITY): Admission: EM | Disposition: A | Discharge status: Home / Self Care | Payer: Self-pay | Source: Home / Self Care

## 2024-03-03 ENCOUNTER — Encounter (HOSPITAL_COMMUNITY): Payer: Self-pay | Admitting: Certified Registered Nurse Anesthetist

## 2024-03-03 ENCOUNTER — Inpatient Hospital Stay (HOSPITAL_BASED_OUTPATIENT_CLINIC_OR_DEPARTMENT_OTHER)
Admission: EM | Admit: 2024-03-03 | Discharge: 2024-03-10 | DRG: 330 | Disposition: A | Discharge status: Home / Self Care | Attending: General Surgery | Admitting: General Surgery

## 2024-03-03 DIAGNOSIS — K4130 Unilateral femoral hernia, with obstruction, without gangrene, not specified as recurrent: Secondary | ICD-10-CM | POA: Diagnosis not present

## 2024-03-03 DIAGNOSIS — K4090 Unilateral inguinal hernia, without obstruction or gangrene, not specified as recurrent: Secondary | ICD-10-CM | POA: Diagnosis not present

## 2024-03-03 DIAGNOSIS — Y92239 Unspecified place in hospital as the place of occurrence of the external cause: Secondary | ICD-10-CM | POA: Diagnosis not present

## 2024-03-03 DIAGNOSIS — R1032 Left lower quadrant pain: Secondary | ICD-10-CM | POA: Diagnosis not present

## 2024-03-03 DIAGNOSIS — K567 Ileus, unspecified: Secondary | ICD-10-CM | POA: Diagnosis not present

## 2024-03-03 DIAGNOSIS — M1812 Unilateral primary osteoarthritis of first carpometacarpal joint, left hand: Secondary | ICD-10-CM | POA: Diagnosis present

## 2024-03-03 DIAGNOSIS — R059 Cough, unspecified: Secondary | ICD-10-CM | POA: Diagnosis present

## 2024-03-03 DIAGNOSIS — F5101 Primary insomnia: Secondary | ICD-10-CM | POA: Diagnosis present

## 2024-03-03 DIAGNOSIS — Z7985 Long-term (current) use of injectable non-insulin antidiabetic drugs: Secondary | ICD-10-CM | POA: Diagnosis not present

## 2024-03-03 DIAGNOSIS — R140 Abdominal distension (gaseous): Secondary | ICD-10-CM | POA: Diagnosis not present

## 2024-03-03 DIAGNOSIS — R102 Pelvic and perineal pain: Secondary | ICD-10-CM | POA: Diagnosis not present

## 2024-03-03 DIAGNOSIS — K0889 Other specified disorders of teeth and supporting structures: Secondary | ICD-10-CM | POA: Diagnosis present

## 2024-03-03 DIAGNOSIS — K55029 Acute infarction of small intestine, extent unspecified: Secondary | ICD-10-CM | POA: Diagnosis not present

## 2024-03-03 DIAGNOSIS — Z743 Need for continuous supervision: Secondary | ICD-10-CM | POA: Diagnosis not present

## 2024-03-03 DIAGNOSIS — L7634 Postprocedural seroma of skin and subcutaneous tissue following other procedure: Secondary | ICD-10-CM | POA: Diagnosis not present

## 2024-03-03 DIAGNOSIS — E079 Disorder of thyroid, unspecified: Secondary | ICD-10-CM | POA: Diagnosis present

## 2024-03-03 DIAGNOSIS — K4030 Unilateral inguinal hernia, with obstruction, without gangrene, not specified as recurrent: Principal | ICD-10-CM

## 2024-03-03 DIAGNOSIS — R1084 Generalized abdominal pain: Secondary | ICD-10-CM | POA: Diagnosis not present

## 2024-03-03 DIAGNOSIS — Y838 Other surgical procedures as the cause of abnormal reaction of the patient, or of later complication, without mention of misadventure at the time of the procedure: Secondary | ICD-10-CM | POA: Diagnosis not present

## 2024-03-03 DIAGNOSIS — R339 Retention of urine, unspecified: Secondary | ICD-10-CM | POA: Diagnosis not present

## 2024-03-03 DIAGNOSIS — N3281 Overactive bladder: Secondary | ICD-10-CM | POA: Diagnosis present

## 2024-03-03 DIAGNOSIS — N750 Cyst of Bartholin's gland: Secondary | ICD-10-CM | POA: Diagnosis present

## 2024-03-03 DIAGNOSIS — Z800 Family history of malignant neoplasm of digestive organs: Secondary | ICD-10-CM | POA: Diagnosis not present

## 2024-03-03 DIAGNOSIS — R1030 Lower abdominal pain, unspecified: Secondary | ICD-10-CM | POA: Diagnosis not present

## 2024-03-03 DIAGNOSIS — R109 Unspecified abdominal pain: Secondary | ICD-10-CM | POA: Diagnosis not present

## 2024-03-03 DIAGNOSIS — E785 Hyperlipidemia, unspecified: Secondary | ICD-10-CM | POA: Diagnosis present

## 2024-03-03 DIAGNOSIS — K4140 Unilateral femoral hernia, with gangrene, not specified as recurrent: Secondary | ICD-10-CM | POA: Diagnosis present

## 2024-03-03 DIAGNOSIS — N3941 Urge incontinence: Secondary | ICD-10-CM | POA: Diagnosis present

## 2024-03-03 DIAGNOSIS — Z801 Family history of malignant neoplasm of trachea, bronchus and lung: Secondary | ICD-10-CM | POA: Diagnosis not present

## 2024-03-03 DIAGNOSIS — F419 Anxiety disorder, unspecified: Secondary | ICD-10-CM | POA: Diagnosis present

## 2024-03-03 DIAGNOSIS — R310 Gross hematuria: Secondary | ICD-10-CM | POA: Diagnosis not present

## 2024-03-03 DIAGNOSIS — R1900 Intra-abdominal and pelvic swelling, mass and lump, unspecified site: Secondary | ICD-10-CM | POA: Diagnosis not present

## 2024-03-03 DIAGNOSIS — Z961 Presence of intraocular lens: Secondary | ICD-10-CM | POA: Diagnosis present

## 2024-03-03 DIAGNOSIS — K5530 Necrotizing enterocolitis, unspecified: Secondary | ICD-10-CM | POA: Diagnosis not present

## 2024-03-03 DIAGNOSIS — Z923 Personal history of irradiation: Secondary | ICD-10-CM | POA: Diagnosis not present

## 2024-03-03 DIAGNOSIS — K219 Gastro-esophageal reflux disease without esophagitis: Secondary | ICD-10-CM | POA: Diagnosis present

## 2024-03-03 DIAGNOSIS — Z66 Do not resuscitate: Secondary | ICD-10-CM | POA: Diagnosis present

## 2024-03-03 DIAGNOSIS — R1110 Vomiting, unspecified: Secondary | ICD-10-CM | POA: Diagnosis not present

## 2024-03-03 DIAGNOSIS — R112 Nausea with vomiting, unspecified: Secondary | ICD-10-CM | POA: Diagnosis not present

## 2024-03-03 DIAGNOSIS — N179 Acute kidney failure, unspecified: Secondary | ICD-10-CM | POA: Diagnosis not present

## 2024-03-03 DIAGNOSIS — N390 Urinary tract infection, site not specified: Secondary | ICD-10-CM | POA: Diagnosis not present

## 2024-03-03 DIAGNOSIS — M8580 Other specified disorders of bone density and structure, unspecified site: Secondary | ICD-10-CM | POA: Diagnosis present

## 2024-03-03 HISTORY — PX: BOWEL RESECTION: SHX1257

## 2024-03-03 HISTORY — PX: LAPAROSCOPIC ABDOMINAL EXPLORATION: SHX6249

## 2024-03-03 HISTORY — PX: INGUINAL HERNIA REPAIR: SHX194

## 2024-03-03 HISTORY — PX: INSERTION OF MESH: SHX5868

## 2024-03-03 LAB — URINALYSIS, ROUTINE W REFLEX MICROSCOPIC
Bilirubin Urine: NEGATIVE
Glucose, UA: NEGATIVE mg/dL
Hgb urine dipstick: NEGATIVE
Ketones, ur: 40 mg/dL — AB
Nitrite: NEGATIVE
Specific Gravity, Urine: >1.046 — ABNORMAL HIGH (ref 1.005–1.030)
pH: 6 (ref 5.0–8.0)

## 2024-03-03 LAB — TYPE AND SCREEN
ABO/RH(D): A NEG
Antibody Screen: NEGATIVE

## 2024-03-03 LAB — ABO/RH: ABO/RH(D): A NEG

## 2024-03-03 LAB — SURGICAL PCR SCREEN
MRSA, PCR: NEGATIVE
Staphylococcus aureus: NEGATIVE

## 2024-03-03 SURGERY — REPAIR, HERNIA, INGUINAL, ADULT
Anesthesia: General | Site: Groin

## 2024-03-03 MED ORDER — OXYCODONE HCL 5 MG/5ML PO SOLN: 5.0000 mg | Freq: Once | ORAL | Status: AC | PRN

## 2024-03-03 MED ORDER — FENTANYL CITRATE (PF) 100 MCG/2ML IJ SOLN
INTRAMUSCULAR | Status: AC
Start: 2024-03-03 — End: 2024-03-03
  Filled 2024-03-03: qty 2

## 2024-03-03 MED ORDER — ONDANSETRON HCL 4 MG/2ML IJ SOLN
INTRAMUSCULAR | Status: AC
  Filled 2024-03-03: qty 2

## 2024-03-03 MED ORDER — ONDANSETRON HCL 4 MG/2ML IJ SOLN
4.0000 mg | Freq: Four times a day (QID) | INTRAMUSCULAR | Status: DC | PRN
  Administered 2024-03-04 – 2024-03-05 (×2): 4 mg via INTRAVENOUS
  Filled 2024-03-03 (×2): qty 2

## 2024-03-03 MED ORDER — ONDANSETRON 4 MG PO TBDP: 4.0000 mg | ORAL_TABLET | Freq: Four times a day (QID) | ORAL | Status: DC | PRN

## 2024-03-03 MED ORDER — SUCCINYLCHOLINE CHLORIDE 200 MG/10ML IV SOSY
PREFILLED_SYRINGE | INTRAVENOUS | Status: AC
  Filled 2024-03-03: qty 10

## 2024-03-03 MED ORDER — IOHEXOL 300 MG/ML  SOLN
85.0000 mL | Freq: Once | INTRAMUSCULAR | Status: AC | PRN
  Administered 2024-03-03: 85 mL via INTRAVENOUS

## 2024-03-03 MED ORDER — BUPIVACAINE-EPINEPHRINE (PF) 0.25% -1:200000 IJ SOLN
INTRAMUSCULAR | Status: AC
  Filled 2024-03-03: qty 30

## 2024-03-03 MED ORDER — CHLORHEXIDINE GLUCONATE 0.12 % MT SOLN
OROMUCOSAL | Status: AC
  Administered 2024-03-03: 15 mL via OROMUCOSAL
  Filled 2024-03-03: qty 15

## 2024-03-03 MED ORDER — ONDANSETRON HCL 4 MG/2ML IJ SOLN
INTRAMUSCULAR | Status: DC | PRN
  Administered 2024-03-03: 4 mg via INTRAVENOUS

## 2024-03-03 MED ORDER — KCL IN DEXTROSE-NACL 20-5-0.45 MEQ/L-%-% IV SOLN
INTRAVENOUS | Status: AC
  Filled 2024-03-03 (×3): qty 1000

## 2024-03-03 MED ORDER — LACTATED RINGERS IV BOLUS
1000.0000 mL | Freq: Once | INTRAVENOUS | Status: AC
  Administered 2024-03-03: 1000 mL via INTRAVENOUS

## 2024-03-03 MED ORDER — BUPIVACAINE-EPINEPHRINE 0.25% -1:200000 IJ SOLN
INTRAMUSCULAR | Status: DC | PRN
  Administered 2024-03-03: 8 mL

## 2024-03-03 MED ORDER — LIDOCAINE 2% (20 MG/ML) 5 ML SYRINGE
INTRAMUSCULAR | Status: DC | PRN
  Administered 2024-03-03: 60 mg via INTRAVENOUS

## 2024-03-03 MED ORDER — PHENYLEPHRINE 80 MCG/ML (10ML) SYRINGE FOR IV PUSH (FOR BLOOD PRESSURE SUPPORT)
PREFILLED_SYRINGE | INTRAVENOUS | Status: AC
  Filled 2024-03-03: qty 10

## 2024-03-03 MED ORDER — MORPHINE SULFATE (PF) 4 MG/ML IV SOLN
4.0000 mg | Freq: Once | INTRAVENOUS | Status: AC
  Administered 2024-03-03: 4 mg via INTRAVENOUS
  Filled 2024-03-03: qty 1

## 2024-03-03 MED ORDER — CEFAZOLIN SODIUM-DEXTROSE 2-4 GM/100ML-% IV SOLN
2.0000 g | INTRAVENOUS | Status: AC
  Administered 2024-03-03: 2 g via INTRAVENOUS
  Filled 2024-03-03: qty 100

## 2024-03-03 MED ORDER — HYDROMORPHONE HCL 1 MG/ML IJ SOLN: 0.5000 mg | INTRAMUSCULAR | Status: DC | PRN

## 2024-03-03 MED ORDER — PROPOFOL 10 MG/ML IV BOLUS
INTRAVENOUS | Status: AC
  Filled 2024-03-03: qty 20

## 2024-03-03 MED ORDER — ROCURONIUM BROMIDE 10 MG/ML (PF) SYRINGE
PREFILLED_SYRINGE | INTRAVENOUS | Status: AC
  Filled 2024-03-03: qty 10

## 2024-03-03 MED ORDER — ACETAMINOPHEN 500 MG PO TABS
1000.0000 mg | ORAL_TABLET | Freq: Four times a day (QID) | ORAL | Status: DC
  Administered 2024-03-03 – 2024-03-10 (×22): 1000 mg via ORAL
  Filled 2024-03-03 (×24): qty 2

## 2024-03-03 MED ORDER — FENTANYL CITRATE (PF) 250 MCG/5ML IJ SOLN
INTRAMUSCULAR | Status: DC | PRN
  Administered 2024-03-03: 25 ug via INTRAVENOUS
  Administered 2024-03-03: 50 ug via INTRAVENOUS

## 2024-03-03 MED ORDER — PIPERACILLIN-TAZOBACTAM 3.375 G IVPB
3.3750 g | Freq: Three times a day (TID) | INTRAVENOUS | Status: AC
  Administered 2024-03-03 – 2024-03-04 (×3): 3.375 g via INTRAVENOUS
  Filled 2024-03-03 (×3): qty 50

## 2024-03-03 MED ORDER — LIDOCAINE 2% (20 MG/ML) 5 ML SYRINGE
INTRAMUSCULAR | Status: AC
  Filled 2024-03-03: qty 5

## 2024-03-03 MED ORDER — SODIUM CHLORIDE 0.9 % IV BOLUS
1000.0000 mL | Freq: Once | INTRAVENOUS | Status: AC
  Administered 2024-03-03: 1000 mL via INTRAVENOUS

## 2024-03-03 MED ORDER — ACETAMINOPHEN 500 MG PO TABS
1000.0000 mg | ORAL_TABLET | Freq: Once | ORAL | Status: AC
  Administered 2024-03-03: 1000 mg via ORAL
  Filled 2024-03-03: qty 2

## 2024-03-03 MED ORDER — 0.9 % SODIUM CHLORIDE (POUR BTL) OPTIME
TOPICAL | Status: DC | PRN
  Administered 2024-03-03: 1000 mL

## 2024-03-03 MED ORDER — HYDROMORPHONE HCL 1 MG/ML IJ SOLN
0.5000 mg | INTRAMUSCULAR | Status: DC | PRN
  Administered 2024-03-03: 0.5 mg via INTRAVENOUS
  Filled 2024-03-03: qty 1

## 2024-03-03 MED ORDER — PROPOFOL 1000 MG/100ML IV EMUL
INTRAVENOUS | Status: AC
  Filled 2024-03-03: qty 100

## 2024-03-03 MED ORDER — ORAL CARE MOUTH RINSE: 15.0000 mL | Freq: Once | OROMUCOSAL | Status: AC

## 2024-03-03 MED ORDER — FENTANYL CITRATE (PF) 250 MCG/5ML IJ SOLN
INTRAMUSCULAR | Status: AC
  Filled 2024-03-03: qty 5

## 2024-03-03 MED ORDER — PHENYLEPHRINE 80 MCG/ML (10ML) SYRINGE FOR IV PUSH (FOR BLOOD PRESSURE SUPPORT)
PREFILLED_SYRINGE | INTRAVENOUS | Status: DC | PRN
  Administered 2024-03-03 (×2): 80 ug via INTRAVENOUS

## 2024-03-03 MED ORDER — SUGAMMADEX SODIUM 200 MG/2ML IV SOLN
INTRAVENOUS | Status: DC | PRN
  Administered 2024-03-03: 132 mg via INTRAVENOUS

## 2024-03-03 MED ORDER — ONDANSETRON HCL 4 MG/2ML IJ SOLN
4.0000 mg | Freq: Once | INTRAMUSCULAR | Status: AC
  Administered 2024-03-03: 4 mg via INTRAVENOUS
  Filled 2024-03-03: qty 2

## 2024-03-03 MED ORDER — LACTATED RINGERS IV SOLN: INTRAVENOUS | Status: DC

## 2024-03-03 MED ORDER — SUGAMMADEX SODIUM 200 MG/2ML IV SOLN
INTRAVENOUS | Status: AC
  Filled 2024-03-03: qty 2

## 2024-03-03 MED ORDER — POLYETHYLENE GLYCOL 3350 17 G PO PACK: 17.0000 g | PACK | Freq: Every day | ORAL | Status: DC | PRN

## 2024-03-03 MED ORDER — ENOXAPARIN SODIUM 40 MG/0.4ML IJ SOSY
40.0000 mg | PREFILLED_SYRINGE | INTRAMUSCULAR | Status: DC
  Administered 2024-03-03 – 2024-03-04 (×2): 40 mg via SUBCUTANEOUS
  Filled 2024-03-03 (×2): qty 0.4

## 2024-03-03 MED ORDER — HYDRALAZINE HCL 20 MG/ML IJ SOLN: 10.0000 mg | INTRAMUSCULAR | Status: DC | PRN

## 2024-03-03 MED ORDER — CHLORHEXIDINE GLUCONATE 0.12 % MT SOLN: 15.0000 mL | Freq: Once | OROMUCOSAL | Status: AC

## 2024-03-03 MED ORDER — ONDANSETRON HCL 4 MG/2ML IJ SOLN: 4.0000 mg | Freq: Once | INTRAMUSCULAR | Status: DC | PRN

## 2024-03-03 MED ORDER — FENTANYL CITRATE (PF) 100 MCG/2ML IJ SOLN
25.0000 ug | INTRAMUSCULAR | Status: DC | PRN
  Administered 2024-03-03: 50 ug via INTRAVENOUS
  Administered 2024-03-03: 25 ug via INTRAVENOUS

## 2024-03-03 MED ORDER — HYDROMORPHONE HCL 1 MG/ML IJ SOLN
1.0000 mg | INTRAMUSCULAR | Status: DC | PRN
  Administered 2024-03-03 – 2024-03-08 (×5): 1 mg via INTRAVENOUS
  Filled 2024-03-03 (×6): qty 1

## 2024-03-03 MED ORDER — PROPOFOL 10 MG/ML IV BOLUS
INTRAVENOUS | Status: DC | PRN
  Administered 2024-03-03: 130 mg via INTRAVENOUS
  Administered 2024-03-03: 20 ug/kg/min via INTRAVENOUS

## 2024-03-03 MED ORDER — OXYCODONE HCL 5 MG PO TABS
5.0000 mg | ORAL_TABLET | Freq: Once | ORAL | Status: AC | PRN
  Administered 2024-03-03: 5 mg via ORAL

## 2024-03-03 MED ORDER — OXYCODONE HCL 5 MG PO TABS
ORAL_TABLET | ORAL | Status: AC
  Filled 2024-03-03: qty 1

## 2024-03-03 MED ORDER — ROCURONIUM BROMIDE 10 MG/ML (PF) SYRINGE
PREFILLED_SYRINGE | INTRAVENOUS | Status: DC | PRN
  Administered 2024-03-03: 10 mg via INTRAVENOUS
  Administered 2024-03-03: 50 mg via INTRAVENOUS
  Administered 2024-03-03 (×2): 10 mg via INTRAVENOUS

## 2024-03-03 MED ORDER — OXYCODONE HCL 5 MG PO TABS
5.0000 mg | ORAL_TABLET | ORAL | Status: DC | PRN
  Administered 2024-03-04 – 2024-03-08 (×6): 5 mg via ORAL
  Filled 2024-03-03 (×6): qty 1

## 2024-03-03 MED ORDER — ONDANSETRON HCL 4 MG/2ML IJ SOLN: 4.0000 mg | Freq: Four times a day (QID) | INTRAMUSCULAR | Status: DC | PRN

## 2024-03-03 SURGICAL SUPPLY — 65 items
BAG COUNTER SPONGE SURGICOUNT (BAG) ×4 IMPLANT
BENZOIN TINCTURE PRP APPL 2/3 (GAUZE/BANDAGES/DRESSINGS) ×4 IMPLANT
BLADE CLIPPER SURG (BLADE) IMPLANT
CANISTER SUCTION 3000ML PPV (SUCTIONS) ×4 IMPLANT
CHLORAPREP W/TINT 26 (MISCELLANEOUS) ×4 IMPLANT
COVER SURGICAL LIGHT HANDLE (MISCELLANEOUS) ×4 IMPLANT
DERMABOND ADVANCED .7 DNX6 (GAUZE/BANDAGES/DRESSINGS) IMPLANT
DRAIN PENROSE .5X12 LATEX STL (DRAIN) IMPLANT
DRAPE LAPAROSCOPIC ABDOMINAL (DRAPES) ×4 IMPLANT
DRAPE LAPAROTOMY TRNSV 102X78 (DRAPES) IMPLANT
DRAPE WARM FLUID 44X44 (DRAPES) ×4 IMPLANT
DRSG OPSITE POSTOP 4X10 (GAUZE/BANDAGES/DRESSINGS) IMPLANT
DRSG OPSITE POSTOP 4X8 (GAUZE/BANDAGES/DRESSINGS) IMPLANT
DRSG TEGADERM 4X4.75 (GAUZE/BANDAGES/DRESSINGS) ×4 IMPLANT
ELECT BLADE 6.5 EXT (BLADE) IMPLANT
ELECT CAUTERY BLADE 6.4 (BLADE) ×4 IMPLANT
ELECTRODE REM PT RTRN 9FT ADLT (ELECTROSURGICAL) ×4 IMPLANT
GAUZE 4X4 16PLY ~~LOC~~+RFID DBL (SPONGE) ×4 IMPLANT
GAUZE SPONGE 4X4 12PLY STRL (GAUZE/BANDAGES/DRESSINGS) ×4 IMPLANT
GLOVE BIO SURGEON STRL SZ 6 (GLOVE) ×4 IMPLANT
GLOVE BIOGEL PI MICRO STRL 6 (GLOVE) ×4 IMPLANT
GLOVE INDICATOR 6.5 STRL GRN (GLOVE) ×4 IMPLANT
GOWN STRL REUS W/ TWL LRG LVL3 (GOWN DISPOSABLE) ×8 IMPLANT
HANDLE SUCTION POOLE (INSTRUMENTS) ×4 IMPLANT
KIT BASIN OR (CUSTOM PROCEDURE TRAY) ×4 IMPLANT
KIT TURNOVER KIT B (KITS) ×4 IMPLANT
LIGASURE IMPACT 36 18CM CVD LR (INSTRUMENTS) IMPLANT
MESH VICRYL KNITTED 12X12 (Mesh General) IMPLANT
NDL HYPO 25GX1X1/2 BEV (NEEDLE) ×4 IMPLANT
NDL INSUFFLATION 14GA 120MM (NEEDLE) IMPLANT
NEEDLE HYPO 25GX1X1/2 BEV (NEEDLE) ×4 IMPLANT
NEEDLE INSUFFLATION 14GA 120MM (NEEDLE) ×4 IMPLANT
NS IRRIG 1000ML POUR BTL (IV SOLUTION) ×8 IMPLANT
PACK GENERAL/GYN (CUSTOM PROCEDURE TRAY) ×4 IMPLANT
PAD ARMBOARD POSITIONER FOAM (MISCELLANEOUS) ×4 IMPLANT
PENCIL SMOKE EVACUATOR (MISCELLANEOUS) ×4 IMPLANT
RELOAD PROXIMATE 75MM BLUE (ENDOMECHANICALS) ×12 IMPLANT
RELOAD PROXIMATE TA60MM BLUE (ENDOMECHANICALS) ×4 IMPLANT
RELOAD STAPLE 60 BLU REG PROX (ENDOMECHANICALS) IMPLANT
RELOAD STAPLE 75 3.8 BLU REG (ENDOMECHANICALS) IMPLANT
SPECIMEN JAR LARGE (MISCELLANEOUS) IMPLANT
SPONGE T-LAP 18X18 ~~LOC~~+RFID (SPONGE) IMPLANT
STAPLER GUN LINEAR PROX 60 (STAPLE) IMPLANT
STAPLER PROXIMATE 75MM BLUE (STAPLE) IMPLANT
STAPLER SKIN PROX 35W (STAPLE) ×4 IMPLANT
STRIP CLOSURE SKIN 1/2X4 (GAUZE/BANDAGES/DRESSINGS) ×4 IMPLANT
SUT ETHIBOND 0 MO6 C/R (SUTURE) ×4 IMPLANT
SUT MNCRL AB 4-0 PS2 18 (SUTURE) ×4 IMPLANT
SUT PDS AB 0 CT1 27 (SUTURE) ×4 IMPLANT
SUT PDS AB 1 TP1 96 (SUTURE) ×8 IMPLANT
SUT PDS AB 2-0 CT2 27 (SUTURE) IMPLANT
SUT SILK 3 0 SH CR/8 (SUTURE) IMPLANT
SUT SILK 3-0 18XBRD TIE 12 (SUTURE) IMPLANT
SUT VIC AB 0 CT2 27 (SUTURE) ×4 IMPLANT
SUT VIC AB 2-0 SH 18 (SUTURE) ×4 IMPLANT
SUT VIC AB 2-0 SH 27X BRD (SUTURE) ×4 IMPLANT
SUT VIC AB 3-0 SH 18 (SUTURE) ×4 IMPLANT
SUT VIC AB 3-0 SH 27XBRD (SUTURE) ×4 IMPLANT
SUT VICRYL AB 2 0 TIES (SUTURE) ×4 IMPLANT
SUT VICRYL AB 3 0 TIES (SUTURE) ×4 IMPLANT
SYR CONTROL 10ML LL (SYRINGE) ×4 IMPLANT
TOWEL GREEN STERILE (TOWEL DISPOSABLE) ×4 IMPLANT
TOWEL GREEN STERILE FF (TOWEL DISPOSABLE) ×4 IMPLANT
TRAY FOLEY MTR SLVR 16FR STAT (SET/KITS/TRAYS/PACK) IMPLANT
TRAY FOLEY W/BAG SLVR 16FR ST (SET/KITS/TRAYS/PACK) IMPLANT

## 2024-03-03 NOTE — ED Notes (Addendum)
 Received report from [facility] RN from [facility] ER , Assumed care on patient , resting with no distress/respirations unlabored , waiting for Editor, commissioning .

## 2024-03-03 NOTE — ED Notes (Signed)
 Patient transported to CT

## 2024-03-03 NOTE — Transfer of Care (Signed)
 Immediate Anesthesia Transfer of Care Note  Patient: Jillian Norton  Procedure(s) Performed: REPAIR, HERNIA, INGUINAL/FEMORAL, ADULT (Groin) EXPLORATION, ABDOMEN, LAPAROSCOPIC EXCISION, SMALL INTESTINE (Abdomen) INSERTION OF VICRYL MESH, LEFT GROIN (Left: Groin)  Patient Location: PACU  Anesthesia Type:General  Level of Consciousness: awake, alert , and oriented  Airway & Oxygen Therapy: Patient Spontanous Breathing and Patient connected to nasal cannula oxygen  Post-op Assessment: Report given to RN, Post -op Vital signs reviewed and stable, Patient moving all extremities X 4, and Patient able to stick tongue midline  Post vital signs: Reviewed and stable  Last Vitals:  Vitals Value Taken Time  BP 149/65 03/03/24 11:24  Temp 37.4 C 03/03/24 11:22  Pulse 76 03/03/24 11:25  Resp 15 03/03/24 11:25  SpO2 93 % 03/03/24 11:25  Vitals shown include unfiled device data.  Last Pain:  Vitals:   03/03/24 0821  TempSrc:   PainSc: 3       Patients Stated Pain Goal: 3 (03/03/24 9178)  Complications: No notable events documented.

## 2024-03-03 NOTE — ED Provider Notes (Signed)
 [place] EMERGENCY DEPARTMENT AT [facility]  Provider Note  CSN: [id number] Arrival date & time: 03/02/24 2219  History Chief Complaint  Patient presents with   Abdominal Pain    Jillian Norton is a 83 y.o. female here via EMS with daughter at bedside. She reports she has had a cold recently, took some Dayquil this morning but has begun to have lower abdominal pain starting this evening with one episode of vomiting. She has not urinated, but does not feel like she had a full bladder. No fever.    Home Medications Prior to Admission medications   Medication Sig Start Date End Date Taking? Authorizing Provider  acetaminophen  (TYLENOL ) 325 MG tablet Take 650 mg by mouth as needed. Pt stated, When I have pain, takes 3 tablets at a time for the pain; it does help    [provider]  albuterol  (VENTOLIN  HFA) 108 (90 Base) MCG/ACT inhaler Use 2 puffs 2x a day prn Inhalation as directed 30 days 08/31/21     ascorbic acid (VITAMIN C) 250 MG tablet Take by mouth.    [provider]  Biotin 2.5 MG CAPS Take by mouth.    [provider]  buPROPion  (WELLBUTRIN  XL) 300 MG 24 hr tablet Take 1 tablet (300 mg total) by mouth in the morning. 07/23/23     busPIRone  (BUSPAR ) 5 MG tablet Take 1 tablet (5 mg total) by mouth daily. 05/14/22     calcium-vitamin D (OSCAL WITH D) 500-200 MG-UNIT TABS tablet Take by mouth.    [provider]  cyclobenzaprine  (FLEXERIL ) 10 MG tablet 1 tablet PO twice daily as directed muscle relaxer as needed Orally twice a day 10 days 03/02/22     Diclofenac Sodium (VOLTAREN EX) Apply topically as needed.    [provider]  EPINEPHrine  (EPIPEN  2-PAK) 0.3 mg/0.3 mL IJ SOAJ injection as directed Injection daily 1 days 04/28/21     EPINEPHrine  (EPIPEN  2-PAK) 0.3 mg/0.3 mL IJ SOAJ injection Inject as directed 05/14/22     fexofenadine (ALLEGRA) 180 MG tablet 180 mg. 08/18/13   [provider]  gabapentin  (NEURONTIN ) 100  MG capsule Take 1 capsule (100 mg total) by mouth at bedtime as needed. 01/08/23   Delane Lye, MD  gabapentin  (NEURONTIN ) 100 MG capsule Take 1 capsule (100 mg total) by mouth 3 (three) times daily as needed. 05/07/23     meloxicam  (MOBIC ) 7.5 MG tablet Take 1 tablet (7.5 mg total) by mouth daily as needed. 02/19/23     moxifloxacin  (VIGAMOX ) 0.5 % ophthalmic solution Instill 1 drop in right eye every hour while awake for 24 hours, then use 4 times daily 08/09/23     omeprazole  (PRILOSEC) 40 MG capsule Take 1 capsule (40 mg total) by mouth daily. 09/19/22     omeprazole  (PRILOSEC) 40 MG capsule Take 1 capsule (40 mg total) by mouth daily. 10/04/23     oxybutynin  (DITROPAN -XL) 10 MG 24 hr tablet Take 1 tablet (10 mg total) by mouth every evening at bedtime. 07/04/20     Semaglutide -Weight Management (WEGOVY ) 2.4 MG/0.75ML SOAJ Inject 2.4 mg into the skin once a week as directed 09/19/22     solifenacin  (VESICARE ) 5 MG tablet Take 5 mg by mouth daily.    [provider]  tirzepatide  (ZEPBOUND ) 10 MG/0.5ML Pen Inject 10 mg into the skin once a week. 07/15/23     traMADol  (ULTRAM ) 50 MG tablet Take 1 tablet (50 mg total) by mouth every 6 (six)  hours as needed for pain for 5 days. 05/07/23     traMADol  (ULTRAM ) 50 MG tablet Take 1 tablet (50 mg total) by mouth every 6 (six) hours as needed for pain for 5 days. 08/14/23     traZODone  (DESYREL ) 100 MG tablet Take 1 tablet (100 mg total) by mouth at bedtime. 09/19/22     triamcinolone  cream (KENALOG ) 0.1 % Apply 1 application on the skin twice a day Apply thin layer to affected areas twice a day x 1-3 weeks as needed. 03/25/23     valACYclovir  (VALTREX ) 1000 MG tablet Take 1 tablet (1,000 mg total) by mouth 2 (two) times daily for 4 days as needed for cold sores. 04/24/23     zolpidem  (AMBIEN ) 5 MG tablet Take 1-2 tablets (5-10 mg total) by mouth at bedtime as needed as directed 10/03/23     zolpidem  (AMBIEN ) 5 MG tablet Take 1-2 tablets (5-10 mg total) by  mouth at bedtime as needed 12/04/23        Allergies    Sporanos [itraconazole] and Sulfa antibiotics   Review of Systems   Review of Systems Please see HPI for pertinent positives and negatives  Physical Exam BP (!) 160/77   Pulse 82   Temp 99.1 F (37.3 C) (Oral)   Resp 16   SpO2 99%   Physical Exam Vitals and nursing note reviewed.  Constitutional:      Appearance: Normal appearance.  HENT:     Head: Normocephalic and atraumatic.     Nose: Nose normal.     Mouth/Throat:     Mouth: Mucous membranes are moist.  Eyes:     Extraocular Movements: Extraocular movements intact.     Conjunctiva/sclera: Conjunctivae normal.  Cardiovascular:     Rate and Rhythm: Normal rate.  Pulmonary:     Effort: Pulmonary effort is normal.     Breath sounds: Normal breath sounds.  Abdominal:     General: Abdomen is flat.     Palpations: Abdomen is soft.     Tenderness: There is generalized abdominal tenderness.     Hernia: A hernia is present. Hernia is present in the left inguinal area (possible).  Musculoskeletal:        General: No swelling. Normal range of motion.     Cervical back: Neck supple.  Skin:    General: Skin is warm and dry.  Neurological:     General: No focal deficit present.     Mental Status: She is alert.  Psychiatric:        Mood and Affect: Mood normal.     ED Results / Procedures / Treatments   EKG None  Procedures Procedures  Medications Ordered in the ED Medications  morphine  (PF) 4 MG/ML injection 4 mg (4 mg Intravenous Given 03/03/24 0234)  ondansetron  (ZOFRAN ) injection 4 mg (4 mg Intravenous Given 03/03/24 0234)  iohexol  (OMNIPAQUE ) 300 MG/ML solution 85 mL (85 mLs Intravenous Contrast Given 03/03/24 0237)  lactated ringers  bolus 1,000 mL (0 mLs Intravenous Stopped 03/03/24 0401)  morphine  (PF) 4 MG/ML injection 4 mg (4 mg Intravenous Given 03/03/24 0307)    Initial Impression and Plan  Patient here with lower abdominal pain, some decreased urine  output but does not seem like urinary retention. Exam concerning for inguinal hernia, did not tolerate attempt to reduce at initial eval. Labs done in triage with unremarkable CBC, CMP and lipase, will check bladder scan. Pain/nausea meds and re-attempt at hernia reduction. Send for CT to eval other  causes of her symptoms.   ED Course   Clinical Course as of 03/03/24 0426  Tue Mar 03, 2024  0217 <300cc urine on bladder scan, will give IVF to help facilitate urine collection.  [CS]  [id number] I personally viewed the images from radiology studies and agree with radiologist interpretation: CT confirms suspected inguinal hernia with signs of SBO. Pain improved with medication but still unable to reduce hernia. Will discuss with General Surgery.  [CS]  0422 Spoke with Dr. Signe, Gen Surg, who requests the patient be sent to the [facility] for evaluation.  [CS]    Clinical Course User Index [CS] Roselyn Carlin NOVAK, MD     MDM Rules/Calculators/A&P Medical Decision Making Problems Addressed: Non-recurrent unilateral inguinal hernia with obstruction without gangrene: acute illness or injury  Amount and/or Complexity of Data Reviewed Labs: ordered. Decision-making details documented in ED Course. Radiology: ordered and independent interpretation performed. Decision-making details documented in ED Course.  Risk Prescription drug management. Parenteral controlled substances. Decision regarding hospitalization.     Final Clinical Impression(s) / ED Diagnoses Final diagnoses:  Non-recurrent unilateral inguinal hernia with obstruction without gangrene    Rx / DC Orders ED Discharge Orders     None        Roselyn Carlin NOVAK, MD 03/03/24 [phone]

## 2024-03-03 NOTE — ED Provider Notes (Signed)
 I assumed care of this patient from previous provider.  Please see their note for further details of history, exam, and MDM.   Briefly patient is a 83 y.o. female who presented [facility] for incarcerated left inguinal hernia.  Surgery aware.  Will need to reconsult upon arrival.  Patient is hemodynamically stable.  Still has hernia in the left inguinal region tender to palpation.  Provided with additional IV fluids and pain medicine.  I spoke with Dr. Signe from general surgery who evaluated patient in the emergency department and plan to admit      Margeret Stachnik, Raynell Moder, MD 03/03/24 [phone]

## 2024-03-03 NOTE — ED Notes (Signed)
 Dr. Signe( surgeon)  at bedside .

## 2024-03-03 NOTE — Anesthesia Postprocedure Evaluation (Signed)
 Anesthesia Post Note  Patient: Jillian Norton  Procedure(s) Performed: REPAIR, HERNIA, INGUINAL/FEMORAL, ADULT (Groin) EXPLORATION, ABDOMEN, LAPAROSCOPIC EXCISION, SMALL INTESTINE (Abdomen) INSERTION OF VICRYL MESH, LEFT GROIN (Left: Groin)     Patient location during evaluation: PACU Anesthesia Type: General Level of consciousness: awake and alert Pain management: pain level controlled Vital Signs Assessment: post-procedure vital signs reviewed and stable Respiratory status: spontaneous breathing, nonlabored ventilation, respiratory function stable and patient connected to nasal cannula oxygen Cardiovascular status: blood pressure returned to baseline and stable Postop Assessment: no apparent nausea or vomiting Anesthetic complications: no   No notable events documented.  Last Vitals:  Vitals:   03/03/24 1245 03/03/24 1300  BP: (!) 117/56 (!) 135/57  Pulse: 75 69  Resp: 14 14  Temp:    SpO2: 96% 97%    Last Pain:  Vitals:   03/03/24 1230  TempSrc:   PainSc: 3                  Debby FORBES Like

## 2024-03-03 NOTE — Interval H&P Note (Signed)
 History and Physical Interval Note:  03/03/2024 9:17 AM  Jillian Norton  has presented today for surgery, with the diagnosis of incarcerated inguinal hernia left.  The various methods of treatment have been discussed with the patient and family. After consideration of risks, benefits and other options for treatment, the patient has consented to  Procedure(s) with comments: REPAIR, HERNIA, INGUINAL, ADULT (N/A) - LEFT INGUINAL HERNIA  REPAIR POSSIBLE LAPAROTOMY WITH SMALL BOWEL RESECTION LAPAROTOMY, EXPLORATORY (N/A) as a surgical intervention.  The patient's history has been reviewed, patient examined, no change in status, stable for surgery.  I have reviewed the patient's chart and labs.  Questions were answered to the patient's satisfaction.     Richerd Silversmith

## 2024-03-03 NOTE — Op Note (Signed)
 Inguinal Hernia Repair Procedure Note   Pre-operative Diagnosis: Incarcerated left femoral hernia  Post-operative Diagnosis: Incarcerated left femoral hernia  Procedure (s) Performed: Left femoral hernia repair with mesh, diagnostic laparoscopy, small bowel resection with primary anastomosis  Teaching Surgeon:  Orie Silversmith, MD.  Assistant (s): Adina Bury MD  Anesthesia:  General  Specimens: Small bowel         Drains: None.   Cultures:  None.  Estimated Blood Loss:  Minimal.               Complications:  None; patient tolerated the procedure well.  Indications:  Jillian Norton presented to the emergency department with incarcerated left femoral hernia with signs of obstruction.  Decision was made to take emergently to the operating room for open left femoral hernia repair, diagnostic laparoscopy, with possibility of bowel resection.  Operative Findings:  There was a left femoral hernia with incarcerated small bowel.  Area of incarcerated small bowel had some areas of developing necrosis so decision was made to resect and perform primary anastomosis.  After primary anastomosis there was some duskiness to the proximal bowel so decision was made to revise our anastomosis.  Bowel appeared healthy and pink at conclusion of our second anastomosis.  Procedure Details:  The patient was positively identified and consent was verified in the holding area. The operative site was verified with the patient and marked appropriately in the holding area. The patient was taken from the holding area to the operating room and placed in supine position on the operating room table, being careful to pad all pressure points and to secure the patient safely to the table. Ancef  given in ED. Sequential compression devices were placed for DVT prophylaxis.  Prior to beginning anesthesia, a timeout was called to verify the patient's identification, the patient's allergies and the intended procedure.   Anesthesia was induced. The patient was prepped and draped in the usual sterile fashion. A second timeout was called to verify the patient's identification, the intended procedure and the patient's allergies.  The procedure was begun by identifying the anterior superior iliac spine and the pubic tubercle. The skin was marked for an oblique incision from just medial to the palpated pubic tubercle and superior to the inguinal ligament for a length of approximately 6 cm. Local anesthesia was achieved by infiltration of 1 ml of quarter percent Marcaine  with epi.After infiltration of local anesthesia, a skin incision was made with a #15 blade. The subcutaneous tissue was dissected with electrocautery. The external oblique fascia was identified. A #15 blade was used to make a small nick in the fascia. The fascia was elevated. The external oblique fascia was cleared on the underside bluntly with closed scissors and scissors were then used to make an incision sharply in the external oblique in the direction of its fibers down through the external ring. The external oblique fascia was retracted laterally with hemostats. The undersurface of the external oblique was cleared bluntly with a raytec and manual dissection.  Hernia sac was identified and sharply opened with Metzenbaum scissors.  No obvious small bowel was seen in the hernia sac.  At this point we elected to perform a diagnostic laparoscopy.  This was attempted through the hernia sac initially but was unable to visualize the bowel clearly.  Decision was made to place an additional trocar at the umbilicus.  The abdomen was insufflated using a Veress placed in the left upper quadrant, aspirated air and positive drop test.  Abdomen then  insufflated to 15 mmHg. incision made inferior to the umbilicus and 5 mm trocar was placed using Optiview technique.  It appeared on diagnostic laparoscopy that we were not completely through the floor of the abdominal wall and small  bowel was still incarcerated in the femoral space.  We then turned our attention back to the open femoral incision.  We were able to dissect further in order to open up the floor and at this point were readily able to evaluate the small bowel which was at this point reduced.  There was an area of necrotic appearing small bowel so decision was made to resect.    The small bowel was divided proximally and distally to the area of necrosis using 2 separate loads of a GIA blue 75 stapler.  The mesentery was divided using a LigaSure.  We then approximated the 2 pieces of small bowel and a side-to-side functional end orientation.  An enterotomy was made on both segments of small bowel and then a common enterotomy was created using another blue load of the GIA 75 stapler.  We then used a blue load of the TA 60 stapler to close the common enterotomy.  At this point we examined the anastomosis and it there did appear to be some duskiness to the proximal segment of bowel.  Decision was made to revise her anastomosis.  2 additional loads of the blue 75 GIA stapler were used to divide the proximal and distal bowel.  Again in a similar fashion we created enterotomies on the proximal and then distal end of the small bowel.  Common enterotomy was created using an additional load of the GIA 75 blue stapler.  Common enterotomy closed with a TA 60 blue load.  Anastomosis appeared viable.  Anastomosis was wide open without any concern for narrowing.  We placed a crotch stitch using a 2-0 silk suture.  At this point the small bowel was dunked back into the abdomen.  We then turned our attention back to the femoral hernia space there was some fat still within the femoral canal were able to reduce most of this.  We then elected to place a piece of Vicryl mesh given that she had to have a bowel resection and we did not want more permanent mesh to be used.  We affixed to the Vicryl mesh to the pubic tubercle and to the inguinal ligament.   The wound was irrigated and hemostasis was ensured. The external oblique was closed with 2-0 Vicryl running sutures. Scarpa's fascia was closed with interrupted 3-0 Vicryl suture. The skin was closed with running subcuticular 4-0 Monocryl suture and dressed with dermabond. The patient was awakened and taken from the Operating Room to recovery area in stable condition.   Condition: Stable  Disposition: PACU - hemodynamically stable.   Postop plan: - Clear liquid diet, advance diet as tolerated - Will continue antibiotics for 24 hours.  Richerd Silversmith, MD  03/03/2024 6:22 PM

## 2024-03-03 NOTE — Anesthesia Procedure Notes (Signed)
 Procedure Name: Intubation Date/Time: 03/03/2024 9:40 AM  Performed by: Harrold Macintosh, CRNAPre-anesthesia Checklist: Patient identified, Emergency Drugs available, Suction available and Patient being monitored Patient Re-evaluated:Patient Re-evaluated prior to induction Oxygen Delivery Method: Circle system utilized Preoxygenation: Pre-oxygenation with 100% oxygen Induction Type: IV induction Ventilation: Mask ventilation without difficulty Laryngoscope Size: Miller and 2 Grade View: Grade I Tube type: Oral Tube size: 7.0 mm Number of attempts: 1 Airway Equipment and Method: Stylet and Bite block Placement Confirmation: ETT inserted through vocal cords under direct vision, positive ETCO2 and breath sounds checked- equal and bilateral Secured at: 21 cm Tube secured with: Tape Dental Injury: Teeth and Oropharynx as per pre-operative assessment

## 2024-03-03 NOTE — Progress Notes (Signed)
 Pt arrived to 6 north room 3

## 2024-03-03 NOTE — Anesthesia Preprocedure Evaluation (Addendum)
 Anesthesia Evaluation  Patient identified by MRN, date of birth, ID band Patient awake    Reviewed: Allergy & Precautions, NPO status , Patient's Chart, lab work & pertinent test results  History of Anesthesia Complications Negative for: history of anesthetic complications  Airway Mallampati: II  TM Distance: >3 FB Neck ROM: Full    Dental  (+) Dental Advisory Given, Chipped,    Pulmonary former smoker   Pulmonary exam normal        Cardiovascular + DVT  Normal cardiovascular exam     Neuro/Psych  PSYCHIATRIC DISORDERS Anxiety     negative neurological ROS     GI/Hepatic Neg liver ROS,GERD  Medicated and Controlled,,  Endo/Other  negative endocrine ROS    Renal/GU negative Renal ROS  Female GU complaint     Musculoskeletal  (+) Arthritis ,    Abdominal   Peds  Hematology negative hematology ROS (+)   Anesthesia Other Findings   Reproductive/Obstetrics  Breast cancer                               Anesthesia Physical Anesthesia Plan  ASA: 2  Anesthesia Plan: General   Post-op Pain Management: Tylenol  PO (pre-op)*   Induction: Intravenous  PONV Risk Score and Plan: 3 and Treatment may vary due to age or medical condition, Ondansetron  and Propofol  infusion  Airway Management Planned: Oral ETT  Additional Equipment: None  Intra-op Plan:   Post-operative Plan: Extubation in OR  Informed Consent: I have reviewed the patients History and Physical, chart, labs and discussed the procedure including the risks, benefits and alternatives for the proposed anesthesia with the patient or authorized representative who has indicated his/her understanding and acceptance.     Dental advisory given  Plan Discussed with: CRNA and Anesthesiologist  Anesthesia Plan Comments:          Anesthesia Quick Evaluation

## 2024-03-03 NOTE — H&P (Signed)
 Surgical Evaluation  Chief Complaint: Abdominal pain  HPI: Very pleasant and relatively healthy/active 83 year old woman who presents as a transfer from med center at [facility] with lower abdominal pain which began around 5 PM yesterday with 1 associated episode of vomiting.  Leading up to this, she did have a cold with a fair amount of coughing.  Has not eaten anything since yesterday morning.  Reports her last bowel movement/flatus was yesterday.  Has had ongoing pain now localized to the lower abdomen and left groin.  Found to have an incarcerated left femoral hernia containing bowel and transferred to [facility] for further evaluation.  Allergies  Allergen Reactions   Sporanos [Itraconazole] Anaphylaxis   Sulfa Antibiotics Nausea And Vomiting    Other reaction(s): Nausea and Vomiting    Past Medical History:  Diagnosis Date   Anxiety    Breast cancer (HCC) 06/27/2012   Right   Cataract    DVT (deep venous thrombosis) (HCC) 06/28/2015   Dyslipidemia    GERD (gastroesophageal reflux disease)    Insomnia    Osteopenia    Overactive bladder    Personal history of radiation therapy    Shingles 06/2016   Thyroid  disease     Past Surgical History:  Procedure Laterality Date   BREAST LUMPECTOMY Right 2014   BREAST LUMPECTOMY  2013   right   CATARACT EXTRACTION W/ INTRAOCULAR LENS IMPLANT Bilateral    COLONOSCOPY  08/27/2012   [place]    KNEE ARTHROSCOPY     left   LASIK Bilateral    REDUCTION MAMMAPLASTY Bilateral 1985   anchor scar    VAGINAL HYSTERECTOMY  2002    Family History  Problem Relation Age of Onset   Lung cancer Mother    Colon cancer Father     Social History   Socioeconomic History   Marital status: Significant Other    Spouse name: Not on file   Number of children: Not on file   Years of education: Not on file   Highest education level: Not on file  Occupational History   Not on file  Tobacco Use   Smoking status: Former    Current packs/day:  1.00    Average packs/day: 1 pack/day for 10.0 years (10.0 ttl pk-yrs)    Types: Cigarettes   Smokeless tobacco: Never  Vaping Use   Vaping status: Never Used  Substance and Sexual Activity   Alcohol  use: Yes    Comment: 1-2 drinks per day   Drug use: No   Sexual activity: Not Currently  Other Topics Concern   Not on file  Social History Narrative   Diet: General       Caffeine: Yes      Married, if yes what year: Widow, 1961/1988      Do you live in a house, apartment, assisted living, condo, trailer, etc: 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 n   Do you have difficulty preparing food or eating?n   Do you have difficulty managing your medications?n   Do you have difficulty managing your finances?n   Do you have difficulty affording your medications?n   Social Drivers of Health  Financial Resource Strain: Not on file  Food Insecurity: Not on file  Transportation Needs: Not on file  Physical Activity: Not on file  Stress: Not on file  Social Connections: Not on file    No current facility-administered medications on file prior to encounter.   Current Outpatient Medications on File Prior to Encounter  Medication Sig Dispense Refill   acetaminophen  (TYLENOL ) 325 MG tablet Take 650 mg by mouth as needed. Pt stated, When I have pain, takes 3 tablets at a time for the pain; it does help     albuterol  (VENTOLIN  HFA) 108 (90 Base) MCG/ACT inhaler Use 2 puffs 2x a day prn Inhalation as directed 30 days 18 g 0   ascorbic acid (VITAMIN C) 250 MG tablet Take by mouth.     Biotin 2.5 MG CAPS Take by mouth.     buPROPion  (WELLBUTRIN  XL) 300 MG 24 hr tablet Take 1 tablet (300 mg total) by mouth in the morning. 90 tablet 2   busPIRone  (BUSPAR ) 5 MG  tablet Take 1 tablet (5 mg total) by mouth daily. 30 tablet 5   calcium-vitamin D (OSCAL WITH D) 500-200 MG-UNIT TABS tablet Take by mouth.     cyclobenzaprine  (FLEXERIL ) 10 MG tablet 1 tablet PO twice daily as directed muscle relaxer as needed Orally twice a day 10 days 20 tablet 0   Diclofenac Sodium (VOLTAREN EX) Apply topically as needed.     EPINEPHrine  (EPIPEN  2-PAK) 0.3 mg/0.3 mL IJ SOAJ injection as directed Injection daily 1 days 2 each 0   EPINEPHrine  (EPIPEN  2-PAK) 0.3 mg/0.3 mL IJ SOAJ injection Inject as directed 2 each 0   fexofenadine (ALLEGRA) 180 MG tablet 180 mg.     gabapentin  (NEURONTIN ) 100 MG capsule Take 1 capsule (100 mg total) by mouth at bedtime as needed. 30 capsule 0   gabapentin  (NEURONTIN ) 100 MG capsule Take 1 capsule (100 mg total) by mouth 3 (three) times daily as needed. 90 capsule 0   meloxicam  (MOBIC ) 7.5 MG tablet Take 1 tablet (7.5 mg total) by mouth daily as needed. 45 tablet 0   moxifloxacin  (VIGAMOX ) 0.5 % ophthalmic solution Instill 1 drop in right eye every hour while awake for 24 hours, then use 4 times daily 3 mL 2   omeprazole  (PRILOSEC) 40 MG capsule Take 1 capsule (40 mg total) by mouth daily. 90 capsule 3   omeprazole  (PRILOSEC) 40 MG capsule Take 1 capsule (40 mg total) by mouth daily. 90 capsule 3   oxybutynin  (DITROPAN -XL) 10 MG 24 hr tablet Take 1 tablet (10 mg total) by mouth every evening at bedtime. 90 tablet 2   Semaglutide -Weight Management (WEGOVY ) 2.4 MG/0.75ML SOAJ Inject 2.4 mg into the skin once a week as directed 9 mL 3   solifenacin  (VESICARE ) 5 MG tablet Take 5 mg by mouth daily.     tirzepatide  (ZEPBOUND ) 10 MG/0.5ML Pen Inject 10 mg into the skin once a week. 2 mL 3   traMADol  (ULTRAM ) 50 MG tablet Take 1 tablet (50 mg total) by mouth every 6 (six) hours as needed for pain for 5 days. 20 tablet 0   traMADol  (ULTRAM ) 50 MG tablet Take 1 tablet (50 mg total) by mouth every 6 (six) hours as needed for pain for 5 days. 20 tablet 0    traZODone  (DESYREL ) 100 MG tablet Take 1 tablet (100 mg total) by mouth at bedtime. 90 tablet 3   triamcinolone  cream (KENALOG ) 0.1 % Apply 1 application on the skin twice  a day Apply thin layer to affected areas twice a day x 1-3 weeks as needed. 80 g 0   valACYclovir  (VALTREX ) 1000 MG tablet Take 1 tablet (1,000 mg total) by mouth 2 (two) times daily for 4 days as needed for cold sores. 30 tablet 0   zolpidem  (AMBIEN ) 5 MG tablet Take 1-2 tablets (5-10 mg total) by mouth at bedtime as needed as directed 45 tablet 1   zolpidem  (AMBIEN ) 5 MG tablet Take 1-2 tablets (5-10 mg total) by mouth at bedtime as needed 45 tablet 0    Review of Systems: a complete, 10pt review of systems was completed with pertinent positives and negatives as documented in the HPI  Physical Exam: Vitals:   03/03/24 0515 03/03/24 0551  BP: (!) 115/55 127/68  Pulse: 76 81  Resp: 16 14  Temp:  (!) 96.3 F (35.7 C)  SpO2: 91% 99%   Gen: A&Ox3, no distress  Chest: respiratory effort is normal.  Cardiovascular: RRR with palpable distal pulses, no pedal edema Gastrointestinal: Soft, minimally distended, nontender.  Firm tender mass in the left medial groin consistent with known incarcerated hernia.  Not reducible. Muscoloskeletal: no clubbing or cyanosis of the fingers.  Strength is symmetrical throughout.  Range of motion of bilateral upper and lower extremities normal without pain, crepitation or contracture. Neuro: No gross deficit Psych: appropriate mood and affect, normal insight/judgment intact  Skin: warm and dry      Latest Ref Rng & Units 03/02/2024   10:28 PM 05/27/2017    2:17 PM 11/01/2016   10:31 AM  CBC  WBC 4.0 - 10.5 K/uL 7.4  4.9  3.6   Hemoglobin 12.0 - 15.0 g/dL 86.9  87.3  86.5   Hematocrit 36.0 - 46.0 % 39.2  37.0  40.2   Platelets 150 - 400 K/uL 231  238  234        Latest Ref Rng & Units 03/02/2024   10:28 PM 05/27/2017    2:17 PM 11/01/2016   10:31 AM  CMP  Glucose 70 - 99 mg/dL 866  882   88   BUN 8 - 23 mg/dL 15  14  21    Creatinine 0.44 - 1.00 mg/dL 9.08  9.11  9.22   Sodium 135 - 145 mmol/L 138  140  138   Potassium 3.5 - 5.1 mmol/L 4.1  4.4  4.8   Chloride 98 - 111 mmol/L 102  105  104   CO2 22 - 32 mmol/L 22  27  27    Calcium 8.9 - 10.3 mg/dL 9.9  9.4  9.5   Total Protein 6.5 - 8.1 g/dL 7.4  6.6  6.8   Total Bilirubin 0.0 - 1.2 mg/dL 0.4  0.4  0.4   Alkaline Phos 38 - 126 U/L 59   42   AST 15 - 41 U/L 14  16  13    ALT 0 - 44 U/L 12  28  16      No results found for: INR, PROTIME  Imaging: CT ABDOMEN PELVIS W CONTRAST Result Date: 03/03/2024 CLINICAL DATA:  Bowel obstruction suspected. Left inguinal hernia. Lower abdominal and pelvic pain. Difficulty urinating. Nausea and vomiting. EXAM: CT ABDOMEN AND PELVIS WITH CONTRAST TECHNIQUE: Multidetector CT imaging of the abdomen and pelvis was performed using the standard protocol following bolus administration of intravenous contrast. RADIATION DOSE REDUCTION: This exam was performed according to the departmental dose-optimization program which includes automated exposure control, adjustment of the mA and/or kV according to  patient size and/or use of iterative reconstruction technique. CONTRAST:  85mL OMNIPAQUE  IOHEXOL  300 MG/ML  SOLN COMPARISON:  Ultrasound abdomen 08/09/2022 FINDINGS: Lower chest: Lung bases are clear. Hepatobiliary: No focal liver abnormality is seen. No gallstones, gallbladder wall thickening, or biliary dilatation. Pancreas: Unremarkable. No pancreatic ductal dilatation or surrounding inflammatory changes. Spleen: Normal in size without focal abnormality. Adrenals/Urinary Tract: Adrenal glands are unremarkable. Kidneys are normal, without renal calculi, focal lesion, or hydronephrosis. Bladder is unremarkable. Stomach/Bowel: Small left inguinal hernia containing a loop of small bowel with proximal dilated bowel loops with air-fluid levels consistent with proximal obstruction. Remainder of the small bowel,  stomach, and colon are decompressed. No bowel wall thickening, pneumatosis, or portal venous gas. Appendix is normal. Vascular/Lymphatic: Aortic atherosclerosis. No enlarged abdominal or pelvic lymph nodes. Reproductive: Status post hysterectomy. No adnexal masses. Other: No free air or free fluid in the abdomen. Musculoskeletal: Degenerative changes in the spine. No acute bony abnormalities. IMPRESSION: 1. Left inguinal hernia containing small bowel with left lower quadrant anterior dilated small bowel with gas and fluid levels consistent with small-bowel obstruction. No ischemic changes are identified. 2. Aortic atherosclerosis. Electronically Signed   By: Elsie Gravely M.D.   On: 03/03/2024 02:50     A/P: Incarcerated left likely femoral hernia with partial obstruction.  I discussed the relevant anatomy with the patient and recommend proceeding urgently to the operating room for hernia repair.  We discussed possibility of needing bowel resection/laparotomy or laparoscopy as well as typical postoperative recovery and timeline.  Questions welcomed and answered.  We will plan for surgery this morning with Dr. Ann.    Patient Active Problem List   Diagnosis Date Noted   Arthritis of carpometacarpal [facility]) joint of left thumb 03/05/2018   De Quervain's tenosynovitis, right 03/05/2018   Malignant neoplasm of upper-inner quadrant of right breast in female, estrogen receptor positive (HCC) 10/24/2017   Overweight (BMI 25.0-29.9) 04/07/2017   History of cancer of right breast 04/07/2017   History of DVT (deep vein thrombosis) 04/07/2017   GERD (gastroesophageal reflux disease) 11/01/2016   Primary insomnia 11/01/2016   Urge incontinence 11/01/2016   History of colon polyps 11/01/2016   Postnasal drip 11/01/2016   Shingles 06/27/2016       Mitzie Freund, MD [facility]  See AMION to contact appropriate on-call provider

## 2024-03-04 ENCOUNTER — Encounter (HOSPITAL_COMMUNITY): Payer: Self-pay | Admitting: General Surgery

## 2024-03-04 ENCOUNTER — Inpatient Hospital Stay (HOSPITAL_COMMUNITY)

## 2024-03-04 LAB — CBC
HCT: 35.8 % — ABNORMAL LOW (ref 36.0–46.0)
Hemoglobin: 11.9 g/dL — ABNORMAL LOW (ref 12.0–15.0)
MCH: 30.4 pg (ref 26.0–34.0)
MCHC: 33.2 g/dL (ref 30.0–36.0)
MCV: 91.3 fL (ref 80.0–100.0)
Platelets: 207 K/uL (ref 150–400)
RBC: 3.92 MIL/uL (ref 3.87–5.11)
RDW: 13.2 % (ref 11.5–15.5)
WBC: 6.4 K/uL (ref 4.0–10.5)
nRBC: 0 % (ref 0.0–0.2)

## 2024-03-04 LAB — BASIC METABOLIC PANEL WITH GFR
Anion gap: 6 (ref 5–15)
BUN: 15 mg/dL (ref 8–23)
CO2: 23 mmol/L (ref 22–32)
Calcium: 8.1 mg/dL — ABNORMAL LOW (ref 8.9–10.3)
Chloride: 102 mmol/L (ref 98–111)
Creatinine, Ser: 1.51 mg/dL — ABNORMAL HIGH (ref 0.44–1.00)
GFR, Estimated: 34 mL/min — ABNORMAL LOW (ref >60)
Glucose, Bld: 137 mg/dL — ABNORMAL HIGH (ref 70–99)
Potassium: 4.2 mmol/L (ref 3.5–5.1)
Sodium: 131 mmol/L — ABNORMAL LOW (ref 135–145)

## 2024-03-04 LAB — PHOSPHORUS: Phosphorus: 3.3 mg/dL (ref 2.5–4.6)

## 2024-03-04 LAB — SURGICAL PATHOLOGY

## 2024-03-04 LAB — MAGNESIUM: Magnesium: 1.8 mg/dL (ref 1.7–2.4)

## 2024-03-04 MED ORDER — GUAIFENESIN ER 600 MG PO TB12
600.0000 mg | ORAL_TABLET | Freq: Two times a day (BID) | ORAL | Status: DC
  Administered 2024-03-04 – 2024-03-10 (×13): 600 mg via ORAL
  Filled 2024-03-04 (×13): qty 1

## 2024-03-04 MED ORDER — ZOLPIDEM TARTRATE 5 MG PO TABS
5.0000 mg | ORAL_TABLET | Freq: Every evening | ORAL | Status: DC | PRN
  Administered 2024-03-05 – 2024-03-09 (×5): 10 mg via ORAL
  Filled 2024-03-04 (×5): qty 2

## 2024-03-04 MED ORDER — LACTATED RINGERS IV BOLUS
500.0000 mL | Freq: Once | INTRAVENOUS | Status: AC
  Administered 2024-03-04: 500 mL via INTRAVENOUS

## 2024-03-04 MED ORDER — ENOXAPARIN SODIUM 30 MG/0.3ML IJ SOSY
30.0000 mg | PREFILLED_SYRINGE | INTRAMUSCULAR | Status: DC
  Administered 2024-03-05 – 2024-03-08 (×4): 30 mg via SUBCUTANEOUS
  Filled 2024-03-04 (×4): qty 0.3

## 2024-03-04 MED ORDER — BUPROPION HCL ER (XL) 150 MG PO TB24
300.0000 mg | ORAL_TABLET | Freq: Every morning | ORAL | Status: DC
  Administered 2024-03-04 – 2024-03-10 (×7): 300 mg via ORAL
  Filled 2024-03-04 (×7): qty 2

## 2024-03-04 MED ORDER — KCL IN DEXTROSE-NACL 20-5-0.45 MEQ/L-%-% IV SOLN
INTRAVENOUS | Status: AC
  Filled 2024-03-04: qty 1000

## 2024-03-04 MED ORDER — ENSURE PLUS HIGH PROTEIN PO LIQD
237.0000 mL | Freq: Two times a day (BID) | ORAL | Status: DC
  Administered 2024-03-04 – 2024-03-10 (×10): 237 mL via ORAL

## 2024-03-04 MED ORDER — LACTATED RINGERS IV BOLUS
1000.0000 mL | Freq: Once | INTRAVENOUS | Status: AC
  Administered 2024-03-04: 1000 mL via INTRAVENOUS

## 2024-03-04 MED ORDER — LIDOCAINE 5 % EX PTCH
2.0000 | MEDICATED_PATCH | Freq: Every day | CUTANEOUS | Status: DC
  Administered 2024-03-04 – 2024-03-08 (×3): 2 via TRANSDERMAL
  Filled 2024-03-04 (×7): qty 2

## 2024-03-04 MED ORDER — METHOCARBAMOL 500 MG PO TABS
500.0000 mg | ORAL_TABLET | Freq: Three times a day (TID) | ORAL | Status: DC
  Administered 2024-03-04 – 2024-03-10 (×17): 500 mg via ORAL
  Filled 2024-03-04 (×18): qty 1

## 2024-03-04 MED ORDER — PANTOPRAZOLE SODIUM 40 MG PO TBEC
40.0000 mg | DELAYED_RELEASE_TABLET | Freq: Every day | ORAL | Status: DC
  Administered 2024-03-04 – 2024-03-05 (×2): 40 mg via ORAL
  Filled 2024-03-04 (×2): qty 1

## 2024-03-04 NOTE — Progress Notes (Addendum)
 Notified on call for [facility] (2300) that pt reports swelling of vulva after bolus earlier today. I assessed and it is red, firm and tender. Redness/firmness reaches to under incision on left side abdomen.  Gave ice pack At 2245 I attempted bladder scan, (data was 0ml and 18ml) an notified provider. Ordered I&O.

## 2024-03-04 NOTE — Progress Notes (Addendum)
 [facility] Progress Note  1 Day Post-Op  Subjective: CC:  Cc is lower abdominal pressure, like she needs to void but cannot. Reports mild abdominal discomfort with movement/coughing. Had one episode of emesis this morning but denies nausea at present. Denies flatus or BM.  Reports having a head cold last week and now feels like its going to her chest. At baseline she swims daily and golfs most days. Lives at [facility].  Objective: Vital signs in last 24 hours: Temp:  [97.5 F (36.4 C)-99.3 F (37.4 C)] 98.4 F (36.9 C) (07/09 0915) Pulse Rate:  [67-81] 78 (07/09 0915) Resp:  [13-29] 15 (07/09 0915) BP: (100-154)/(43-72) 116/51 (07/09 0915) SpO2:  [93 %-97 %] 95 % (07/09 0915)    Intake/Output from previous day: 07/08 0701 - 07/09 0700 In: 1062.4 [I.V.:54.9; IV Piggyback:1007.5] Out: 60 [Blood:10] Intake/Output this shift: No intake/output data recorded.  PE: Gen:  Alert, NAD, pleasant Card:  Regular rate and rhythm, no lower extremity edema  Pulm:  Normal effort ORA, coarse upper airway sounds and wet cough, CTAB Abd: Soft, appropriately tender, mild distention, periumbilical and LLQ incisions c/d/i Skin: warm and dry, no rashes  Psych: A&Ox3   Lab Results:  Recent Labs    03/02/24 2228 03/04/24 0549  WBC 7.4 6.4  HGB 13.0 11.9*  HCT 39.2 35.8*  PLT 231 207   BMET Recent Labs    03/02/24 2228 03/04/24 0549  NA 138 131*  K 4.1 4.2  CL 102 102  CO2 22 23  GLUCOSE 133* 137*  BUN 15 15  CREATININE 0.91 1.51*  CALCIUM 9.9 8.1*   PT/INR No results for input(s): LABPROT, INR in the last 72 hours. CMP     Component Value Date/Time   NA 131 (L) 03/04/2024 0549   NA 139 11/11/2015 0000   K 4.2 03/04/2024 0549   CL 102 03/04/2024 0549   CO2 23 03/04/2024 0549   GLUCOSE 137 (H) 03/04/2024 0549   BUN 15 03/04/2024 0549   BUN 20 11/11/2015 0000   CREATININE 1.51 (H) 03/04/2024 0549   CREATININE 0.88 05/27/2017 1417   CALCIUM 8.1 (L)  03/04/2024 0549   PROT 7.4 03/02/2024 2228   ALBUMIN 4.4 03/02/2024 2228   AST 14 (L) 03/02/2024 2228   ALT 12 03/02/2024 2228   ALKPHOS 59 03/02/2024 2228   BILITOT 0.4 03/02/2024 2228   GFRNONAA 34 (L) 03/04/2024 0549   GFRNONAA 64 05/27/2017 1417   GFRAA 74 05/27/2017 1417   Lipase     Component Value Date/Time   LIPASE 21 03/02/2024 2228       Studies/Results: CT ABDOMEN PELVIS W CONTRAST Result Date: 03/03/2024 CLINICAL DATA:  Bowel obstruction suspected. Left inguinal hernia. Lower abdominal and pelvic pain. Difficulty urinating. Nausea and vomiting. EXAM: CT ABDOMEN AND PELVIS WITH CONTRAST TECHNIQUE: Multidetector CT imaging of the abdomen and pelvis was performed using the standard protocol following bolus administration of intravenous contrast. RADIATION DOSE REDUCTION: This exam was performed according to the departmental dose-optimization program which includes automated exposure control, adjustment of the mA and/or kV according to patient size and/or use of iterative reconstruction technique. CONTRAST:  85mL OMNIPAQUE  IOHEXOL  300 MG/ML  SOLN COMPARISON:  Ultrasound abdomen 08/09/2022 FINDINGS: Lower chest: Lung bases are clear. Hepatobiliary: No focal liver abnormality is seen. No gallstones, gallbladder wall thickening, or biliary dilatation. Pancreas: Unremarkable. No pancreatic ductal dilatation or surrounding inflammatory changes. Spleen: Normal in size without focal abnormality. Adrenals/Urinary Tract: Adrenal glands are unremarkable. Kidneys are  normal, without renal calculi, focal lesion, or hydronephrosis. Bladder is unremarkable. Stomach/Bowel: Small left inguinal hernia containing a loop of small bowel with proximal dilated bowel loops with air-fluid levels consistent with proximal obstruction. Remainder of the small bowel, stomach, and colon are decompressed. No bowel wall thickening, pneumatosis, or portal venous gas. Appendix is normal. Vascular/Lymphatic: Aortic  atherosclerosis. No enlarged abdominal or pelvic lymph nodes. Reproductive: Status post hysterectomy. No adnexal masses. Other: No free air or free fluid in the abdomen. Musculoskeletal: Degenerative changes in the spine. No acute bony abnormalities. IMPRESSION: 1. Left inguinal hernia containing small bowel with left lower quadrant anterior dilated small bowel with gas and fluid levels consistent with small-bowel obstruction. No ischemic changes are identified. 2. Aortic atherosclerosis. Electronically Signed   By: Elsie Gravely M.D.   On: 03/03/2024 02:50    Anti-infectives: Anti-infectives (From admission, onward)    Start     Dose/Rate Route Frequency Ordered Stop   03/03/24 1400  piperacillin -tazobactam (ZOSYN ) IVPB 3.375 g        3.375 g 12.5 mL/hr over 240 Minutes Intravenous Every 8 hours 03/03/24 1259 03/04/24 1359   03/03/24 0645  ceFAZolin  (ANCEF ) IVPB 2g/100 mL premix        2 g 200 mL/hr over 30 Minutes Intravenous On call to O.R. 03/03/24 0640 03/03/24 0740        Assessment/Plan  Incarcerated left femoral hernia  POD#1 s/p diagnostic laparoscopy, left femoral hernia repair with mesh, SBR with primary anastomosis 7/8 Dr. Ann - AFVSS,  WBC 6.4, hgb 11.9 from 13.0  - may have a mild ileus, KUB w/ non-obstructive bowel gas pattern this morning. - start mucinex  for wet cough/congestion, IS and flutter valve ordered   AKI - BUN 15/ Cr 1.51 from 15/0.91 yesterday; suspect dehydration/pre-renal etiology. Patient with history of poor po intake last 48 hours and decreased frequency of urination. Give 500 mL bolus and continue IVF at 75 mL/hr today. Re-check BMP in AM.   FEN: allow FLD and monitor nausea, IVF @ 75 mL/hr ID: none at present VTE: SCD's, Lovenox  Foley: none, spont voids, D/C purewick Dispo: med-surg, await bowel function   Re-ordered patients home wellbutrin  and ambien  at bedtime PRN.   LOS: 1 day   I reviewed nursing notes, last 24 h vitals and pain  scores, last 48 h intake and output, last 24 h labs and trends, and last 24 h imaging results.  This care required straight-forward level of medical decision making.   Almarie Pringle, PA-C [facility] Please see Amion for pager number during day hours 7:00am-4:30pm

## 2024-03-04 NOTE — Progress Notes (Signed)
 Pt complaining of not urinating last night ad states that bladder feels full. NT did bladder scan on pt. 80mL in bladder

## 2024-03-04 NOTE — Discharge Instructions (Signed)
 [facility]      [facility], [place] [phone]  OPEN ABDOMINAL SURGERY: POST OP INSTRUCTIONS  Always review your discharge instruction sheet given to you by the facility where your surgery was performed.  IF YOU HAVE DISABILITY OR FAMILY LEAVE FORMS, YOU MUST BRING THEM TO THE OFFICE FOR PROCESSING.  PLEASE DO NOT GIVE THEM TO YOUR DOCTOR.  A prescription for pain medication may be given to you upon discharge.  Take your pain medication as prescribed, if needed.  If narcotic pain medicine is not needed, then you may take acetaminophen (Tylenol) or ibuprofen (Advil) as needed. Take your usually prescribed medications unless otherwise directed. If you need a refill on your pain medication, please contact your pharmacy. They will contact our office to request authorization.  Prescriptions will not be filled after 5pm or on week-ends. You should follow a light diet the first few days after arrival home, such as soup and crackers, pudding, etc.unless your doctor has advised otherwise. A high-fiber, low fat diet can be resumed as tolerated.   Be sure to include lots of fluids daily. Most patients will experience some swelling and bruising on the chest and neck area.  Ice packs will help.  Swelling and bruising can take several days to resolve Most patients will experience some swelling and bruising in the area of the incision. Ice pack will help. Swelling and bruising can take several days to resolve..  It is common to experience some constipation if taking pain medication after surgery.  Increasing fluid intake and taking a stool softener will usually help or prevent this problem from occurring.  A mild laxative (Milk of Magnesia or Miralax) should be taken according to package directions if there are no bowel movements after 48 hours.  You may have steri-strips (small skin tapes) in place directly over the incision.  These strips should be left on the skin for 7-10 days.  If your surgeon used skin  glue on the incision, you may shower in 24 hours.  The glue will flake off over the next 2-3 weeks.  Any sutures or staples will be removed at the office during your follow-up visit. You may find that a light gauze bandage over your incision may keep your staples from being rubbed or pulled. You may shower and replace the bandage daily. ACTIVITIES:  You may resume regular (light) daily activities beginning the next day--such as daily self-care, walking, climbing stairs--gradually increasing activities as tolerated.  You may have sexual intercourse when it is comfortable.  Refrain from any heavy lifting or straining until approved by your doctor. You may drive when you no longer are taking prescription pain medication, you can comfortably wear a seatbelt, and you can safely maneuver your car and apply brakes Return to Work: ___________________________________ Bonita Quin should see your doctor in the office for a follow-up appointment approximately two weeks after your surgery.  Make sure that you call for this appointment within a day or two after you arrive home to insure a convenient appointment time. OTHER INSTRUCTIONS:  _____________________________________________________________ _____________________________________________________________  WHEN TO CALL YOUR DOCTOR: Fever over 101.0 Inability to urinate Nausea and/or vomiting Extreme swelling or bruising Continued bleeding from incision. Increased pain, redness, or drainage from the incision. Difficulty swallowing or breathing Muscle cramping or spasms. Numbness or tingling in hands or feet or around lips.  The clinic staff is available to answer your questions during regular business hours.  Please don't hesitate to call and ask to speak to one of  the nurses if you have concerns.  For further questions, please visit [email or web address]

## 2024-03-04 NOTE — Progress Notes (Signed)
 Pt complaining of pain and vomiting. PRN Dilaudid  and Zofran  given to pt

## 2024-03-04 NOTE — Evaluation (Signed)
 Physical Therapy Evaluation Patient Details Name: Jillian Norton MRN: [id number] DOB: 1941-05-14 Today's Date: 03/04/2024  History of Present Illness  Pt is a [age] y/o active F s/p diagnostic laparoscopy, left femoral hernia repair with mesh. PMH unremarkable  Clinical Impression  Received pt semi-reclined in bed with daughter and significant other at bedside. Pt lives at [facility] and wishes to return there to receive rehab services. 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 Pt politely declined OOB mobility due to pain and requested to let pain subside before doing therapy - educated on benefits of early mobility post surgery but pt continued to decline. Pt did report ambulating to/from bathroom earlier without AD with assist from staff - anticipate pt to be min/light mod A with ambulation without AD. Recommend therapy <3 hours/day to maximize functional mobility and independence. Acute PT to cont to follow.       If plan is discharge home, recommend the following: A little help with walking and/or transfers;A little help with bathing/dressing/bathroom;Help with stairs or ramp for entrance;Assist for transportation   Can travel by private vehicle   Yes    Equipment Recommendations Other (comment) (TBD)  Recommendations for Other Services       Functional Status Assessment Patient has had a recent decline in their functional status and demonstrates the ability to make significant improvements in function in a reasonable and predictable amount of time.     Precautions / Restrictions Precautions Precautions: Fall Restrictions Weight Bearing Restrictions Per Provider Order: No      Mobility  Bed Mobility               General bed mobility comments: pt politely refused OOB mobility Patient Response: Cooperative  Transfers                   General transfer comment: pt politely refused  OOB mobility, reporting getting up earlier and walking to bathroom without AD with assist from staff    Ambulation/Gait               General Gait Details: pt politely refused OOB mobility, reporting getting up earlier and walking to bathroom without AD with assist from staff  Stairs            Wheelchair Mobility     Tilt Bed Tilt Bed Patient Response: Cooperative  Modified Rankin (Stroke Patients Only)       Balance Overall balance assessment: Mild deficits observed, not formally tested                                           Pertinent Vitals/Pain Pain Assessment Pain Assessment: 0-10 Pain Score: 7  Pain Location: abdomen Pain Descriptors / Indicators: Aching, Discomfort, Grimacing, Guarding, Sore Pain Intervention(s): Limited activity within patient's tolerance, Monitored during session, Premedicated before session    Home Living Family/patient expects to be discharged to:: Private residence Living Arrangements: Alone (lives alone but has significant other who lives at [place]) Available Help at Discharge: Neighbor;Friend(s);Available PRN/intermittently (has PT/OT at [facility]) Type of Home: Apartment Home Access: Level entry       Home Layout: One level Home Equipment: None      Prior Function Prior Level of Function : Independent/Modified Independent             Mobility  Comments: Independent without AD prior to admission. Active golfer at [employer]       Extremity/Trunk Assessment   Upper Extremity Assessment Upper Extremity Assessment: Defer to OT evaluation    Lower Extremity Assessment Lower Extremity Assessment: Generalized weakness    Cervical / Trunk Assessment Cervical / Trunk Assessment: Normal  Communication   Communication Communication: No apparent difficulties Factors Affecting Communication: Hearing impaired    Cognition Arousal: Alert Behavior During Therapy: WFL for tasks  assessed/performed   PT - Cognitive impairments: No apparent impairments                         Following commands: Intact       Cueing       General Comments General comments (skin integrity, edema, etc.): Pt reported balance was off when ambulating to/from bathroom earlier due to pain    Exercises     Assessment/Plan    PT Assessment Patient needs continued PT services  PT Problem List Decreased strength;Decreased activity tolerance;Decreased balance;Decreased mobility;Pain       PT Treatment Interventions DME instruction;Gait training;Stair training;Functional mobility training;Therapeutic activities;Patient/family education;Neuromuscular re-education;Balance training;Therapeutic exercise    PT Goals (Current goals can be found in the Care Plan section)  Acute Rehab PT Goals Patient Stated Goal: to return to [facility] and recieve rehab there PT Goal Formulation: With patient/family Time For Goal Achievement: 03/18/24 Potential to Achieve Goals: Good    Frequency Min 3X/week     Co-evaluation               AM-PAC PT 6 Clicks Mobility  Outcome Measure                  End of Session   Activity Tolerance: Patient limited by pain Patient left: in bed;with call bell/phone within reach;with bed alarm set;with family/visitor present Nurse Communication: Mobility status PT Visit Diagnosis: Unsteadiness on feet (R26.81);Other abnormalities of gait and mobility (R26.89);Muscle weakness (generalized) (M62.81);Pain Pain - Right/Left:  (abdomen) Pain - part of body:  (abdomen)    Time: 8966-8952 PT Time Calculation (min) (ACUTE ONLY): 14 min   Charges:   PT Evaluation $PT Eval Low Complexity: 1 Low   PT General Charges $$ ACUTE PT VISIT: 1 Visit         Therisa Stains PT, DPT Therisa HERO Zaunegger 03/04/2024, 11:21 AM

## 2024-03-04 NOTE — TOC Initial Note (Signed)
 Transition of Care [facility]) - Initial/Assessment Note    Patient Details  Name: Analeya Luallen MRN: [id number] Date of Birth: Sep 11, 1940  [facility] CM/SW Contact:    Luise JAYSON Pan, LCSWA Phone Number: 03/04/2024, 4:08 PM  Clinical Narrative:   CSW spoke with patients daughter, Camie, about plans for discharge. At this time, Camie would like CSW to follow up as she is not sure if the plan will be for patient to discharge back to [facility] or to the rehab side. CSW will follow up.  TOC will continue to follow.                Expected Discharge Plan:  (ILF vs SNF) Barriers to Discharge: Continued Medical Work up   Patient Goals and CMS Choice Patient states their goals for this hospitalization and ongoing recovery are:: To get better          Expected Discharge Plan and Services In-house Referral: Clinical Social Work Discharge Planning Services: CM Consult   Living arrangements for the past 2 months: Independent Living Facility                                      Prior Living Arrangements/Services Living arrangements for the past 2 months: Independent Living Facility Lives with:: Self Patient language and need for interpreter reviewed:: Yes Do you feel safe going back to the place where you live?: Yes      Need for Family Participation in Patient Care: Yes (Comment) Care giver support system in place?: Yes (comment)   Criminal Activity/Legal Involvement Pertinent to Current Situation/Hospitalization: No - Comment as needed  Activities of Daily Living   ADL Screening (condition at time of admission) Independently performs ADLs?: Yes (appropriate for developmental age) Is the patient deaf or have difficulty hearing?: No Does the patient have difficulty seeing, even when wearing glasses/contacts?: No Does the patient have difficulty concentrating, remembering, or making decisions?: No  Permission Sought/Granted Permission sought to share  information with : Facility Medical sales representative, Family Supports Permission granted to share information with :  (Advance directives on chart)  Share Information with NAME: Camie  Permission granted to share info w AGENCY: [facility]  Permission granted to share info w Relationship: dtr  Permission granted to share info w Contact Information: [phone]  Emotional Assessment Appearance:: Appears stated age Attitude/Demeanor/Rapport: Unable to Assess Affect (typically observed): Unable to Assess Orientation: : Oriented to Self, Oriented to Place, Oriented to  Time, Oriented to Situation Alcohol  / Substance Use: Not Applicable Psych Involvement: No (comment)  Admission diagnosis:  Non-recurrent unilateral inguinal hernia with obstruction without gangrene [K40.30] 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 [K41.30] Patient Active Problem List   Diagnosis Date Noted   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 03/03/2024   Arthritis of carpometacarpal [facility]) joint of left thumb 03/05/2018   De Quervain's tenosynovitis, right 03/05/2018   Malignant neoplasm of upper-inner quadrant of right breast in female, estrogen receptor positive (HCC) 10/24/2017   Overweight (BMI 25.0-29.9) 04/07/2017   History of cancer of right breast 04/07/2017   History of DVT (deep vein thrombosis) 04/07/2017   GERD (gastroesophageal reflux disease) 11/01/2016   Primary insomnia 11/01/2016   Urge incontinence 11/01/2016   History of colon polyps 11/01/2016   Postnasal drip 11/01/2016   Shingles 06/27/2016   PCP:  [name], MD Pharmacy:   [facility] [id number] -  [place], [place] - [address] [place] [address] Phone: [phone] Fax: [phone]  [facility] [place] [facility] [address] [place] [address] Phone: [phone] Fax: [phone]     Social Drivers of Health (SDOH) Social History: SDOH Screenings    Food Insecurity: No Food Insecurity (03/03/2024)  Housing: Unknown (03/03/2024)  Transportation Needs: No Transportation Needs (03/03/2024)  Utilities: Not At Risk (03/03/2024)  Social Connections: Moderately Isolated (03/03/2024)  Tobacco Use: Medium Risk (03/03/2024)   SDOH Interventions:     Readmission Risk Interventions     No data to display

## 2024-03-04 NOTE — Progress Notes (Signed)
 Made  night nurse Amy aware that pt urine needs to be measured and documented every time she pees

## 2024-03-05 ENCOUNTER — Inpatient Hospital Stay (HOSPITAL_COMMUNITY)

## 2024-03-05 LAB — BASIC METABOLIC PANEL WITH GFR
Anion gap: 13 (ref 5–15)
BUN: 28 mg/dL — ABNORMAL HIGH (ref 8–23)
CO2: 18 mmol/L — ABNORMAL LOW (ref 22–32)
Calcium: 8.9 mg/dL (ref 8.9–10.3)
Chloride: 101 mmol/L (ref 98–111)
Creatinine, Ser: 3.38 mg/dL — ABNORMAL HIGH (ref 0.44–1.00)
GFR, Estimated: 13 mL/min — ABNORMAL LOW (ref >60)
Glucose, Bld: 116 mg/dL — ABNORMAL HIGH (ref 70–99)
Potassium: 4.9 mmol/L (ref 3.5–5.1)
Sodium: 132 mmol/L — ABNORMAL LOW (ref 135–145)

## 2024-03-05 LAB — URINALYSIS, ROUTINE W REFLEX MICROSCOPIC
Bilirubin Urine: NEGATIVE
Glucose, UA: NEGATIVE mg/dL
Ketones, ur: NEGATIVE mg/dL
Nitrite: NEGATIVE
Protein, ur: 30 mg/dL — AB
RBC / HPF: >50 RBC/hpf (ref 0–5)
Specific Gravity, Urine: 1.013 (ref 1.005–1.030)
pH: 5 (ref 5.0–8.0)

## 2024-03-05 LAB — CBC
HCT: 36.2 % (ref 36.0–46.0)
Hemoglobin: 12 g/dL (ref 12.0–15.0)
MCH: 30.6 pg (ref 26.0–34.0)
MCHC: 33.1 g/dL (ref 30.0–36.0)
MCV: 92.3 fL (ref 80.0–100.0)
Platelets: 227 K/uL (ref 150–400)
RBC: 3.92 MIL/uL (ref 3.87–5.11)
RDW: 13.2 % (ref 11.5–15.5)
WBC: 8.1 K/uL (ref 4.0–10.5)
nRBC: 0 % (ref 0.0–0.2)

## 2024-03-05 LAB — RENAL FUNCTION PANEL
Albumin: 3 g/dL — ABNORMAL LOW (ref 3.5–5.0)
Anion gap: 11 (ref 5–15)
BUN: 27 mg/dL — ABNORMAL HIGH (ref 8–23)
CO2: 17 mmol/L — ABNORMAL LOW (ref 22–32)
Calcium: 9.1 mg/dL (ref 8.9–10.3)
Chloride: 107 mmol/L (ref 98–111)
Creatinine, Ser: 3.03 mg/dL — ABNORMAL HIGH (ref 0.44–1.00)
GFR, Estimated: 15 mL/min — ABNORMAL LOW (ref >60)
Glucose, Bld: 96 mg/dL (ref 70–99)
Phosphorus: 4 mg/dL (ref 2.5–4.6)
Potassium: 4.5 mmol/L (ref 3.5–5.1)
Sodium: 135 mmol/L (ref 135–145)

## 2024-03-05 LAB — SODIUM, URINE, RANDOM: Sodium, Ur: 72 mmol/L

## 2024-03-05 LAB — PHOSPHORUS: Phosphorus: 4.7 mg/dL — ABNORMAL HIGH (ref 2.5–4.6)

## 2024-03-05 LAB — MAGNESIUM: Magnesium: 1.8 mg/dL (ref 1.7–2.4)

## 2024-03-05 MED ORDER — CHLORHEXIDINE GLUCONATE CLOTH 2 % EX PADS
6.0000 | MEDICATED_PAD | Freq: Every day | CUTANEOUS | Status: DC
  Administered 2024-03-05 – 2024-03-09 (×5): 6 via TOPICAL

## 2024-03-05 MED ORDER — PIPERACILLIN-TAZOBACTAM IN DEX 2-0.25 GM/50ML IV SOLN
2.2500 g | Freq: Three times a day (TID) | INTRAVENOUS | Status: AC
  Administered 2024-03-05 – 2024-03-06 (×3): 2.25 g via INTRAVENOUS
  Filled 2024-03-05 (×5): qty 50

## 2024-03-05 MED ORDER — PIPERACILLIN-TAZOBACTAM 3.375 G IVPB
3.3750 g | Freq: Three times a day (TID) | INTRAVENOUS | Status: DC
  Filled 2024-03-05: qty 50

## 2024-03-05 MED ORDER — PANTOPRAZOLE SODIUM 40 MG IV SOLR
40.0000 mg | INTRAVENOUS | Status: DC
  Administered 2024-03-06: 40 mg via INTRAVENOUS
  Filled 2024-03-05: qty 10

## 2024-03-05 NOTE — Progress Notes (Signed)
 Patient decided not to allow the NG-Tube insertion due to feeling a 100% better post foley insertion and bladder evacuation. Patient educated and made aware of her right and that if she begin to feel Nausea that the tube could be inserted at a later time. Night nurse will be informed that the order is in for placement shall the patient need it.

## 2024-03-05 NOTE — Plan of Care (Signed)
  Problem: Elimination: Goal: Will not experience complications related to bowel motility Outcome: Progressing   Problem: Coping: Goal: Level of anxiety will decrease Outcome: Progressing   Problem: Activity: Goal: Risk for activity intolerance will decrease Outcome: Progressing   Problem: Elimination: Goal: Will not experience complications related to urinary retention Outcome: Progressing   Problem: Pain Managment: Goal: General experience of comfort will improve and/or be controlled Outcome: Progressing

## 2024-03-05 NOTE — Progress Notes (Signed)
 [facility] Progress Note  2 Days Post-Op  Subjective: CC:  Abdominal bloating and poor PO intake. No flatus or BM. Reports pelvic and labial swelling. Reports remote history of cyst on her labia. Denies being sexually active or possibility of STI. Required I&O last night for retention ( with sediment reported)  Objective: Vital signs in last 24 hours: Temp:  [97.5 F (36.4 C)-98.5 F (36.9 C)] 98.2 F (36.8 C) (07/10 0823) Pulse Rate:  [73-82] 75 (07/10 0823) Resp:  [15-19] 17 (07/10 0823) BP: (117-159)/(63-88) 117/63 (07/10 0823) SpO2:  [97 %-98 %] 98 % (07/10 0823) Last BM Date :  (PTA)  Intake/Output from previous day: 07/09 0701 - 07/10 0700 In: 1314.5 [P.O.:140; I.V.:173.4; IV Piggyback:1001.1] Out: 325 [Urine:325] Intake/Output this shift: No intake/output data recorded.  PE: Gen:  Alert, NAD, pleasant Card:  Regular rate and rhythm, no lower extremity edema  Pulm:  Normal effort ORA, coarse upper airway sounds and wet cough - improved compared to yesterday, CTAB Abd: Soft, tender across lower abdomen and periumbilical region without guarding. LLQ surgical incision c/d/I - there is some edema of the supratubic region as well as some fainy erythema inferior to surgical incision GU: there is erythema of the labia bilaterally, with more swelling and some induration on the left side Skin: warm and dry, no rashes  Psych: A&Ox3   Lab Results:  Recent Labs    03/04/24 0549 03/05/24 0647  WBC 6.4 8.1  HGB 11.9* 12.0  HCT 35.8* 36.2  PLT 207 227   BMET Recent Labs    03/02/24 2228 03/04/24 0549  NA 138 131*  K 4.1 4.2  CL 102 102  CO2 22 23  GLUCOSE 133* 137*  BUN 15 15  CREATININE 0.91 1.51*  CALCIUM 9.9 8.1*   PT/INR No results for input(s): LABPROT, INR in the last 72 hours. CMP     Component Value Date/Time   NA 131 (L) 03/04/2024 0549   NA 139 11/11/2015 0000   K 4.2 03/04/2024 0549   CL 102 03/04/2024 0549   CO2 23  03/04/2024 0549   GLUCOSE 137 (H) 03/04/2024 0549   BUN 15 03/04/2024 0549   BUN 20 11/11/2015 0000   CREATININE 1.51 (H) 03/04/2024 0549   CREATININE 0.88 05/27/2017 1417   CALCIUM 8.1 (L) 03/04/2024 0549   PROT 7.4 03/02/2024 2228   ALBUMIN 4.4 03/02/2024 2228   AST 14 (L) 03/02/2024 2228   ALT 12 03/02/2024 2228   ALKPHOS 59 03/02/2024 2228   BILITOT 0.4 03/02/2024 2228   GFRNONAA 34 (L) 03/04/2024 0549   GFRNONAA 64 05/27/2017 1417   GFRAA 74 05/27/2017 1417   Lipase     Component Value Date/Time   LIPASE 21 03/02/2024 2228       Studies/Results: DG Abd Portable 1V Result Date: 03/04/2024 CLINICAL DATA:  [id number] Vomiting [id number].  Abdomen pain. EXAM: PORTABLE ABDOMEN - 1 VIEW COMPARISON:  None Available. FINDINGS: The bowel gas pattern is non-obstructive.  No abnormal stool burden. No evidence of pneumoperitoneum, within the limitations of a supine film. No acute osseous abnormalities. The soft tissues are within normal limits. Surgical changes, devices, tubes and lines: None. IMPRESSION: Nonobstructive bowel gas pattern. Electronically Signed   By: Ree Molt M.D.   On: 03/04/2024 12:00    Anti-infectives: Anti-infectives (From admission, onward)    Start     Dose/Rate Route Frequency Ordered Stop   03/05/24 1130  piperacillin -tazobactam (ZOSYN ) IVPB 3.375 g  3.375 g 12.5 mL/hr over 240 Minutes Intravenous Every 8 hours 03/05/24 1112 03/06/24 1359   03/03/24 1400  piperacillin -tazobactam (ZOSYN ) IVPB 3.375 g        3.375 g 12.5 mL/hr over 240 Minutes Intravenous Every 8 hours 03/03/24 1259 03/05/24 0751   03/03/24 0645  ceFAZolin  (ANCEF ) IVPB 2g/100 mL premix        2 g 200 mL/hr over 30 Minutes Intravenous On call to O.R. 03/03/24 0640 03/03/24 0740        Assessment/Plan  Incarcerated left femoral hernia  POD#2 s/p diagnostic laparoscopy, left femoral hernia repair with mesh, SBR with primary anastomosis 7/8 Dr. Ann - AFVSS,  WBC 8.1, hgb stable at  12.0 - ileus clinically as well as on KUB this morning - place NGT - place foley for retention - once BMP results, if creatinine normal, CT A/P w/ IV contrast for suprapubic/abdominal wall swelling post-operatively as well as clinical concern for labial abscess.  AKI - BMP pending today. BUN 15/ Cr 1.51 yesterday from 15/0.91; suspect dehydration/pre-renal etiology  FEN: NPO, NGT LIWS, IVF @ 75 mL/hr ID: Zosyn  resumed 7/10 due to clinical concern for abscess as above  VTE: SCD's, Lovenox  Foley: placed 7/10 for retention Dispo: med-surg, await bowel function   Re-ordered patients home wellbutrin  and ambien  at bedtime PRN.   LOS: 2 days   I reviewed nursing notes, last 24 h vitals and pain scores, last 48 h intake and output, last 24 h labs and trends, and last 24 h imaging results.  This care required straight-forward level of medical decision making.   Jillian Pringle, PA-C [facility] Please see Amion for pager number during day hours 7:00am-4:30pm

## 2024-03-05 NOTE — Progress Notes (Signed)
 PT Cancellation Note  Patient Details Name: Jillian Norton MRN: [id number] DOB: 20-Oct-1940   Cancelled Treatment:    Reason Eval/Treat Not Completed: (P) Other (comment), pt declining all mobility stating multiple procedures lined up for this AM and she would like to rest. Pt agreeable for this PTA to return in PM. Will check back as schedule allows to continue with PT POC.  Therisa SAUNDERS. PTA Acute Rehabilitation Services Office: [phone]    Therisa CHRISTELLA Boor 03/05/2024, 11:23 AM

## 2024-03-06 LAB — BASIC METABOLIC PANEL WITH GFR
Anion gap: 11 (ref 5–15)
BUN: 22 mg/dL (ref 8–23)
CO2: 19 mmol/L — ABNORMAL LOW (ref 22–32)
Calcium: 9.2 mg/dL (ref 8.9–10.3)
Chloride: 106 mmol/L (ref 98–111)
Creatinine, Ser: 2.02 mg/dL — ABNORMAL HIGH (ref 0.44–1.00)
GFR, Estimated: 24 mL/min — ABNORMAL LOW (ref >60)
Glucose, Bld: 98 mg/dL (ref 70–99)
Potassium: 4.6 mmol/L (ref 3.5–5.1)
Sodium: 136 mmol/L (ref 135–145)

## 2024-03-06 LAB — CBC
HCT: 35.2 % — ABNORMAL LOW (ref 36.0–46.0)
Hemoglobin: 12 g/dL (ref 12.0–15.0)
MCH: 30.5 pg (ref 26.0–34.0)
MCHC: 34.1 g/dL (ref 30.0–36.0)
MCV: 89.3 fL (ref 80.0–100.0)
Platelets: 244 K/uL (ref 150–400)
RBC: 3.94 MIL/uL (ref 3.87–5.11)
RDW: 13.5 % (ref 11.5–15.5)
WBC: 7.4 K/uL (ref 4.0–10.5)
nRBC: 0 % (ref 0.0–0.2)

## 2024-03-06 LAB — PHOSPHORUS: Phosphorus: 3 mg/dL (ref 2.5–4.6)

## 2024-03-06 LAB — MAGNESIUM: Magnesium: 2 mg/dL (ref 1.7–2.4)

## 2024-03-06 LAB — OSMOLALITY, URINE: Osmolality, Ur: 395 mosm/kg (ref 300–900)

## 2024-03-06 MED ORDER — SODIUM CHLORIDE 0.9 % IV SOLN
2.0000 g | INTRAVENOUS | Status: AC
  Administered 2024-03-06 – 2024-03-07 (×2): 2 g via INTRAVENOUS
  Filled 2024-03-06 (×2): qty 20

## 2024-03-06 MED ORDER — PANTOPRAZOLE SODIUM 40 MG IV SOLR: 40.0000 mg | INTRAVENOUS | Status: DC

## 2024-03-06 MED ORDER — ORAL CARE MOUTH RINSE
15.0000 mL | OROMUCOSAL | Status: DC | PRN
Start: 2024-03-06 — End: 2024-03-10

## 2024-03-06 MED ORDER — PANTOPRAZOLE SODIUM 40 MG PO TBEC
40.0000 mg | DELAYED_RELEASE_TABLET | ORAL | Status: DC
  Administered 2024-03-07 – 2024-03-10 (×4): 40 mg via ORAL
  Filled 2024-03-06 (×4): qty 1

## 2024-03-06 NOTE — Plan of Care (Signed)
   Problem: Education: Goal: Knowledge of General Education information will improve Description Including pain rating scale, medication(s)/side effects and non-pharmacologic comfort measures Outcome: Progressing

## 2024-03-06 NOTE — Progress Notes (Signed)
 Physical Therapy Treatment Patient Details Name: Jillian Norton MRN: [id number] DOB: [birth year] Today's Date: 03/06/2024   History of Present Illness [age] a [age] y/o active F s/p diagnostic laparoscopy, left femoral hernia repair with mesh. PMH unremarkable    PT Comments  Pt resting in bed on arrival and agreeable to session with encouragement. Pt with good progress towards acute goals and giving good effort for transfer and gait training. Pt demonstrating transfers and gait with RW for support with grossly CGA for safety with no overt LOB noted with light cues for closer RW proximity and upright posture. Educated pt on importance of continued mobility to maintain strength and activity tolerance with pt verbalizing understanding. Pt up in bouncing gas chair at end of session with all needs met. Pt continues to benefit from skilled PT services to progress toward functional mobility goals.     If plan is discharge home, recommend the following: A little help with walking and/or transfers;A little help with bathing/dressing/bathroom;Help with stairs or ramp for entrance;Assist for transportation   Can travel by private vehicle     Yes  Equipment Recommendations  Other (comment) (TBD)    Recommendations for Other Services       Precautions / Restrictions Precautions Precautions: Fall Restrictions Weight Bearing Restrictions Per Provider Order: No     Mobility  Bed Mobility Overal bed mobility: Needs Assistance Bed Mobility: Rolling, Sidelying to Sit Rolling: Contact guard assist Sidelying to sit: Mod assist       General bed mobility comments: educated pt on log roll technique for comfort, pt rolling to R with CGA and cues for sequencing, mod A to elevate trunk from sidelying due to pain    Transfers Overall transfer level: Needs assistance Equipment used: Rolling walker (2 wheels) Transfers: Sit to/from Stand Sit to Stand: Contact guard assist           General  transfer comment: pt needing cues for hand placement as pt initially attempting to pull up on RW. good UE use and steady rise to stand, able to stand from bouncing chair without AD    Ambulation/Gait Ambulation/Gait assistance: Contact guard assist Gait Distance (Feet): 500 Feet Assistive device: Rolling walker (2 wheels) Gait Pattern/deviations: Step-to pattern Gait velocity: decr     General Gait Details: light cues for upright posture and RW proximity, pt ambulating with reciprocal gait and no LOB   Stairs             Wheelchair Mobility     Tilt Bed    Modified Rankin (Stroke Patients Only)       Balance Overall balance assessment: Mild deficits observed, not formally tested                                          Communication Communication Communication: No apparent difficulties Factors Affecting Communication: Hearing impaired  Cognition Arousal: Alert Behavior During Therapy: WFL for tasks assessed/performed   PT - Cognitive impairments: No apparent impairments                         Following commands: Intact      Cueing    Exercises      General Comments        Pertinent Vitals/Pain Pain Assessment Pain Assessment: Faces Faces Pain Scale: Hurts little more Pain Location: abdomen with mobility Pain  Descriptors / Indicators: Aching, Discomfort, Grimacing, Guarding, Sore Pain Intervention(s): Monitored during session, Limited activity within patient's tolerance, Repositioned    Home Living                          Prior Function            PT Goals (current goals can now be found in the care plan section) Acute Rehab PT Goals Patient Stated Goal: to return to [facility] and recieve rehab there PT Goal Formulation: With patient/family Time For Goal Achievement: 03/18/24 Progress towards PT goals: Progressing toward goals    Frequency    Min 3X/week      PT Plan      Co-evaluation               AM-PAC PT 6 Clicks Mobility   Outcome Measure  Help needed turning from your back to your side while in a flat bed without using bedrails?: A Little Help needed moving from lying on your back to sitting on the side of a flat bed without using bedrails?: A Little Help needed moving to and from a bed to a chair (including a wheelchair)?: A Little Help needed standing up from a chair using your arms (e.g., wheelchair or bedside chair)?: A Little Help needed to walk in hospital room?: A Little Help needed climbing 3-5 steps with a railing? : A Lot 6 Click Score: 17    End of Session   Activity Tolerance: Patient tolerated treatment well Patient left: with call bell/phone within reach;in chair Nurse Communication: Mobility status PT Visit Diagnosis: Unsteadiness on feet (R26.81);Other abnormalities of gait and mobility (R26.89);Muscle weakness (generalized) (M62.81);Pain Pain - Right/Left:  (abdomen) Pain - part of body:  (abdomen)     Time: 8791-8761 PT Time Calculation (min) (ACUTE ONLY): 30 min  Charges:    $Gait Training: 23-37 mins PT General Charges $$ ACUTE PT VISIT: 1 Visit                     Ayuub Penley R. PTA Acute Rehabilitation Services Office: [phone]   Jillian Norton 03/06/2024, 1:32 PM

## 2024-03-06 NOTE — Progress Notes (Signed)
 Pt c/o Left lateral side pain which feels like a band around her chest. She states it is constant without regard to inspiration or expiration. Auscultation of lungs is crackly with a nonproductive cough.  Vertell Pringle, PA for [facility] notified, will try heating pad, robaxin  and oxy given.

## 2024-03-06 NOTE — Progress Notes (Signed)
 Instructed pt on IS and she is performing with good technique.

## 2024-03-06 NOTE — Plan of Care (Signed)
 Pt up ambulating in the hallway today with assistance, foley intact and draining clear yellow urine, no blood noted. Instructed on IS use x 10 while awake and pt demonstrated good technique.

## 2024-03-06 NOTE — Progress Notes (Signed)
 [facility] Progress Note  3 Days Post-Op  Subjective: After foley placed yesterday she felt much better and deferred NGT placement. Patient states she is passing flatus. Appropriately tender to palpation at surgical sites. Cr downtrending to 2.02 today. Foley balloon popped and fell out early this AM  Objective: Vital signs in last 24 hours: Temp:  [97.4 F (36.3 C)-98.2 F (36.8 C)] 97.4 F (36.3 C) (07/11 0431) Pulse Rate:  [85-90] 87 (07/11 0431) Resp:  [17-18] 17 (07/11 0431) BP: (120-165)/(69-72) 165/72 (07/11 0431) SpO2:  [94 %-97 %] 94 % (07/11 0431) Last BM Date :  (PTA)  Intake/Output from previous day: 07/10 0701 - 07/11 0700 In: 580.1 [P.O.:480; IV Piggyback:100.1] Out: 1400 [Urine:1400] Intake/Output this shift: No intake/output data recorded.  PE: Gen:  Alert, NAD, pleasant Card:  Regular rate and rhythm, no lower extremity edema  Pulm:  Normal effort on room air Abd: Soft, appropriately tender to palpation. GU: there is edema to bilateral labias. Mildly improved from yesterday, erythema mildly improved. Skin: warm and dry, no rashes  Psych: A&Ox3   Lab Results:  Recent Labs    03/05/24 0647 03/06/24 0654  WBC 8.1 7.4  HGB 12.0 12.0  HCT 36.2 35.2*  PLT 227 244   BMET Recent Labs    03/05/24 1511 03/06/24 0654  NA 135 136  K 4.5 4.6  CL 107 106  CO2 17* 19*  GLUCOSE 96 98  BUN 27* 22  CREATININE 3.03* 2.02*  CALCIUM 9.1 9.2   PT/INR No results for input(s): LABPROT, INR in the last 72 hours. CMP     Component Value Date/Time   NA 136 03/06/2024 0654   NA 139 11/11/2015 0000   K 4.6 03/06/2024 0654   CL 106 03/06/2024 0654   CO2 19 (L) 03/06/2024 0654   GLUCOSE 98 03/06/2024 0654   BUN 22 03/06/2024 0654   BUN 20 11/11/2015 0000   CREATININE 2.02 (H) 03/06/2024 0654   CREATININE 0.88 05/27/2017 1417   CALCIUM 9.2 03/06/2024 0654   PROT 7.4 03/02/2024 2228   ALBUMIN 3.0 (L) 03/05/2024 1511   AST 14 (L)  03/02/2024 2228   ALT 12 03/02/2024 2228   ALKPHOS 59 03/02/2024 2228   BILITOT 0.4 03/02/2024 2228   GFRNONAA 24 (L) 03/06/2024 0654   GFRNONAA 64 05/27/2017 1417   GFRAA 74 05/27/2017 1417   Lipase     Component Value Date/Time   LIPASE 21 03/02/2024 2228       Studies/Results: CT ABDOMEN PELVIS WO CONTRAST Result Date: 03/05/2024 CLINICAL DATA:  Postop day 2 status post hernia repair and bowel resection with abdominal pain, urinary retention, and abdominal and labial swelling EXAM: CT ABDOMEN AND PELVIS WITHOUT CONTRAST TECHNIQUE: Multidetector CT imaging of the abdomen and pelvis was performed following the standard protocol without IV contrast. RADIATION DOSE REDUCTION: This exam was performed according to the departmental dose-optimization program which includes automated exposure control, adjustment of the mA and/or kV according to patient size and/or use of iterative reconstruction technique. COMPARISON:  CT abdomen and pelvis dated 03/03/2024 FINDINGS: Lower chest: New left lower lobe consolidation and tree-in-bud nodules. Trace right pleural effusion. Partially imaged heart size is normal. Coronary artery calcifications. Hepatobiliary: No focal hepatic lesions. No intra or extrahepatic biliary ductal dilation. Vicariously excreted contrast material within the gallbladder. Pancreas: No focal lesions or main ductal dilation. Spleen: Normal in size without focal abnormality. Adrenals/Urinary Tract: No adrenal nodules. No suspicious renal mass on this noncontrast enhanced examination  or hydronephrosis. Punctate nonobstructing left lower pole stone. Decompressed urinary bladder with catheter in-situ. Stomach/Bowel: Normal appearance of the stomach. Midline small bowel anastomosis deep to the level of the umbilicus. No evidence of bowel wall thickening, distention, or inflammatory changes. Normal appendix. Vascular/Lymphatic: Aortic atherosclerosis. No enlarged abdominal or pelvic lymph nodes.  Reproductive: No adnexal masses. 2.1 x 1.8 cm hyperattenuating focus within the left perineal region (3:83), likely subtly present on prior examination and may represent a Bartholin cyst. Other: Small volume intraperitoneal free air. Scattered foci of subcutaneous emphysema in the left hemiabdomen, most notable in the left inguinal region, where there is small loculated fluid measuring 4.0 x 1.8 cm (3:70). Moderate volume presacral and pelvic free fluid. Musculoskeletal: No acute or abnormal lytic or blastic osseous lesions. Multilevel degenerative changes of the partially imaged thoracic and lumbar spine. Extensive body wall edema along bilateral flank and anterior lower abdomen extending into the labia and proximal thighs. IMPRESSION: 1. Small volume intraperitoneal free air, likely postsurgical. 2. Scattered foci of subcutaneous emphysema in the left hemiabdomen, most notable in the left inguinal region, likely postsurgical. Small loculated fluid in the left inguinal subcutaneous soft tissues, which may represent a postoperative seroma or hematoma. 3. Moderate volume presacral and pelvic free fluid, likely postsurgical. 4. Extensive body wall edema along bilateral flank and anterior lower abdomen extending into the labia and proximal thighs. 5. New left lower lobe consolidation and tree-in-bud nodules, likely aspiration or pneumonia. 6. Trace right pleural effusion. 7. Aortic Atherosclerosis (ICD10-I70.0). Coronary artery calcifications. Assessment for potential risk factor modification, dietary therapy or pharmacologic therapy may be warranted, if clinically indicated. Electronically Signed   By: Limin  Xu M.D.   On: 03/05/2024 14:23   DG Abd Portable 1V Result Date: 03/05/2024 CLINICAL DATA:  Postoperative nausea vomiting. EXAM: PORTABLE ABDOMEN - 1 VIEW COMPARISON:  03/04/2024. FINDINGS: Nonobstructive bowel gas pattern. Mild gaseous distension of small bowel loops in the left abdomen. No evidence of free  air. Visualized osseous structures are unchanged. IMPRESSION: Nonobstructive bowel gas pattern. Mild gaseous distension of small bowel loops in the left abdomen. Electronically Signed   By: Harrietta Sherry M.D.   On: 03/05/2024 11:24   DG Abd Portable 1V Result Date: 03/04/2024 CLINICAL DATA:  [id number] Vomiting [id number].  Abdomen pain. EXAM: PORTABLE ABDOMEN - 1 VIEW COMPARISON:  None Available. FINDINGS: The bowel gas pattern is non-obstructive.  No abnormal stool burden. No evidence of pneumoperitoneum, within the limitations of a supine film. No acute osseous abnormalities. The soft tissues are within normal limits. Surgical changes, devices, tubes and lines: None. IMPRESSION: Nonobstructive bowel gas pattern. Electronically Signed   By: Ree Molt M.D.   On: 03/04/2024 12:00    Anti-infectives: Anti-infectives (From admission, onward)    Start     Dose/Rate Route Frequency Ordered Stop   03/06/24 1000  cefTRIAXone  (ROCEPHIN ) 2 g in sodium chloride  0.9 % 100 mL IVPB        2 g 200 mL/hr over 30 Minutes Intravenous Every 24 hours 03/06/24 0903 03/08/24 0959   03/05/24 1400  piperacillin -tazobactam (ZOSYN ) IVPB 2.25 g        2.25 g 100 mL/hr over 30 Minutes Intravenous Every 8 hours 03/05/24 1247 03/06/24 0535   03/05/24 1130  piperacillin -tazobactam (ZOSYN ) IVPB 3.375 g  Status:  Discontinued        3.375 g 12.5 mL/hr over 240 Minutes Intravenous Every 8 hours 03/05/24 1112 03/05/24 1247   03/03/24 1400  piperacillin -tazobactam (ZOSYN ) IVPB 3.375 g  3.375 g 12.5 mL/hr over 240 Minutes Intravenous Every 8 hours 03/03/24 1259 03/05/24 0751   03/03/24 0645  ceFAZolin  (ANCEF ) IVPB 2g/100 mL premix        2 g 200 mL/hr over 30 Minutes Intravenous On call to O.R. 03/03/24 0640 03/03/24 0740        Assessment/Plan  Incarcerated left femoral hernia  POD#3 s/p diagnostic laparoscopy, left femoral hernia repair with mesh, SBR with primary anastomosis 7/8 Dr. Ann - Given issues with  urinary retention yesterday and still some edema to labias that may be contributing to retention, will replace foley this AM as she doesn't feel like she can void without foley yet. - Will advance to FLD since having bowel function - UA with small bacteria, given retention symptoms will treat for total 3 days abx. Narrowed to ceftriaxone    FEN: FLD ID: Zosyn  resumed 7/10 due to clinical concern for abscess, narrowed 7/11 to ceftriaxone  for UTI VTE: SCD's, Lovenox  Foley: replace today for retention Dispo: med-surg, if tolerates diet advancement possible discharge home in next 24-48h    LOS: 3 days   I reviewed nursing notes, last 24 h vitals and pain scores, last 48 h intake and output, last 24 h labs and trends, and last 24 h imaging results.  This care required moderate level of medical decision making.

## 2024-03-06 NOTE — TOC Progression Note (Addendum)
 Transition of Care [facility]) - Progression Note    Patient Details  Name: Jillian Norton MRN: [id number] Date of Birth: 1941-06-06  [facility] CM/SW Contact  Luise JAYSON Pan, [place] Phone Number: 03/06/2024, 8:52 AM  Clinical Narrative:   Per PA-C yesterday via secure chat (7/10), patient is not medically ready, will be a few more days.   03/06/24 CSW spoke with [facility] and informed that family is unsure if she'll return to ILF or need STR. [facility] stated they can accept her for SNF if needed.   8:57 AM CSW informed Camie of the above information. At this time family is undecided but thinks patient could potentially benefit from STR. CSW will need to f/u at a later time regarding discharge plan.   TOC will continue to follow.    Expected Discharge Plan:  (ILF vs SNF) Barriers to Discharge: Continued Medical Work up  Expected Discharge Plan and Services In-house Referral: Clinical Social Work Discharge Planning Services: CM Consult   Living arrangements for the past 2 months: Independent Living Facility                                       Social Determinants of Health (SDOH) Interventions SDOH Screenings   Food Insecurity: No Food Insecurity (03/03/2024)  Housing: Unknown (03/03/2024)  Transportation Needs: No Transportation Needs (03/03/2024)  Utilities: Not At Risk (03/03/2024)  Social Connections: Moderately Isolated (03/03/2024)  Tobacco Use: Medium Risk (03/03/2024)    Readmission Risk Interventions     No data to display

## 2024-03-06 NOTE — Progress Notes (Signed)
 Found foley catheter to be out of bladder with deflated and torn balloon.  Bladder scan done with 20 ml in bladder.  Pubic area less red this am than yesterday.  Pt states she is passing gas and there is no nausea/vomiting.  Obtained gas chair for pt to get up and sit in and will ask Dr if can leave foley out for now and see if she can void on her own.

## 2024-03-07 LAB — BASIC METABOLIC PANEL WITH GFR
Anion gap: 12 (ref 5–15)
BUN: 10 mg/dL (ref 8–23)
CO2: 19 mmol/L — ABNORMAL LOW (ref 22–32)
Calcium: 9 mg/dL (ref 8.9–10.3)
Chloride: 105 mmol/L (ref 98–111)
Creatinine, Ser: 0.73 mg/dL (ref 0.44–1.00)
GFR, Estimated: >60 mL/min (ref >60)
Glucose, Bld: 88 mg/dL (ref 70–99)
Potassium: 4.2 mmol/L (ref 3.5–5.1)
Sodium: 136 mmol/L (ref 135–145)

## 2024-03-07 LAB — CBC
HCT: 35.7 % — ABNORMAL LOW (ref 36.0–46.0)
Hemoglobin: 11.7 g/dL — ABNORMAL LOW (ref 12.0–15.0)
MCH: 30.3 pg (ref 26.0–34.0)
MCHC: 32.8 g/dL (ref 30.0–36.0)
MCV: 92.5 fL (ref 80.0–100.0)
Platelets: 286 K/uL (ref 150–400)
RBC: 3.86 MIL/uL — ABNORMAL LOW (ref 3.87–5.11)
RDW: 13.3 % (ref 11.5–15.5)
WBC: 7.1 K/uL (ref 4.0–10.5)
nRBC: 0 % (ref 0.0–0.2)

## 2024-03-07 LAB — PHOSPHORUS: Phosphorus: 2.3 mg/dL — ABNORMAL LOW (ref 2.5–4.6)

## 2024-03-07 LAB — MAGNESIUM: Magnesium: 2 mg/dL (ref 1.7–2.4)

## 2024-03-07 LAB — URINE CULTURE: Culture: NO GROWTH

## 2024-03-07 LAB — GLUCOSE, CAPILLARY: Glucose-Capillary: 145 mg/dL — ABNORMAL HIGH (ref 70–99)

## 2024-03-07 LAB — CREATININE, URINE, 24 HOUR
Collection Interval-UCRE24: 24 h
Creatinine, 24H Ur: 1170 mg/d (ref 600–1800)
Creatinine, Urine: 39 mg/dL
Urine Total Volume-UCRE24: 3000 mL

## 2024-03-07 MED ORDER — ALBUTEROL SULFATE (2.5 MG/3ML) 0.083% IN NEBU
2.5000 mg | INHALATION_SOLUTION | Freq: Two times a day (BID) | RESPIRATORY_TRACT | Status: DC | PRN
  Administered 2024-03-07: 2.5 mg via RESPIRATORY_TRACT
  Filled 2024-03-07: qty 3

## 2024-03-07 NOTE — [place] FL2 (Signed)
 [place]  MEDICAID FL2 LEVEL OF CARE FORM     IDENTIFICATION  Patient Name: Jillian Norton Birthdate: [birth year] Sex: female Admission Date (Current Location): 03/03/2024  [facility] and [place] Number:  Producer, television/film/video and Address:  The . [facility], 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 03/03/2024   Arthritis of carpometacarpal [facility]) joint of left thumb 03/05/2018   De Quervain's tenosynovitis, right 03/05/2018   Malignant neoplasm of upper-inner quadrant of right breast in female, estrogen receptor positive (HCC) 10/24/2017   Overweight (BMI 25.0-29.9) 04/07/2017   History of cancer of right breast 04/07/2017   History of DVT (deep vein thrombosis) 04/07/2017   GERD (gastroesophageal reflux disease) 11/01/2016   Primary insomnia 11/01/2016   Urge incontinence 11/01/2016   History of colon polyps 11/01/2016   Postnasal drip 11/01/2016   Shingles 06/27/2016    Orientation RESPIRATION BLADDER Height & Weight     Self, Situation, Time, Place  Normal Continent, Indwelling catheter Weight: 149 lb 4 oz (67.7 kg) Height:  5' 4 (162.6 cm)  BEHAVIORAL SYMPTOMS/MOOD NEUROLOGICAL BOWEL NUTRITION STATUS      Continent Diet (see dc summary)  AMBULATORY STATUS COMMUNICATION OF NEEDS Skin   Supervision Verbally Other (Comment) (Wound Surgical Closed Surgical Incision Groin Left)                       Personal Care Assistance Level of Assistance  Bathing, Feeding, Dressing Bathing Assistance: Limited assistance Feeding  assistance: Independent Dressing Assistance: Independent     Functional Limitations Info  Sight, Speech, Hearing Sight Info: Adequate Hearing Info: Impaired (hard of hearing) Speech Info: Adequate    SPECIAL CARE FACTORS FREQUENCY  PT (By licensed PT), OT (By licensed OT)     PT Frequency: 5x week OT Frequency: 5x week            Contractures Contractures Info: Not present    Additional Factors Info  Code Status, Allergies, Psychotropic Code Status Info: Full Allergies Info: Sporanos (Itraconazole), Tamoxifen, Anastrozole , Sulfa Antibiotics Psychotropic Info: WELLBUTRIN  XL         Current Medications (03/07/2024):  This is the current hospital active medication list Current Facility-Administered Medications  Medication Dose Route Frequency Provider Last Rate Last Admin   acetaminophen  (TYLENOL ) tablet 1,000 mg  1,000 mg Oral Q6H Ann Fine, MD   1,000 mg at 03/07/24 0615   buPROPion  (WELLBUTRIN  XL) 24 hr tablet 300 mg  300 mg Oral q AM Simaan, Elizabeth S, PA-C   300 mg at 03/07/24 0615   cefTRIAXone  (ROCEPHIN ) 2 g in sodium chloride  0.9 % 100 mL IVPB  2 g Intravenous Q24H Ann Fine, MD   Stopped at 03/06/24 1507   Chlorhexidine  Gluconate Cloth 2 % PADS 6 each  6 each Topical Daily Simaan, Elizabeth S, PA-C   6 each at 03/07/24 0944   enoxaparin  (LOVENOX ) injection 30 mg  30 mg Subcutaneous Q24H Reome, Earle J, RPH   30 mg at 03/06/24 1457   feeding  supplement (ENSURE PLUS HIGH PROTEIN) liquid 237 mL  237 mL Oral BID BM Simaan, Elizabeth S, PA-C   237 mL at 03/07/24 0944   guaiFENesin  (MUCINEX ) 12 hr tablet 600 mg  600 mg Oral BID Augustus Almarie RAMAN, PA-C   600 mg at 03/07/24 9056   hydrALAZINE  (APRESOLINE ) injection 10 mg  10 mg Intravenous Q2H PRN Ann Fine, MD       HYDROmorphone  (DILAUDID ) injection 1 mg  1 mg Intravenous Q4H PRN Ann Fine, MD   1 mg at 03/06/24 1732   lidocaine  (LIDODERM ) 5 % 2 patch  2 patch Transdermal Daily Simaan,  Elizabeth S, PA-C   2 patch at 03/06/24 1028   methocarbamol  (ROBAXIN ) tablet 500 mg  500 mg Oral TID Simaan, Elizabeth S, PA-C   500 mg at 03/06/24 2113   ondansetron  (ZOFRAN -ODT) disintegrating tablet 4 mg  4 mg Oral Q6H PRN Ann Fine, MD       Or   ondansetron  (ZOFRAN ) injection 4 mg  4 mg Intravenous Q6H PRN Ann Fine, MD   4 mg at 03/05/24 9571   Oral care mouth rinse  15 mL Mouth Rinse PRN Ann Fine, MD       oxyCODONE  (Oxy IR/ROXICODONE ) immediate release tablet 5 mg  5 mg Oral Q4H PRN Ann Fine, MD   5 mg at 03/06/24 1601   pantoprazole  (PROTONIX ) injection 40 mg  40 mg Intravenous Q24H Zhou, Jenny Z, RPH       Or   pantoprazole  (PROTONIX ) EC tablet 40 mg  40 mg Oral Q24H Zhou, Jenny Z, RPH   40 mg at 03/07/24 0943   polyethylene glycol (MIRALAX  / GLYCOLAX ) packet 17 g  17 g Oral Daily PRN Ann Fine, MD       zolpidem  (AMBIEN ) tablet 5-10 mg  5-10 mg Oral QHS PRN Simaan, Elizabeth S, PA-C   10 mg at 03/06/24 2113     Discharge Medications: Please see discharge summary for a list of discharge medications.  Relevant Imaging Results:  Relevant Lab Results:   Additional Information SSN [id number]  Inocente RAMAN Kindle, LCSW

## 2024-03-07 NOTE — Plan of Care (Signed)
   Problem: Education: Goal: Knowledge of General Education information will improve Description Including pain rating scale, medication(s)/side effects and non-pharmacologic comfort measures Outcome: Progressing

## 2024-03-07 NOTE — Progress Notes (Signed)
 [facility] Progress Note  4 Days Post-Op  Subjective: Foley functioning well. Patient states she is passing flatus and having diarrhea. Appropriately tender to palpation at surgical sites.   Objective: Vital signs in last 24 hours: Temp:  [97.7 F (36.5 C)-98.5 F (36.9 C)] 97.7 F (36.5 C) (07/12 0852) Pulse Rate:  [84-87] 84 (07/12 0852) Resp:  [16-18] 18 (07/12 0852) BP: (130-150)/(68-92) 150/73 (07/12 0852) SpO2:  [98 %-99 %] 98 % (07/12 0852) Weight:  [67.7 kg] 67.7 kg (07/12 0325) Last BM Date : 03/04/24  Intake/Output from previous day: 07/11 0701 - 07/12 0700 In: 762.8 [P.O.:660; IV Piggyback:102.8] Out: 2800 [Urine:2800] Intake/Output this shift: No intake/output data recorded.  PE: Gen:  Alert, NAD, pleasant Card:  Regular rate and rhythm, no lower extremity edema  Pulm:  Normal effort on room air Abd: Soft, appropriately tender to palpation. GU: there is edema to bilateral labias. Mildly improved from yesterday, erythema mildly improved. Skin: warm and dry, no rashes  Psych: A&Ox3   Lab Results:  Recent Labs    03/06/24 0654 03/07/24 0717  WBC 7.4 7.1  HGB 12.0 11.7*  HCT 35.2* 35.7*  PLT 244 286   BMET Recent Labs    03/06/24 0654 03/07/24 0717  NA 136 136  K 4.6 4.2  CL 106 105  CO2 19* 19*  GLUCOSE 98 88  BUN 22 10  CREATININE 2.02* 0.73  CALCIUM 9.2 9.0   PT/INR No results for input(s): LABPROT, INR in the last 72 hours. CMP     Component Value Date/Time   NA 136 03/07/2024 0717   NA 139 11/11/2015 0000   K 4.2 03/07/2024 0717   CL 105 03/07/2024 0717   CO2 19 (L) 03/07/2024 0717   GLUCOSE 88 03/07/2024 0717   BUN 10 03/07/2024 0717   BUN 20 11/11/2015 0000   CREATININE 0.73 03/07/2024 0717   CREATININE 0.88 05/27/2017 1417   CALCIUM 9.0 03/07/2024 0717   PROT 7.4 03/02/2024 2228   ALBUMIN 3.0 (L) 03/05/2024 1511   AST 14 (L) 03/02/2024 2228   ALT 12 03/02/2024 2228   ALKPHOS 59 03/02/2024 2228   BILITOT  0.4 03/02/2024 2228   GFRNONAA >60 03/07/2024 0717   GFRNONAA 64 05/27/2017 1417   GFRAA 74 05/27/2017 1417   Lipase     Component Value Date/Time   LIPASE 21 03/02/2024 2228       Studies/Results: CT ABDOMEN PELVIS WO CONTRAST Result Date: 03/05/2024 CLINICAL DATA:  Postop day 2 status post hernia repair and bowel resection with abdominal pain, urinary retention, and abdominal and labial swelling EXAM: CT ABDOMEN AND PELVIS WITHOUT CONTRAST TECHNIQUE: Multidetector CT imaging of the abdomen and pelvis was performed following the standard protocol without IV contrast. RADIATION DOSE REDUCTION: This exam was performed according to the departmental dose-optimization program which includes automated exposure control, adjustment of the mA and/or kV according to patient size and/or use of iterative reconstruction technique. COMPARISON:  CT abdomen and pelvis dated 03/03/2024 FINDINGS: Lower chest: New left lower lobe consolidation and tree-in-bud nodules. Trace right pleural effusion. Partially imaged heart size is normal. Coronary artery calcifications. Hepatobiliary: No focal hepatic lesions. No intra or extrahepatic biliary ductal dilation. Vicariously excreted contrast material within the gallbladder. Pancreas: No focal lesions or main ductal dilation. Spleen: Normal in size without focal abnormality. Adrenals/Urinary Tract: No adrenal nodules. No suspicious renal mass on this noncontrast enhanced examination or hydronephrosis. Punctate nonobstructing left lower pole stone. Decompressed urinary bladder with catheter in-situ.  Stomach/Bowel: Normal appearance of the stomach. Midline small bowel anastomosis deep to the level of the umbilicus. No evidence of bowel wall thickening, distention, or inflammatory changes. Normal appendix. Vascular/Lymphatic: Aortic atherosclerosis. No enlarged abdominal or pelvic lymph nodes. Reproductive: No adnexal masses. 2.1 x 1.8 cm hyperattenuating focus within the left  perineal region (3:83), likely subtly present on prior examination and may represent a Bartholin cyst. Other: Small volume intraperitoneal free air. Scattered foci of subcutaneous emphysema in the left hemiabdomen, most notable in the left inguinal region, where there is small loculated fluid measuring 4.0 x 1.8 cm (3:70). Moderate volume presacral and pelvic free fluid. Musculoskeletal: No acute or abnormal lytic or blastic osseous lesions. Multilevel degenerative changes of the partially imaged thoracic and lumbar spine. Extensive body wall edema along bilateral flank and anterior lower abdomen extending into the labia and proximal thighs. IMPRESSION: 1. Small volume intraperitoneal free air, likely postsurgical. 2. Scattered foci of subcutaneous emphysema in the left hemiabdomen, most notable in the left inguinal region, likely postsurgical. Small loculated fluid in the left inguinal subcutaneous soft tissues, which may represent a postoperative seroma or hematoma. 3. Moderate volume presacral and pelvic free fluid, likely postsurgical. 4. Extensive body wall edema along bilateral flank and anterior lower abdomen extending into the labia and proximal thighs. 5. New left lower lobe consolidation and tree-in-bud nodules, likely aspiration or pneumonia. 6. Trace right pleural effusion. 7. Aortic Atherosclerosis (ICD10-I70.0). Coronary artery calcifications. Assessment for potential risk factor modification, dietary therapy or pharmacologic therapy may be warranted, if clinically indicated. Electronically Signed   By: Limin  Xu M.D.   On: 03/05/2024 14:23   DG Abd Portable 1V Result Date: 03/05/2024 CLINICAL DATA:  Postoperative nausea vomiting. EXAM: PORTABLE ABDOMEN - 1 VIEW COMPARISON:  03/04/2024. FINDINGS: Nonobstructive bowel gas pattern. Mild gaseous distension of small bowel loops in the left abdomen. No evidence of free air. Visualized osseous structures are unchanged. IMPRESSION: Nonobstructive bowel gas  pattern. Mild gaseous distension of small bowel loops in the left abdomen. Electronically Signed   By: Harrietta Sherry M.D.   On: 03/05/2024 11:24    Anti-infectives: Anti-infectives (From admission, onward)    Start     Dose/Rate Route Frequency Ordered Stop   03/06/24 1400  cefTRIAXone  (ROCEPHIN ) 2 g in sodium chloride  0.9 % 100 mL IVPB        2 g 200 mL/hr over 30 Minutes Intravenous Every 24 hours 03/06/24 0903 03/08/24 1359   03/05/24 1400  piperacillin -tazobactam (ZOSYN ) IVPB 2.25 g        2.25 g 100 mL/hr over 30 Minutes Intravenous Every 8 hours 03/05/24 1247 03/06/24 0535   03/05/24 1130  piperacillin -tazobactam (ZOSYN ) IVPB 3.375 g  Status:  Discontinued        3.375 g 12.5 mL/hr over 240 Minutes Intravenous Every 8 hours 03/05/24 1112 03/05/24 1247   03/03/24 1400  piperacillin -tazobactam (ZOSYN ) IVPB 3.375 g        3.375 g 12.5 mL/hr over 240 Minutes Intravenous Every 8 hours 03/03/24 1259 03/05/24 0751   03/03/24 0645  ceFAZolin  (ANCEF ) IVPB 2g/100 mL premix        2 g 200 mL/hr over 30 Minutes Intravenous On call to O.R. 03/03/24 0640 03/03/24 0740        Assessment/Plan  Incarcerated left femoral hernia  POD#4 s/p diagnostic laparoscopy, left femoral hernia repair with mesh, SBR with primary anastomosis 7/8 Dr. Ann - Given issues with urinary retention yesterday will cont foley this AM  - Will  advance to RD since having bowel function - UA with small bacteria, culture pending FEN: RD ID: Zosyn  resumed 7/10 due to clinical concern for abscess, narrowed 7/11 to ceftriaxone  for UTI VTE: SCD's, Lovenox  Foley: Cont today for retention Dispo: med-surg, if tolerates diet advancement possible discharge home in next 2-3 days    LOS: 4 days   I reviewed nursing notes, last 24 h vitals and pain scores, last 48 h intake and output, last 24 h labs and trends, and last 24 h imaging results.  Bernarda JAYSON Ned, MD  Colorectal and General Surgery [facility]  Surgery

## 2024-03-07 NOTE — Plan of Care (Signed)

## 2024-03-07 NOTE — TOC Progression Note (Signed)
 Transition of Care [facility]) - Progression Note    Patient Details  Name: Jillian Norton MRN: [id number] Date of Birth: 08-Aug-1941  Transition of Care [facility] CM/SW Contact  Isaiah Public, LCSWA Phone Number: 03/07/2024, 1:47 PM  Clinical Narrative:     CSW spoke with patient regarding SNF at [facility] vrs going back to independent living side. Patient currently in agreement with going to short term rehab, but may change mind closer to being medically ready for dc. Patient would like for CSW to follow up with her closer to being ready to check in to see if plan is still for short term rehab. [facility] can accept patient for short term rehab. CSW will continue to follow and follow up with patient and facility closer to being medically ready for dc.   Expected Discharge Plan:  (ILF vs SNF) Barriers to Discharge: Continued Medical Work up  Expected Discharge Plan and Services In-house Referral: Clinical Social Work Discharge Planning Services: CM Consult   Living arrangements for the past 2 months: Independent Living Facility                                       Social Determinants of Health (SDOH) Interventions SDOH Screenings   Food Insecurity: No Food Insecurity (03/03/2024)  Housing: Unknown (03/03/2024)  Transportation Needs: No Transportation Needs (03/03/2024)  Utilities: Not At Risk (03/03/2024)  Social Connections: Moderately Isolated (03/03/2024)  Tobacco Use: Medium Risk (03/03/2024)    Readmission Risk Interventions     No data to display

## 2024-03-08 LAB — PHOSPHORUS: Phosphorus: 2.4 mg/dL — ABNORMAL LOW (ref 2.5–4.6)

## 2024-03-08 LAB — BASIC METABOLIC PANEL WITH GFR
Anion gap: 10 (ref 5–15)
BUN: 9 mg/dL (ref 8–23)
CO2: 26 mmol/L (ref 22–32)
Calcium: 9.4 mg/dL (ref 8.9–10.3)
Chloride: 101 mmol/L (ref 98–111)
Creatinine, Ser: 0.69 mg/dL (ref 0.44–1.00)
GFR, Estimated: >60 mL/min (ref >60)
Glucose, Bld: 137 mg/dL — ABNORMAL HIGH (ref 70–99)
Potassium: 3.9 mmol/L (ref 3.5–5.1)
Sodium: 137 mmol/L (ref 135–145)

## 2024-03-08 LAB — CBC
HCT: 35.7 % — ABNORMAL LOW (ref 36.0–46.0)
Hemoglobin: 11.9 g/dL — ABNORMAL LOW (ref 12.0–15.0)
MCH: 30 pg (ref 26.0–34.0)
MCHC: 33.3 g/dL (ref 30.0–36.0)
MCV: 89.9 fL (ref 80.0–100.0)
Platelets: 359 K/uL (ref 150–400)
RBC: 3.97 MIL/uL (ref 3.87–5.11)
RDW: 13.1 % (ref 11.5–15.5)
WBC: 6.4 K/uL (ref 4.0–10.5)
nRBC: 0 % (ref 0.0–0.2)

## 2024-03-08 LAB — MAGNESIUM: Magnesium: 2 mg/dL (ref 1.7–2.4)

## 2024-03-08 MED ORDER — KETOROLAC TROMETHAMINE 15 MG/ML IJ SOLN
15.0000 mg | Freq: Once | INTRAMUSCULAR | Status: AC
  Administered 2024-03-08: 15 mg via INTRAVENOUS
  Filled 2024-03-08: qty 1

## 2024-03-08 MED ORDER — OXYBUTYNIN CHLORIDE 5 MG PO TABS
5.0000 mg | ORAL_TABLET | Freq: Three times a day (TID) | ORAL | Status: DC
  Administered 2024-03-08 (×2): 5 mg via ORAL
  Filled 2024-03-08 (×3): qty 1

## 2024-03-08 MED ORDER — ENOXAPARIN SODIUM 40 MG/0.4ML IJ SOSY
40.0000 mg | PREFILLED_SYRINGE | INTRAMUSCULAR | Status: DC
  Administered 2024-03-09: 40 mg via SUBCUTANEOUS
  Filled 2024-03-08: qty 0.4

## 2024-03-08 NOTE — Progress Notes (Addendum)
 Pt still in pain 10/10. Tender to touch on lower right quadrant.  Pt quivering in lower lip.  Paged ON call surgeon again. Giving a one time dose of Toradol  IV stat.  Plans to start ditropan  TID per orders.

## 2024-03-08 NOTE — Progress Notes (Signed)
 Pt grimacing and moaning in bed.  Pt states she has urinated several times.  Pt describes pain as a dagger going through , intermittent stabbing pain.  Post void residual 25 ml.  Paged Surgery on call.  New orders executed. Will continue to monitor.

## 2024-03-08 NOTE — Plan of Care (Signed)
  Problem: Education: Goal: Knowledge of General Education information will improve Description: Including pain rating scale, medication(s)/side effects and non-pharmacologic comfort measures Outcome: Progressing   Problem: Health Behavior/Discharge Planning: Goal: Ability to manage health-related needs will improve Outcome: Progressing   Problem: Clinical Measurements: Goal: Respiratory complications will improve Outcome: Progressing Goal: Cardiovascular complication will be avoided Outcome: Progressing   Problem: Activity: Goal: Risk for activity intolerance will decrease Outcome: Progressing   Problem: Coping: Goal: Level of anxiety will decrease Outcome: Progressing   Problem: Nutrition: Goal: Adequate nutrition will be maintained Outcome: Progressing   Problem: Elimination: Goal: Will not experience complications related to bowel motility Outcome: Progressing

## 2024-03-08 NOTE — Progress Notes (Signed)
 [facility] Progress Note  5 Days Post-Op  Subjective: Foley functioning well. Patient states she is passing flatus and having loose stools.   Objective: Vital signs in last 24 hours: Temp:  [97.5 F (36.4 C)-98.3 F (36.8 C)] 98.3 F (36.8 C) (07/13 0514) Pulse Rate:  [80-104] 104 (07/13 0802) Resp:  [17-18] 18 (07/13 0802) BP: (124-159)/(69-79) 124/78 (07/13 0802) SpO2:  [97 %-100 %] 100 % (07/13 0802) Last BM Date : 03/08/24  Intake/Output from previous day: 07/12 0701 - 07/13 0700 In: 360 [P.O.:360] Out: -  Intake/Output this shift: No intake/output data recorded.  PE: Gen:  Alert, NAD, pleasant Card:  Regular rate and rhythm, no lower extremity edema  Pulm:  Normal effort on room air Abd: Soft, appropriately tender to palpation. Skin: warm and dry, no rashes  Psych: A&Ox3   Lab Results:  Recent Labs    03/07/24 0717 03/08/24 0859  WBC 7.1 6.4  HGB 11.7* 11.9*  HCT 35.7* 35.7*  PLT 286 359   BMET Recent Labs    03/06/24 0654 03/07/24 0717  NA 136 136  K 4.6 4.2  CL 106 105  CO2 19* 19*  GLUCOSE 98 88  BUN 22 10  CREATININE 2.02* 0.73  CALCIUM 9.2 9.0   PT/INR No results for input(s): LABPROT, INR in the last 72 hours. CMP     Component Value Date/Time   NA 136 03/07/2024 0717   NA 139 11/11/2015 0000   K 4.2 03/07/2024 0717   CL 105 03/07/2024 0717   CO2 19 (L) 03/07/2024 0717   GLUCOSE 88 03/07/2024 0717   BUN 10 03/07/2024 0717   BUN 20 11/11/2015 0000   CREATININE 0.73 03/07/2024 0717   CREATININE 0.88 05/27/2017 1417   CALCIUM 9.0 03/07/2024 0717   PROT 7.4 03/02/2024 2228   ALBUMIN 3.0 (L) 03/05/2024 1511   AST 14 (L) 03/02/2024 2228   ALT 12 03/02/2024 2228   ALKPHOS 59 03/02/2024 2228   BILITOT 0.4 03/02/2024 2228   GFRNONAA >60 03/07/2024 0717   GFRNONAA 64 05/27/2017 1417   GFRAA 74 05/27/2017 1417   Lipase     Component Value Date/Time   LIPASE 21 03/02/2024 2228       Studies/Results: No  results found.   Anti-infectives: Anti-infectives (From admission, onward)    Start     Dose/Rate Route Frequency Ordered Stop   03/06/24 1400  cefTRIAXone  (ROCEPHIN ) 2 g in sodium chloride  0.9 % 100 mL IVPB        2 g 200 mL/hr over 30 Minutes Intravenous Every 24 hours 03/06/24 0903 03/07/24 1436   03/05/24 1400  piperacillin -tazobactam (ZOSYN ) IVPB 2.25 g        2.25 g 100 mL/hr over 30 Minutes Intravenous Every 8 hours 03/05/24 1247 03/06/24 0535   03/05/24 1130  piperacillin -tazobactam (ZOSYN ) IVPB 3.375 g  Status:  Discontinued        3.375 g 12.5 mL/hr over 240 Minutes Intravenous Every 8 hours 03/05/24 1112 03/05/24 1247   03/03/24 1400  piperacillin -tazobactam (ZOSYN ) IVPB 3.375 g        3.375 g 12.5 mL/hr over 240 Minutes Intravenous Every 8 hours 03/03/24 1259 03/05/24 0751   03/03/24 0645  ceFAZolin  (ANCEF ) IVPB 2g/100 mL premix        2 g 200 mL/hr over 30 Minutes Intravenous On call to O.R. 03/03/24 0640 03/03/24 0740        Assessment/Plan  Incarcerated left femoral hernia  POD#5 s/p diagnostic laparoscopy,  left femoral hernia repair with mesh, SBR with primary anastomosis 7/8 Dr. Ann - will d/c foley today and do voiding trial and check PVRs.   - Cont RD  - UA with small bacteria, culture with no growth FEN: RD ID: Zosyn  resumed 7/10 due to clinical concern for abscess, narrowed 7/11 to ceftriaxone  for UTI VTE: SCD's, Lovenox  Foley: d/c and check PVRs Dispo: med-surg, if tolerates diet advancement possible discharge home in next 1-2 days    LOS: 5 days   I reviewed nursing notes, last 24 h vitals and pain scores, last 48 h intake and output, last 24 h labs and trends, and last 24 h imaging results.  Bernarda JAYSON Ned, MD  Colorectal and General Surgery [facility] Surgery

## 2024-03-09 LAB — MAGNESIUM: Magnesium: 2.1 mg/dL (ref 1.7–2.4)

## 2024-03-09 LAB — PHOSPHORUS: Phosphorus: 3.7 mg/dL (ref 2.5–4.6)

## 2024-03-09 MED ORDER — SENNOSIDES-DOCUSATE SODIUM 8.6-50 MG PO TABS
1.0000 | ORAL_TABLET | Freq: Two times a day (BID) | ORAL | Status: DC
  Administered 2024-03-09 – 2024-03-10 (×3): 1 via ORAL
  Filled 2024-03-09 (×3): qty 1

## 2024-03-09 MED ORDER — HYOSCYAMINE SULFATE 0.125 MG PO TBDP: 0.1250 mg | ORAL_TABLET | ORAL | Status: DC | PRN

## 2024-03-09 NOTE — Progress Notes (Signed)
 2200: Pt complaining of pain 10/10 after she tried to urinate to bathroom. Pt stated there is nothing coming out and started complaining of lower abdominal pain. Pt last void around 5 pm. PRN meds were given with relief after few minutes. On call provider made aware. Ordered foley catheter insertion with noted output amber cloudy appearance. Pt is also requesting to have urology consult. Will continue to monitor.

## 2024-03-09 NOTE — Progress Notes (Signed)
 Mobility Specialist Progress Note:   03/09/24 1549  Mobility  Activity Ambulated with assistance in hallway;Ambulated with assistance to bathroom  Level of Assistance Contact guard assist, steadying assist  Assistive Device Front wheel walker  Distance Ambulated (ft) 1100 ft  Activity Response Tolerated well  Mobility Referral Yes  Mobility visit 1 Mobility  Mobility Specialist Start Time (ACUTE ONLY) 1531  Mobility Specialist Stop Time (ACUTE ONLY) 1546  Mobility Specialist Time Calculation (min) (ACUTE ONLY) 15 min   Received pt in bed and agreeable to mobility. No physical assistance required. Pt requested using the BR before ambulating halls. No c/o. Returned pt to room without fault. Left in bed with personal belongings and call light within reach. All needs met.  Lavanda Pollack Mobility Specialist  Please contact via [employer] or  Rehab Office [phone]

## 2024-03-09 NOTE — Plan of Care (Signed)
 Progressing toward discharge. Foley is out and pt will attempt to void on her own. Pt up and ambulating in the hallway with PT. Using IS, cough decreased.

## 2024-03-09 NOTE — Progress Notes (Signed)
 Physical Therapy Treatment Patient Details Name: Jillian Norton MRN: [id number] DOB: 1941/07/07 Today's Date: 03/09/2024   History of Present Illness Pt is a [age] y/o active F s/p diagnostic laparoscopy, left femoral hernia repair with mesh. PMH unremarkable    PT Comments  Pt eager for mobility and demonstrating good progress towards acute goals. Pt demonstrating improved activity tolerance, progressing gait distance with improved balance without RW support. Pt able to accept mild balance challenges with no AD support with mild postural sway however no overt LOB noted. Discussed with supervising PT and updated recommendation as pt with great progress. Pt continues to benefit from skilled PT services to progress toward functional mobility goals.      If plan is discharge home, recommend the following: A little help with walking and/or transfers;A little help with bathing/dressing/bathroom;Help with stairs or ramp for entrance;Assist for transportation   Can travel by private vehicle        Equipment Recommendations  None recommended by PT    Recommendations for Other Services       Precautions / Restrictions Precautions Precautions: Fall Restrictions Weight Bearing Restrictions Per Provider Order: No     Mobility  Bed Mobility Overal bed mobility: Needs Assistance Bed Mobility: Supine to Sit, Sit to Supine     Supine to sit: Supervision Sit to supine: Supervision   General bed mobility comments: supervision for safety    Transfers Overall transfer level: Needs assistance Equipment used: None Transfers: Sit to/from Stand Sit to Stand: Contact guard assist, Supervision           General transfer comment: CGA fading to supervision standing from EOB and low commode    Ambulation/Gait Ambulation/Gait assistance: Contact guard assist, Supervision Gait Distance (Feet): 350 Feet (x2) Assistive device: Rolling walker (2 wheels), None Gait Pattern/deviations:  WFL(Within Functional Limits)       General Gait Details: pt ambulating with good stability with RW for x1 bout, short seated rest and pt eager to trial no AD support, no overt LOB without AD support, pt with recirocal pattern and symmetrical steps   Stairs             Wheelchair Mobility     Tilt Bed    Modified Rankin (Stroke Patients Only)       Balance Overall balance assessment: Mild deficits observed, not formally tested                                          Communication Communication Communication: No apparent difficulties Factors Affecting Communication: Hearing impaired  Cognition Arousal: Alert Behavior During Therapy: WFL for tasks assessed/performed   PT - Cognitive impairments: No apparent impairments                         Following commands: Intact      Cueing    Exercises      General Comments        Pertinent Vitals/Pain Pain Assessment Pain Assessment: Faces Faces Pain Scale: Hurts a little bit Pain Location: incision with sitting Pain Descriptors / Indicators: Discomfort, Guarding, Sore, Grimacing Pain Intervention(s): Monitored during session, Limited activity within patient's tolerance    Home Living                          Prior Function  PT Goals (current goals can now be found in the care plan section) Acute Rehab PT Goals PT Goal Formulation: With patient/family Time For Goal Achievement: 03/18/24 Progress towards PT goals: Progressing toward goals    Frequency    Min 3X/week      PT Plan      Co-evaluation              AM-PAC PT 6 Clicks Mobility   Outcome Measure  Help needed turning from your back to your side while in a flat bed without using bedrails?: A Little Help needed moving from lying on your back to sitting on the side of a flat bed without using bedrails?: A Little Help needed moving to and from a bed to a chair (including a  wheelchair)?: None Help needed standing up from a chair using your arms (e.g., wheelchair or bedside chair)?: None Help needed to walk in hospital room?: A Little Help needed climbing 3-5 steps with a railing? : A Little 6 Click Score: 20    End of Session Equipment Utilized During Treatment: Gait belt Activity Tolerance: Patient tolerated treatment well Patient left: with call bell/phone within reach;in bed;with nursing/sitter in room Nurse Communication: Mobility status PT Visit Diagnosis: Unsteadiness on feet (R26.81);Other abnormalities of gait and mobility (R26.89);Muscle weakness (generalized) (M62.81);Pain Pain - Right/Left:  (abdomen) Pain - part of body:  (abdomen)     Time: 8846-8770 PT Time Calculation (min) (ACUTE ONLY): 36 min  Charges:    $Gait Training: 23-37 mins PT General Charges $$ ACUTE PT VISIT: 1 Visit                     Taimur Fier R. PTA Acute Rehabilitation Services Office: [phone]   Therisa CHRISTELLA Boor 03/09/2024, 1:39 PM

## 2024-03-09 NOTE — Progress Notes (Addendum)
 [facility] Progress Note  6 Days Post-Op  Subjective: Foley functioning well. Patient states she is passing flatus and having loose stools.   Objective: Vital signs in last 24 hours: Temp:  [97.3 F (36.3 C)-98.7 F (37.1 C)] 98.1 F (36.7 C) (07/14 0815) Pulse Rate:  [66-80] 66 (07/14 0815) Resp:  [16-17] 17 (07/14 0815) BP: (127-148)/(62-85) 148/71 (07/14 0815) SpO2:  [96 %-99 %] 99 % (07/14 0815) Last BM Date : 03/08/24  Intake/Output from previous day: 07/13 0701 - 07/14 0700 In: 460 [P.O.:460] Out: 1100 [Urine:1100] Intake/Output this shift: Total I/O In: -  Out: 400 [Urine:400]  PE: Gen:  Alert, NAD, pleasant Card:  Regular rate and rhythm, no lower extremity edema  Pulm:  Normal effort on room air Abd: Soft, appropriately tender to palpation. Skin: warm and dry, no rashes  Psych: A&Ox3   Lab Results:  Recent Labs    03/07/24 0717 03/08/24 0859  WBC 7.1 6.4  HGB 11.7* 11.9*  HCT 35.7* 35.7*  PLT 286 359   BMET Recent Labs    03/07/24 0717 03/08/24 0859  NA 136 137  K 4.2 3.9  CL 105 101  CO2 19* 26  GLUCOSE 88 137*  BUN 10 9  CREATININE 0.73 0.69  CALCIUM 9.0 9.4   PT/INR No results for input(s): LABPROT, INR in the last 72 hours. CMP     Component Value Date/Time   NA 137 03/08/2024 0859   NA 139 11/11/2015 0000   K 3.9 03/08/2024 0859   CL 101 03/08/2024 0859   CO2 26 03/08/2024 0859   GLUCOSE 137 (H) 03/08/2024 0859   BUN 9 03/08/2024 0859   BUN 20 11/11/2015 0000   CREATININE 0.69 03/08/2024 0859   CREATININE 0.88 05/27/2017 1417   CALCIUM 9.4 03/08/2024 0859   PROT 7.4 03/02/2024 2228   ALBUMIN 3.0 (L) 03/05/2024 1511   AST 14 (L) 03/02/2024 2228   ALT 12 03/02/2024 2228   ALKPHOS 59 03/02/2024 2228   BILITOT 0.4 03/02/2024 2228   GFRNONAA >60 03/08/2024 0859   GFRNONAA 64 05/27/2017 1417   GFRAA 74 05/27/2017 1417   Lipase     Component Value Date/Time   LIPASE 21 03/02/2024 2228        Studies/Results: No results found.   Anti-infectives: Anti-infectives (From admission, onward)    Start     Dose/Rate Route Frequency Ordered Stop   03/06/24 1400  cefTRIAXone  (ROCEPHIN ) 2 g in sodium chloride  0.9 % 100 mL IVPB        2 g 200 mL/hr over 30 Minutes Intravenous Every 24 hours 03/06/24 0903 03/07/24 1436   03/05/24 1400  piperacillin -tazobactam (ZOSYN ) IVPB 2.25 g        2.25 g 100 mL/hr over 30 Minutes Intravenous Every 8 hours 03/05/24 1247 03/06/24 0535   03/05/24 1130  piperacillin -tazobactam (ZOSYN ) IVPB 3.375 g  Status:  Discontinued        3.375 g 12.5 mL/hr over 240 Minutes Intravenous Every 8 hours 03/05/24 1112 03/05/24 1247   03/03/24 1400  piperacillin -tazobactam (ZOSYN ) IVPB 3.375 g        3.375 g 12.5 mL/hr over 240 Minutes Intravenous Every 8 hours 03/03/24 1259 03/05/24 0751   03/03/24 0645  ceFAZolin  (ANCEF ) IVPB 2g/100 mL premix        2 g 200 mL/hr over 30 Minutes Intravenous On call to O.R. 03/03/24 0640 03/03/24 0740        Assessment/Plan Incarcerated left femoral hernia  POD#6  s/p diagnostic laparoscopy, left femoral hernia repair with mesh, SBR with primary anastomosis 7/8 Dr. Ann - foley D/C-ed 7/13, was replaced overnight. Spoke with urology and it sounds like it was replaced without true evidence of retention, repeat TOV today. Appreciate urology care. - Cont Reg diet, add bowel reg - UA with small bacteria, culture with no growth  FEN: RD ID: Zosyn  resumed 7/10 due to clinical concern for abscess, narrowed 7/11 to ceftriaxone  for UTI VTE: SCD's, Lovenox  Foley: d/c and check PVRs Dispo: med-surg, pain control, urology consult - plan outpatient  TOV Tentative plan for discharge home tomorrow 7/15 if having ongoing bowel function and pain controlled     LOS: 6 days   I reviewed nursing notes, last 24 h vitals and pain scores, last 48 h intake and output, last 24 h labs and trends, and last 24 h imaging  results.  Almarie Pringle, PA-C General Surgery [facility]

## 2024-03-09 NOTE — Progress Notes (Signed)
  Progress Note   Date: 03/09/2024  Patient Name: Jillian Norton        MRN#: [id number]  The diagnosis of AKI     AKI, now resolved (document the clinical data/criteria used for diagnosing) Creatinine down to 0.91 with treatment

## 2024-03-09 NOTE — Progress Notes (Signed)
 Pt has voided twice and had a small BM per her report.  I was not aware when this happened but told her to let me know once she voids again so I can do a PVR bladder scan.  Pt tolerated a self shower well and would like to go back to her independent living apt at [facility] once discharged.  She states she has a call cord right at bedside that she can pull anytime.  She also stated that if she needs any services post discharge once she get back to [facility], she can ask for those and receive them.

## 2024-03-09 NOTE — Consult Note (Signed)
 Urology Consult Note   Requesting Attending Physician:  Kristopher, Md, MD Service Providing Consult: Urology  Consulting Attending: Dr. Shane   Reason for Consult:  post operative urinary retention  HPI: Jillian Norton is seen in consultation for reasons noted above at the request of Vertell Pringle, PA.  Patient is an 83 year old female initially transferred from [facility] with lower abdominal pain and vomiting x 1 day.  She was ultimately found to have an incarcerated left femoral hernia containing bowel.  This resulted in exploratory laparotomy, left inguinal repair with mesh and small bowel resection with primary anastomosis on 03/03/2024.  She did have a brief period of urinary retention postoperatively which was temporized with placement of Foley catheter.  Foley was removed and patient was voiding with PVR of 25 mL recorded.  Sometime yesterday afternoon she stood up and experienced what she described as the worst pain she has ever felt on her right lower quadrant.  This then radiated circumferentially around her abdomen.  She received Toradol  and pain eventually subsided completely.  A few hours later she elected to have the Foley catheter placed because she was not sure if she was going back into urinary retention.  She has requested urology consult to discuss possible causes and next steps.  On my arrival patient was alert, oriented, and in no distress.  She reports no prior urologic history though was having some frequency and urgency just prior to her hospitalization, which presumably is secondary to her incarcerated hernia.  Foley catheter was freely draining clear yellow urine.  We had a long conversation about what possible causes for her discomfort could have been from.  It would be very hard to differentiate spontaneous groin pain on the contralateral side, though we discussed postoperative pain from insufflation gas and causes for urinary retention.  Patient expressed  understanding and overwhelmingly felt better after our conversation.  All questions were answered to her satisfaction.  ------------------  Assessment:   83 y.o. female POD 6 s/p diagnostic laparoscopy with left femoral hernia repair with mesh and SBR with primary anastomosis.  Transient urinary retention postoperatively.   Recommendations: # Undifferentiated flank pain  After a long conversation with Mrs. Shrake it would be difficult to say exactly what her right flank pain was coming from but considering that her recorded PVR was 25 mL, the pain was on the contralateral side, and had completely resolved several hours prior to the Foley catheter being placed, I am not inclined to think this was from a urinary source.  Neither CT A/P from 7/8 or 7/10 reflect evidence of obstructive uropathy.  No concern for urinary infection.  Unremarkable urinalysis on 7/8 and 7/10.  NGTD on culture.  Patient is amenable to removing Foley catheter while still in the hospital.  Should she actually go back into retention then we would have the Foley replaced and she will call to schedule TOV for 1 week when she arrives home.  Her scheduled oxybutynin  could increase the chance of this happening.  Has been exchanged for as needed hyoscyamine .  Case and plan discussed with Dr. Shane, General Surgery  Past Medical History: Past Medical History:  Diagnosis Date   Anxiety    Breast cancer (HCC) 06/27/2012   Right   Cataract    DVT (deep venous thrombosis) (HCC) 06/28/2015   Dyslipidemia    GERD (gastroesophageal reflux disease)    Insomnia    Osteopenia    Overactive bladder    Personal history of  radiation therapy    Shingles 06/2016   Thyroid  disease     Past Surgical History:  Past Surgical History:  Procedure Laterality Date   BOWEL RESECTION N/A 03/03/2024   Procedure: EXCISION, SMALL INTESTINE;  Surgeon: Ann Fine, MD;  Location: [facility] OR;  Service: General;  Laterality: N/A;    BREAST LUMPECTOMY Right 2014   BREAST LUMPECTOMY  2013   right   CATARACT EXTRACTION W/ INTRAOCULAR LENS IMPLANT Bilateral    COLONOSCOPY  08/27/2012   [place]    INGUINAL HERNIA REPAIR N/A 03/03/2024   Procedure: REPAIR, HERNIA, INGUINAL/FEMORAL, ADULT;  Surgeon: Ann Fine, MD;  Location: [facility] OR;  Service: General;  Laterality: N/A;  LEFT INGUINAL HERNIA  REPAIR POSSIBLE LAPAROTOMY WITH SMALL BOWEL RESECTION   INSERTION OF MESH Left 03/03/2024   Procedure: INSERTION OF VICRYL MESH, LEFT GROIN;  Surgeon: Ann Fine, MD;  Location: [facility] OR;  Service: General;  Laterality: Left;   KNEE ARTHROSCOPY     left   LAPAROSCOPIC ABDOMINAL EXPLORATION N/A 03/03/2024   Procedure: EXPLORATION, ABDOMEN, LAPAROSCOPIC;  Surgeon: Ann Fine, MD;  Location: [facility] OR;  Service: General;  Laterality: N/A;   LASIK Bilateral    REDUCTION MAMMAPLASTY Bilateral 1985   anchor scar    VAGINAL HYSTERECTOMY  2002    Medication: Current Facility-Administered Medications  Medication Dose Route Frequency Provider Last Rate Last Admin   acetaminophen  (TYLENOL ) tablet 1,000 mg  1,000 mg Oral Q6H Ann Fine, MD   1,000 mg at 03/08/24 2358   albuterol  (PROVENTIL ) (2.5 MG/3ML) 0.083% nebulizer solution 2.5 mg  2.5 mg Inhalation BID PRN Teresa Lonni HERO, MD   2.5 mg at 03/07/24 2103   buPROPion  (WELLBUTRIN  XL) 24 hr tablet 300 mg  300 mg Oral q AM Simaan, Elizabeth S, PA-C   300 mg at 03/09/24 0600   Chlorhexidine  Gluconate Cloth 2 % PADS 6 each  6 each Topical Daily Augustus Almarie RAMAN, PA-C   6 each at 03/09/24 1039   enoxaparin  (LOVENOX ) injection 40 mg  40 mg Subcutaneous Q24H Ann Fine, MD       feeding supplement (ENSURE PLUS HIGH PROTEIN) liquid 237 mL  237 mL Oral BID BM Simaan, Elizabeth S, PA-C   237 mL at 03/08/24 1304   guaiFENesin  (MUCINEX ) 12 hr tablet 600 mg  600 mg Oral BID Simaan, Elizabeth S, PA-C   600 mg at 03/08/24 2054   hydrALAZINE  (APRESOLINE ) injection 10 mg  10 mg Intravenous  Q2H PRN Ann Fine, MD       HYDROmorphone  (DILAUDID ) injection 1 mg  1 mg Intravenous Q4H PRN Ann Fine, MD   1 mg at 03/08/24 2103   hyoscyamine  (ANASPAZ ) disintergrating tablet 0.125 mg  0.125 mg Sublingual Q4H PRN Augustus Almarie RAMAN, PA-C       lidocaine  (LIDODERM ) 5 % 2 patch  2 patch Transdermal Daily Simaan, Elizabeth S, PA-C   2 patch at 03/08/24 0827   methocarbamol  (ROBAXIN ) tablet 500 mg  500 mg Oral TID Simaan, Elizabeth S, PA-C   500 mg at 03/08/24 2054   ondansetron  (ZOFRAN -ODT) disintegrating tablet 4 mg  4 mg Oral Q6H PRN Ann Fine, MD       Or   ondansetron  (ZOFRAN ) injection 4 mg  4 mg Intravenous Q6H PRN Ann Fine, MD   4 mg at 03/05/24 9571   Oral care mouth rinse  15 mL Mouth Rinse PRN Ann Fine, MD       oxyCODONE  (Oxy IR/ROXICODONE ) immediate release tablet 5 mg  5 mg Oral Q4H PRN Ann Fine, MD   5 mg at 03/08/24 1749   pantoprazole  (PROTONIX ) injection 40 mg  40 mg Intravenous Q24H Zelphia Randall PEDLAR, [facility]       Or   pantoprazole  (PROTONIX ) EC tablet 40 mg  40 mg Oral Q24H Zhou, Jenny Z, RPH   40 mg at 03/08/24 9185   polyethylene glycol (MIRALAX  / GLYCOLAX ) packet 17 g  17 g Oral Daily PRN Ann Fine, MD       senna-docusate (Senokot-S) tablet 1 tablet  1 tablet Oral BID Simaan, Elizabeth S, PA-C       zolpidem  (AMBIEN ) tablet 5-10 mg  5-10 mg Oral QHS PRN Simaan, Elizabeth S, PA-C   10 mg at 03/08/24 2054    Allergies: Allergies  Allergen Reactions   Sporanos [Itraconazole] Anaphylaxis   Tamoxifen Anaphylaxis    Gave her a blood clot   Anastrozole  Other (See Comments)    Unknown, pt was unaware of this allergy    Sulfa Antibiotics Nausea And Vomiting    Social History: Social History   Tobacco Use   Smoking status: Former    Current packs/day: 1.00    Average packs/day: 1 pack/day for 10.0 years (10.0 ttl pk-yrs)    Types: Cigarettes   Smokeless tobacco: Never  Vaping Use   Vaping status: Never Used  Substance  Use Topics   Alcohol  use: Yes    Comment: 1-2 drinks per day   Drug use: No    Family History Family History  Problem Relation Age of Onset   Lung cancer Mother    Colon cancer Father     Review of Systems  Genitourinary:  Positive for flank pain. Negative for dysuria, frequency, hematuria and urgency.     Objective   Vital signs in last 24 hours: BP (!) 148/71 (BP Location: Left Arm)   Pulse 66   Temp 98.1 F (36.7 C) (Oral)   Resp 17   Ht 5' 4 (1.626 m)   Wt 67.7 kg   SpO2 99%   BMI 25.62 kg/m   Physical Exam General: A&O, resting, appropriate HEENT: [place]/AT Pulmonary: Normal work of breathing Cardiovascular: no cyanosis Abdomen: Soft, NTTP, non-distended. LLQ incision and epigastric port site C/D/I GU: foley in place draining clear yellow urine Neuro: Appropriate, no focal neurological deficits  Most Recent Labs: Lab Results  Component Value Date   WBC 6.4 03/08/2024   HGB 11.9 (L) 03/08/2024   HCT 35.7 (L) 03/08/2024   PLT 359 03/08/2024    Lab Results  Component Value Date   NA 137 03/08/2024   K 3.9 03/08/2024   CL 101 03/08/2024   CO2 26 03/08/2024   BUN 9 03/08/2024   CREATININE 0.69 03/08/2024   CALCIUM 9.4 03/08/2024   MG 2.1 03/09/2024   PHOS 3.7 03/09/2024    No results found for: INR, APTT   Urine Culture: @[id number] (laburin,org,[id number],[id number])@   IMAGING: No results found.  ------  Ole Bourdon, NP Pager: [phone]   Please contact the urology consult pager with any further questions/concerns.

## 2024-03-09 NOTE — Progress Notes (Signed)
 Pt has voided several times today and this last time, she voided 100 cc rose urine and then I bladder scanned her for 14 ml. She states there was a little burning but it was better post void.

## 2024-03-10 ENCOUNTER — Other Ambulatory Visit (HOSPITAL_BASED_OUTPATIENT_CLINIC_OR_DEPARTMENT_OTHER): Payer: Self-pay

## 2024-03-10 LAB — PHOSPHORUS: Phosphorus: 3 mg/dL (ref 2.5–4.6)

## 2024-03-10 LAB — MAGNESIUM: Magnesium: 2 mg/dL (ref 1.7–2.4)

## 2024-03-10 MED ORDER — METHOCARBAMOL 500 MG PO TABS
500.0000 mg | ORAL_TABLET | Freq: Three times a day (TID) | ORAL | 0 refills | Status: DC | PRN
  Filled 2024-03-10: qty 30, 10d supply, fill #0

## 2024-03-10 MED ORDER — OXYCODONE HCL 5 MG PO TABS
5.0000 mg | ORAL_TABLET | Freq: Four times a day (QID) | ORAL | 0 refills | Status: DC | PRN
  Filled 2024-03-10: qty 10, 3d supply, fill #0

## 2024-03-10 MED ORDER — SENNOSIDES-DOCUSATE SODIUM 8.6-50 MG PO TABS
1.0000 | ORAL_TABLET | Freq: Every day | ORAL | 0 refills | Status: AC
  Filled 2024-03-10: qty 14, 14d supply, fill #0

## 2024-03-10 MED ORDER — ACETAMINOPHEN 500 MG PO TABS: 1000.0000 mg | ORAL_TABLET | Freq: Four times a day (QID) | ORAL | Status: AC | PRN

## 2024-03-10 NOTE — Discharge Summary (Signed)
 [facility] Discharge Summary   Patient ID: Jillian Norton MRN: [id number] DOB/AGE: 04-Apr-1941 83 y.o.  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 03/03/2024   Arthritis of carpometacarpal [facility]) joint of left thumb 03/05/2018   De Quervain's tenosynovitis, right 03/05/2018   Malignant neoplasm of upper-inner quadrant of right breast in female, estrogen receptor positive (HCC) 10/24/2017   Overweight (BMI 25.0-29.9) 04/07/2017   History of cancer of right breast 04/07/2017   History of DVT (deep vein thrombosis) 04/07/2017   GERD (gastroesophageal reflux disease) 11/01/2016   Primary insomnia 11/01/2016   Urge incontinence 11/01/2016   History of colon polyps 11/01/2016   Postnasal drip 11/01/2016   Shingles 06/27/2016    Consultants Urology   Imaging: No results found.  Procedures Dr. Ann left femoral hernia repair with mesh, dx laparoscopy, small bowel resection 03/03/24   HPI: Very pleasant and relatively healthy/active 83 year old woman who presents as a transfer from med center at [facility] with lower abdominal pain which began around 5 PM yesterday with 1 associated episode of vomiting.  Leading up to this, she did have a cold with a fair amount of coughing.  Has not eaten anything since yesterday morning.  Reports her last bowel movement/flatus was yesterday.  Has had ongoing pain now localized to the lower abdomen and left groin.  Found to have an incarcerated left femoral hernia containing bowel and transferred to [facility] for further evaluation.   Hospital Course:  Patient was admitted and underwent procedure listed above.  Tolerated procedure well and was transferred to the floor.  Diet was advanced as tolerated.  On POD#1 the patient had signs of a mild illeus, AKI,  and urinary retention. She was continued on IVF and a liquid diet. On POD#2 her kidney function worsened, a foley was placed for retention, and NGT tube was placed for ileus. Due to abnormal post-operative suprapubic and labial swelling a CT scan of the abomen and pelvis was ordered which showed body wall edema, possible seroma, tiny bartholin gland cyst, and expected post-operative free air. Ileus improved and diet was advanced as tolerated. At patient request urology was consulted and recommended foley removal and outpatient  follow up. Patient completed a total of 3 days IV antibiotics for possible UTI.    On the day of discharge the patient was voiding , tolerating diet, ambulating well, pain well controlled, vital signs stable, incisions c/d/i and felt stable for discharge home.  Patient will follow up in our office as below weeks and knows to call with questions or concerns.    I have personally reviewed the patients medication history on the [place] controlled substance database.    Physical Exam: General:  Alert, NAD, pleasant, comfortable Abd:  Soft, ND, mild tenderness, incisions C/D/I  Allergies as of 03/10/2024       Reactions   Sporanos [itraconazole] Anaphylaxis   Tamoxifen Anaphylaxis   Gave her a blood clot   Anastrozole  Other (See Comments)   Unknown, pt was unaware of this allergy    Sulfa Antibiotics Nausea And Vomiting        Medication List     STOP taking these medications    oxybutynin  10 MG 24 hr tablet Commonly known as: DITROPAN -XL       TAKE these medications    acetaminophen  500 MG tablet  Commonly known as: TYLENOL  Take 2 tablets (1,000 mg total) by mouth every 6 (six) hours as needed. What changed:  medication strength how much to take when to take this reasons to take this   albuterol  108 (90 Base) MCG/ACT inhaler Commonly known as: VENTOLIN  HFA Inhale 2 puffs into the lungs twice daily as needed (Use 2 puffs 2x a day prn Inhalation as  directed 30 days) What changed:  how much to take when to take this reasons to take this   buPROPion  300 MG 24 hr tablet Commonly known as: WELLBUTRIN  XL Take 1 tablet (300 mg total) by mouth in the morning.   busPIRone  5 MG tablet Commonly known as: BUSPAR  Take 1 tablet (5 mg total) by mouth daily.   EPINEPHrine  0.3 mg/0.3 mL Soaj injection Commonly known as: EpiPen  2-Pak Inject as directed for allergic reaction (as directed Injection daily 1 days)   methocarbamol  500 MG tablet Commonly known as: ROBAXIN  Take 1 tablet (500 mg total) by mouth every 8 (eight) hours as needed for muscle spasms.   moxifloxacin  0.5 % ophthalmic solution Commonly known as: VIGAMOX  Instill 1 drop in right eye every hour while awake for 24 hours, then use 4 times daily   omeprazole  40 MG capsule Commonly known as: PRILOSEC Take 1 capsule (40 mg total) by mouth daily.   oxyCODONE  5 MG immediate release tablet Commonly known as: Oxy IR/ROXICODONE  Take 1 tablet (5 mg total) by mouth every 6 (six) hours as needed for severe pain (pain score 7-10) (not releived by tylenol  or robaxin ).   senna-docusate 8.6-50 MG tablet Commonly known as: Senokot-S Take 1 tablet by mouth at bedtime.   traMADol  50 MG tablet Commonly known as: ULTRAM  Take 1 tablet (50 mg total) by mouth every 6 (six) hours as needed for pain for 5 days. What changed:  when to take this reasons to take this   traZODone  100 MG tablet Commonly known as: DESYREL  Take 1 tablet (100 mg total) by mouth at bedtime.   triamcinolone  cream 0.1 % Commonly known as: KENALOG  Apply 1 application on the skin twice a day Apply thin layer to affected areas twice a day x 1-3 weeks as needed. What changed:  how much to take when to take this reasons to take this   valACYclovir  1000 MG tablet Commonly known as: Valtrex  Take 1 tablet (1,000 mg total) by mouth 2 (two) times daily for 4 days as needed for cold sores.   Voltaren 1 % Gel Generic  drug: diclofenac Sodium Apply 1 Application topically daily as needed (pain).   Wegovy  2.4 MG/0.75ML Soaj Generic drug: Semaglutide -Weight Management Inject 2.4 mg into the skin once a week as directed   Zepbound  10 MG/0.5ML Pen Generic drug: tirzepatide  Inject 10 mg into the skin once a week.   zolpidem  5 MG tablet Commonly known as: Ambien  Take 1-2 tablets (5-10 mg total) by mouth at bedtime as needed as directed What changed:  how much to take reasons to take this          Follow-up Information     Ann Fine, MD Follow up.   Specialty: General Surgery Why: our office is scheduling you for follow up in 3-4 weeks. Contact information: [address] [place] [address] [phone]         Nieves Cough, MD. Schedule an appointment as soon as possible for a visit in 3 week(s).   Specialty: Urology Why: for urinary retention evaluation Contact information:  [address] [place] [address] [phone]                 Signed: Almarie Pringle, [facility] 03/10/2024, 9:51 AM

## 2024-03-10 NOTE — TOC Transition Note (Addendum)
 Transition of Care [facility]) - Discharge Note   Patient Details  Name: Jillian Norton MRN: [id number] Date of Birth: 23-Dec-1940  Transition of Care [facility]) CM/SW Contact:  Andrez JULIANNA George, RN Phone Number: 03/10/2024, 10:12 AM   Clinical Narrative:     Pt is discharging back to [facility].  No DME needs. No follow up per PT. She has transportation home.   Final next level of care: Home/Self Care Barriers to Discharge: No Barriers Identified   Patient Goals and CMS Choice Patient states their goals for this hospitalization and ongoing recovery are:: To get better          Discharge Placement                       Discharge Plan and Services Additional resources added to the After Visit Summary for   In-house Referral: Clinical Social Work Discharge Planning Services: CM Consult                                 Social Drivers of Health (SDOH) Interventions SDOH Screenings   Food Insecurity: No Food Insecurity (03/03/2024)  Housing: Unknown (03/03/2024)  Transportation Needs: No Transportation Needs (03/03/2024)  Utilities: Not At Risk (03/03/2024)  Social Connections: Moderately Isolated (03/03/2024)  Tobacco Use: Medium Risk (03/03/2024)     Readmission Risk Interventions     No data to display

## 2024-03-10 NOTE — Progress Notes (Signed)
   7 Days Post-Op Subjective: No acute events overnight.  Jillian Norton is in good spirits and looks great today.  She reports that she is back to baseline and ready to discharge home.  Objective: Vital signs in last 24 hours: Temp:  [98.3 F (36.8 C)-98.5 F (36.9 C)] 98.5 F (36.9 C) (07/15 1010) Pulse Rate:  [76-87] 80 (07/15 1010) Resp:  [17-18] 18 (07/15 1010) BP: (116-118)/(63-66) 117/66 (07/15 1010) SpO2:  [97 %-99 %] 99 % (07/15 1010)  Assessment/Plan: # Postoperative urinary retention Successful voiding trial yesterday.  Patient reports no sensation of straining, incomplete emptying, hematuria, or pain.  She feels that she is back to baseline.  She will follow-up with [facility] should she have any return of symptoms. Urology will sign off at this time.  Please feel free to call with questions or concerns.  Intake/Output from previous day: 07/14 0701 - 07/15 0700 In: 580 [P.O.:580] Out: 600 [Urine:600]  Intake/Output this shift: No intake/output data recorded.  Physical Exam:  General: Alert and oriented CV: No cyanosis Lungs: equal chest rise Abdomen: Midline periumbilical port incision, left groin incision C/D/I.  No erythema or purulent drainage Gu: Foley removed  Lab Results: Recent Labs    03/08/24 0859  HGB 11.9*  HCT 35.7*   BMET Recent Labs    03/08/24 0859  NA 137  K 3.9  CL 101  CO2 26  GLUCOSE 137*  BUN 9  CREATININE 0.69  CALCIUM 9.4  HGB 11.9*  WBC 6.4     Studies/Results: No results found.    LOS: 7 days   Ole Bourdon, NP [facility] Pager: [phone]  03/10/2024, 10:37 AM

## 2024-03-11 ENCOUNTER — Encounter: Payer: Self-pay | Admitting: Internal Medicine

## 2024-03-11 ENCOUNTER — Non-Acute Institutional Stay (SKILLED_NURSING_FACILITY): Payer: Self-pay | Admitting: Internal Medicine

## 2024-03-11 DIAGNOSIS — F5101 Primary insomnia: Secondary | ICD-10-CM | POA: Diagnosis not present

## 2024-03-11 DIAGNOSIS — Z9889 Other specified postprocedural states: Secondary | ICD-10-CM | POA: Diagnosis not present

## 2024-03-11 DIAGNOSIS — K219 Gastro-esophageal reflux disease without esophagitis: Secondary | ICD-10-CM

## 2024-03-11 DIAGNOSIS — R052 Subacute cough: Secondary | ICD-10-CM

## 2024-03-11 DIAGNOSIS — F32A Depression, unspecified: Secondary | ICD-10-CM | POA: Diagnosis not present

## 2024-03-11 DIAGNOSIS — Z8719 Personal history of other diseases of the digestive system: Secondary | ICD-10-CM

## 2024-03-11 NOTE — Progress Notes (Unsigned)
 Provider:    Charlanne Fredia CROME, MD  Location:  [facility] Room Number: 155-P Place of Service:  SNF ([phone])  PCP: [name], MD Patient Care Team: [name], MD as PCP - General (Internal Medicine) Magrinat, Sandria BROCKS, MD (Inactive) as Consulting Physician (Oncology) [name], DO as Consulting Physician (Geriatric Medicine) [name], MD as Consulting Physician (Orthopedic Surgery)  Extended Emergency Contact Information Primary Emergency Contact: Adrienne Credit  [place] Home Phone: [phone] Relation: Daughter  Code Status: Full Code Goals of Care: Advanced Directive information    03/11/2024    3:51 PM  Advanced Directives  Does Patient Have a Medical Advance Directive? Yes  Type of Estate agent of [place];Living will;Out of facility DNR (pink MOST or yellow form)  Copy of Healthcare Power of Attorney in Chart? Yes - validated most recent copy scanned in chart (See row information)      Chief Complaint  Patient presents with   New Admit To SNF    HPI: Patient is a 83 y.o. female seen today for admission to Rehab [place] Was admitted in the hospital from 7/8 to 7/15 for Incarcerated left likely femoral hernia with partial obstruction Needing  Left femoral hernia repair with mesh, diagnostic laparoscopy, small bowel resection with primary anastomosis   Post op Patient has urinary Retention needing brief placement of Foley catheter Patient also had Pain in her Right side which Urology thought was due to Urinary spasms  Patient was eventually discharged But at home she continued to feel pain and swelling in her Incision site and decided to come to Rehab to rest  She is doing well  Walking with no assist Bowels are moving Her Appetite is coming back No Issues Urinary Doe shave a cough which has been there for few days Not Productive No SOB No Fever Past Medical History:   Diagnosis Date   Anxiety    Breast cancer (HCC) 06/27/2012   Right   Cataract    DVT (deep venous thrombosis) (HCC) 06/28/2015   Dyslipidemia    GERD (gastroesophageal reflux disease)    Insomnia    Osteopenia    Overactive bladder    Personal history of radiation therapy    Shingles 06/2016   Thyroid  disease    Past Surgical History:  Procedure Laterality Date   BOWEL RESECTION N/A 03/03/2024   Procedure: EXCISION, SMALL INTESTINE;  Surgeon: Ann Fine, MD;  Location: [facility] OR;  Service: General;  Laterality: N/A;   BREAST LUMPECTOMY Right 2014   BREAST LUMPECTOMY  2013   right   CATARACT EXTRACTION W/ INTRAOCULAR LENS IMPLANT Bilateral    COLONOSCOPY  08/27/2012   [place]    INGUINAL HERNIA REPAIR N/A 03/03/2024   Procedure: REPAIR, HERNIA, INGUINAL/FEMORAL, ADULT;  Surgeon: Ann Fine, MD;  Location: [facility] OR;  Service: General;  Laterality: N/A;  LEFT INGUINAL HERNIA  REPAIR POSSIBLE LAPAROTOMY WITH SMALL BOWEL RESECTION   INSERTION OF MESH Left 03/03/2024   Procedure: INSERTION OF VICRYL MESH, LEFT GROIN;  Surgeon: Ann Fine, MD;  Location: [facility] OR;  Service: General;  Laterality: Left;   KNEE ARTHROSCOPY     left   LAPAROSCOPIC ABDOMINAL EXPLORATION N/A 03/03/2024   Procedure: EXPLORATION, ABDOMEN, LAPAROSCOPIC;  Surgeon: Ann Fine, MD;  Location: [facility] OR;  Service: General;  Laterality: N/A;   LASIK Bilateral    REDUCTION MAMMAPLASTY Bilateral 1985   anchor scar    VAGINAL HYSTERECTOMY  2002    reports  that she has quit smoking. Her smoking use included cigarettes. She has a 10 pack-year smoking history. She has never used smokeless tobacco. She reports current alcohol  use. She reports that she does not use drugs. Social History   Socioeconomic History   Marital status: Significant Other    Spouse name: Not on file   Number of children: Not on file   Years of education: Not on file   Highest education level: Not on file  Occupational History   Not on file   Tobacco Use   Smoking status: Former    Current packs/day: 1.00    Average packs/day: 1 pack/day for 10.0 years (10.0 ttl pk-yrs)    Types: Cigarettes   Smokeless tobacco: Never  Vaping Use   Vaping status: Never Used  Substance and Sexual Activity   Alcohol  use: Yes    Comment: 1-2 drinks per day   Drug use: No   Sexual activity: Not Currently  Other Topics Concern   Not on file  Social History Narrative   Diet: General       Caffeine: Yes      Married, if yes what year: Widow, 1961/1988      Do you live in a house, apartment, assisted living, condo, trailer, etc: 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 n   Do you have difficulty preparing food or eating?n   Do you have difficulty managing your medications?n   Do you have difficulty managing your finances?n   Do you have difficulty affording your medications?n   Social Drivers of Health   Financial Resource Strain: Not on file  Food Insecurity: No Food Insecurity (03/03/2024)   Hunger Vital Sign    Worried About Running Out of Food in the Last Year: Never true    Ran Out of Food in the Last Year: Never true  Transportation Needs: No Transportation Needs (03/03/2024)   PRAPARE - Administrator, Civil Service (Medical): No    Lack of Transportation (Non-Medical): No  Physical Activity: Not on file  Stress: Not on file  Social Connections: Moderately Isolated (03/03/2024)   Social Connection and Isolation Panel    Frequency of Communication with Friends and Family: Once a week    Frequency of Social Gatherings with Friends and Family: Once a week    Attends Religious Services: Never    Database administrator or Organizations: No    Attends Banker Meetings: 1 to 4  times per year    Marital Status: Married  Catering manager Violence: Not At Risk (03/03/2024)   Humiliation, Afraid, Rape, and Kick questionnaire    Fear of Current or Ex-Partner: No    Emotionally Abused: No    Physically Abused: No    Sexually Abused: No    Functional Status Survey:    Family History  Problem Relation Age of Onset   Lung cancer Mother    Colon cancer Father     Health Maintenance  Topic Date Due   COVID-19 Vaccine (6 - 2024-25 season) 04/28/2023   INFLUENZA VACCINE  03/27/2024   Medicare Annual Wellness (AWV)  06/02/2024  DTaP/Tdap/Td (2 - Td or Tdap) 04/15/2028   Pneumococcal Vaccine: 50+ Years  Completed   DEXA SCAN  Completed   Zoster Vaccines- Shingrix  Completed   Hepatitis B Vaccines  Aged Out   HPV VACCINES  Aged Out   Meningococcal B Vaccine  Aged Out    Allergies  Allergen Reactions   Sporanos [Itraconazole] Anaphylaxis   Tamoxifen Anaphylaxis    Gave her a blood clot   Anastrozole  Other (See Comments)    Unknown, pt was unaware of this allergy    Sulfa Antibiotics Nausea And Vomiting    Outpatient Encounter Medications as of 03/11/2024  Medication Sig   acetaminophen  (TYLENOL ) 500 MG tablet Take 2 tablets (1,000 mg total) by mouth every 6 (six) hours as needed.   albuterol  (VENTOLIN  HFA) 108 (90 Base) MCG/ACT inhaler Use 2 puffs 2x a day prn Inhalation as directed 30 days   buPROPion  (WELLBUTRIN  XL) 300 MG 24 hr tablet Take 1 tablet (300 mg total) by mouth in the morning.   busPIRone  (BUSPAR ) 5 MG tablet Take 1 tablet (5 mg total) by mouth daily.   diclofenac Sodium (VOLTAREN) 1 % GEL Apply 1 Application topically daily as needed (pain).   EPINEPHrine  (EPIPEN  2-PAK) 0.3 mg/0.3 mL IJ SOAJ injection as directed Injection daily 1 days (Patient taking differently: as needed for anaphylaxis.)   methocarbamol  (ROBAXIN ) 500 MG tablet Take 1 tablet (500 mg total) by mouth every 8 (eight) hours as needed for muscle spasms.   omeprazole  (PRILOSEC)  40 MG capsule Take 1 capsule (40 mg total) by mouth daily.   oxyCODONE  (OXY IR/ROXICODONE ) 5 MG immediate release tablet Take 1 tablet (5 mg total) by mouth every 6 (six) hours as needed for severe pain (pain score 7-10) (not relieved by tylenol  or robaxin ).   Semaglutide -Weight Management (WEGOVY ) 2.4 MG/0.75ML SOAJ Inject 2.4 mg into the skin once a week as directed   senna-docusate (SENOKOT-S) 8.6-50 MG tablet Take 1 tablet by mouth at bedtime.   triamcinolone  cream (KENALOG ) 0.1 % Apply 1 application on the skin twice a day Apply thin layer to affected areas twice a day x 1-3 weeks as needed.   valACYclovir  (VALTREX ) 1000 MG tablet Take 1 tablet (1,000 mg total) by mouth 2 (two) times daily for 4 days as needed for cold sores.   zolpidem  (AMBIEN ) 5 MG tablet Take 1-2 tablets (5-10 mg total) by mouth at bedtime as needed as directed   moxifloxacin  (VIGAMOX ) 0.5 % ophthalmic solution Instill 1 drop in right eye every hour while awake for 24 hours, then use 4 times daily (Patient not taking: Reported on 03/11/2024)   tirzepatide  (ZEPBOUND ) 10 MG/0.5ML Pen Inject 10 mg into the skin once a week. (Patient not taking: Reported on 03/11/2024)   traMADol  (ULTRAM ) 50 MG tablet Take 1 tablet (50 mg total) by mouth every 6 (six) hours as needed for pain for 5 days. (Patient not taking: Reported on 03/11/2024)   traZODone  (DESYREL ) 100 MG tablet Take 1 tablet (100 mg total) by mouth at bedtime. (Patient not taking: Reported on 03/11/2024)   No facility-administered encounter medications on file as of 03/11/2024.    Review of Systems  Constitutional:  Negative for activity change and appetite change.  HENT: Negative.    Respiratory:  Negative for cough and shortness of breath.   Cardiovascular:  Negative for leg swelling.  Gastrointestinal:  Negative for constipation.  Genitourinary: Negative.   Musculoskeletal:  Negative for arthralgias, gait problem and myalgias.  Skin:  Negative.   Neurological:   Negative for dizziness and weakness.  Psychiatric/Behavioral:  Negative for confusion, dysphoric mood and sleep disturbance.     Vitals:   03/11/24 1506  BP: 122/78  Temp: 97.7 F (36.5 C)  SpO2: 95%  Weight: 139 lb 6.4 oz (63.2 kg)  Height: 5' 4 (1.626 m)   Body mass index is 23.93 kg/m. Physical Exam Vitals reviewed.  Constitutional:      Appearance: Normal appearance.  HENT:     Head: Normocephalic.     Nose: Nose normal.     Mouth/Throat:     Mouth: Mucous membranes are moist.     Pharynx: Oropharynx is clear.  Eyes:     Pupils: Pupils are equal, round, and reactive to light.  Cardiovascular:     Rate and Rhythm: Normal rate and regular rhythm.     Pulses: Normal pulses.     Heart sounds: Normal heart sounds. No murmur heard. Pulmonary:     Effort: Pulmonary effort is normal.     Breath sounds: Normal breath sounds. No wheezing or rales.  Abdominal:     General: Abdomen is flat. Bowel sounds are normal. There is no distension.     Palpations: Abdomen is soft.     Comments: Incision is Almost healed Swelling below the incision which is tender but not warm or Red  Musculoskeletal:        General: No swelling.     Cervical back: Neck supple.  Skin:    General: Skin is warm.  Neurological:     General: No focal deficit present.     Mental Status: She is alert and oriented to person, place, and time.  Psychiatric:        Mood and Affect: Mood normal.        Thought Content: Thought content normal.     Labs reviewed: Basic Metabolic Panel: Recent Labs    03/06/24 0654 03/07/24 0717 03/08/24 0859 03/09/24 0714 03/10/24 0637  NA 136 136 137  --   --   K 4.6 4.2 3.9  --   --   CL 106 105 101  --   --   CO2 19* 19* 26  --   --   GLUCOSE 98 88 137*  --   --   BUN 22 10 9   --   --   CREATININE 2.02* 0.73 0.69  --   --   CALCIUM 9.2 9.0 9.4  --   --   MG 2.0 2.0 2.0 2.1 2.0  PHOS 3.0 2.3* 2.4* 3.7 3.0   Liver Function Tests: Recent Labs     03/02/24 2228 03/05/24 1511  AST 14*  --   ALT 12  --   ALKPHOS 59  --   BILITOT 0.4  --   PROT 7.4  --   ALBUMIN 4.4 3.0*   Recent Labs    03/02/24 2228  LIPASE 21   No results for input(s): AMMONIA in the last 8760 hours. CBC: Recent Labs    03/06/24 0654 03/07/24 0717 03/08/24 0859  WBC 7.4 7.1 6.4  HGB 12.0 11.7* 11.9*  HCT 35.2* 35.7* 35.7*  MCV 89.3 92.5 89.9  PLT 244 286 359   Cardiac Enzymes: No results for input(s): CKTOTAL, CKMB, CKMBINDEX, TROPONINI in the last 8760 hours. BNP: Invalid input(s): POCBNP No results found for: HGBA1C Lab Results  Component Value Date   TSH 2.56 05/27/2017   No results found for: VITAMINB12 No results found for:  FOLATE No results found for: IRON, TIBC, FERRITIN  Imaging and Procedures obtained prior to SNF admission: DG Abd Portable 1V Result Date: 03/04/2024 CLINICAL DATA:  892607 Vomiting [id number].  Abdomen pain. EXAM: PORTABLE ABDOMEN - 1 VIEW COMPARISON:  None Available. FINDINGS: The bowel gas pattern is non-obstructive.  No abnormal stool burden. No evidence of pneumoperitoneum, within the limitations of a supine film. No acute osseous abnormalities. The soft tissues are within normal limits. Surgical changes, devices, tubes and lines: None. IMPRESSION: Nonobstructive bowel gas pattern. Electronically Signed   By: Ree Molt M.D.   On: 03/04/2024 12:00   CT ABDOMEN PELVIS W CONTRAST Result Date: 03/03/2024 CLINICAL DATA:  Bowel obstruction suspected. Left inguinal hernia. Lower abdominal and pelvic pain. Difficulty urinating. Nausea and vomiting. EXAM: CT ABDOMEN AND PELVIS WITH CONTRAST TECHNIQUE: Multidetector CT imaging of the abdomen and pelvis was performed using the standard protocol following bolus administration of intravenous contrast. RADIATION DOSE REDUCTION: This exam was performed according to the departmental dose-optimization program which includes automated exposure control, adjustment  of the mA and/or kV according to patient size and/or use of iterative reconstruction technique. CONTRAST:  85mL OMNIPAQUE  IOHEXOL  300 MG/ML  SOLN COMPARISON:  Ultrasound abdomen 08/09/2022 FINDINGS: Lower chest: Lung bases are clear. Hepatobiliary: No focal liver abnormality is seen. No gallstones, gallbladder wall thickening, or biliary dilatation. Pancreas: Unremarkable. No pancreatic ductal dilatation or surrounding inflammatory changes. Spleen: Normal in size without focal abnormality. Adrenals/Urinary Tract: Adrenal glands are unremarkable. Kidneys are normal, without renal calculi, focal lesion, or hydronephrosis. Bladder is unremarkable. Stomach/Bowel: Small left inguinal hernia containing a loop of small bowel with proximal dilated bowel loops with air-fluid levels consistent with proximal obstruction. Remainder of the small bowel, stomach, and colon are decompressed. No bowel wall thickening, pneumatosis, or portal venous gas. Appendix is normal. Vascular/Lymphatic: Aortic atherosclerosis. No enlarged abdominal or pelvic lymph nodes. Reproductive: Status post hysterectomy. No adnexal masses. Other: No free air or free fluid in the abdomen. Musculoskeletal: Degenerative changes in the spine. No acute bony abnormalities. IMPRESSION: 1. Left inguinal hernia containing small bowel with left lower quadrant anterior dilated small bowel with gas and fluid levels consistent with small-bowel obstruction. No ischemic changes are identified. 2. Aortic atherosclerosis. Electronically Signed   By: Elsie Gravely M.D.   On: 03/03/2024 02:50    Assessment/Plan 1. Status post hernia repair (Primary) Patient does have Swelling in that area which is tender CT done in 7/10 showed Seroma in that area But if Pain persists will follow with surgery Using only tylenol  PRN for pain 2. Subacute cough Lungs are clear Will Try Cough Syrup Prn for now  3. Gastroesophageal reflux disease without  esophagitis Prilosec  4. Depression, unspecified depression type Wellbutrin  and Buspar   5. Primary insomnia Ambien  PRN 6 Wegovy  is on hold for now    Family/ staff Communication:   Labs/tests ordered:

## 2024-03-13 NOTE — Progress Notes (Signed)
   PROVIDER:  RICHERD SILVERSMITH, MD  MRN: [id number] DOB: October 17, 1940 DATE OF ENCOUNTER: 03/13/2024 Interval History:     Patient is s/p open left femoral hernia repair with mesh, small bowel resection, and diagnostic laparoscopy on 03/03/24. Patient's post op course was complicated by urinary retention, AKI, ileus, UTI. She completed course of antibiotics inpatient for her UTI. At time of discharge, AKI, patient was voiding spontaneously and ileus had resolved. Was passing flatus and having bowel movements at time of discharge. Of note, patient did have some edema to lower abdominal wall that was felt to be reactive in nature to her surgery.  Since discharge patient states edema on right lower abdominal wall has resolved but still swollen and tender on left. Still passing flatus and having bowel movements. No nausea but does not have much of an appetite. She wanted to be evaluated for the persistent swelling on left side. No erythema, no drainage, no fevers, no chills.     Objective  Physical Exam Gen: NAD CV: RRR Pulm: NWOB Abd: Soft, tender to palpation around left surgical incision. Laparoscopic periumbilical port site with minimal ecchymosis and rest of abdomen soft and nontender. No evidence of hernia on exam both supine and standing and no hernia with valsalva.   Pathology SMALL BOWEL, RESECTION:  Segments of small bowel with hyperemia and focal hemorrhage consistent  with incarcerated inguinal hernia.   Assessment and Plan:     Jillian Norton is a 83 y.o. female who is s/p open left femoral hernia repair with mesh, small bowel resection, and diagnostic laparoscopy on 03/03/24.  There are no diagnoses linked to this encounter.   - I suspect area is still tender and swollen related to the extensive dissection from her operation.  - Will plan to see her back in 2 weeks to reevaluate area and see if swelling has at least starting to decrease. If no improvement at that time will consider CT  scan. - Recommended compressive garments to held with swelling and so that patient feels more secure with ambulation and movement. Also recommended icing area as needed to help with swelling.   The plan was discussed in detail with the patient today, who expressed understanding.  The patient has our contact information, and understands to call the clinic with any additional questions or concerns in the interval.  I would be happy to see the patient back.  RICHERD SILVERSMITH, MD

## 2024-03-18 ENCOUNTER — Emergency Department (HOSPITAL_COMMUNITY)

## 2024-03-18 ENCOUNTER — Other Ambulatory Visit: Payer: Self-pay

## 2024-03-18 ENCOUNTER — Encounter (HOSPITAL_COMMUNITY): Payer: Self-pay | Admitting: Emergency Medicine

## 2024-03-18 ENCOUNTER — Inpatient Hospital Stay (HOSPITAL_COMMUNITY)
Admission: EM | Admit: 2024-03-18 | Discharge: 2024-03-24 | DRG: 654 | Disposition: A | Discharge status: Skilled Nursing Facility | Source: Skilled Nursing Facility | Attending: Surgery | Admitting: Surgery

## 2024-03-18 ENCOUNTER — Other Ambulatory Visit (HOSPITAL_BASED_OUTPATIENT_CLINIC_OR_DEPARTMENT_OTHER): Payer: Self-pay | Admitting: General Surgery

## 2024-03-18 ENCOUNTER — Other Ambulatory Visit (HOSPITAL_BASED_OUTPATIENT_CLINIC_OR_DEPARTMENT_OTHER): Payer: Self-pay

## 2024-03-18 DIAGNOSIS — N9989 Other postprocedural complications and disorders of genitourinary system: Principal | ICD-10-CM | POA: Diagnosis present

## 2024-03-18 DIAGNOSIS — Z923 Personal history of irradiation: Secondary | ICD-10-CM

## 2024-03-18 DIAGNOSIS — S3720XA Unspecified injury of bladder, initial encounter: Secondary | ICD-10-CM | POA: Diagnosis not present

## 2024-03-18 DIAGNOSIS — N3289 Other specified disorders of bladder: Principal | ICD-10-CM

## 2024-03-18 DIAGNOSIS — Z87891 Personal history of nicotine dependence: Secondary | ICD-10-CM | POA: Diagnosis not present

## 2024-03-18 DIAGNOSIS — S3729XA Other injury of bladder, initial encounter: Secondary | ICD-10-CM | POA: Diagnosis present

## 2024-03-18 DIAGNOSIS — Z882 Allergy status to sulfonamides status: Secondary | ICD-10-CM | POA: Diagnosis not present

## 2024-03-18 DIAGNOSIS — Y838 Other surgical procedures as the cause of abnormal reaction of the patient, or of later complication, without mention of misadventure at the time of the procedure: Secondary | ICD-10-CM | POA: Diagnosis present

## 2024-03-18 DIAGNOSIS — Z91030 Bee allergy status: Secondary | ICD-10-CM

## 2024-03-18 DIAGNOSIS — R1084 Generalized abdominal pain: Secondary | ICD-10-CM | POA: Diagnosis not present

## 2024-03-18 DIAGNOSIS — G4700 Insomnia, unspecified: Secondary | ICD-10-CM | POA: Diagnosis not present

## 2024-03-18 DIAGNOSIS — K4090 Unilateral inguinal hernia, without obstruction or gangrene, not specified as recurrent: Secondary | ICD-10-CM | POA: Diagnosis not present

## 2024-03-18 DIAGNOSIS — Z801 Family history of malignant neoplasm of trachea, bronchus and lung: Secondary | ICD-10-CM

## 2024-03-18 DIAGNOSIS — Z7985 Long-term (current) use of injectable non-insulin antidiabetic drugs: Secondary | ICD-10-CM | POA: Diagnosis not present

## 2024-03-18 DIAGNOSIS — E785 Hyperlipidemia, unspecified: Secondary | ICD-10-CM | POA: Diagnosis present

## 2024-03-18 DIAGNOSIS — Z79899 Other long term (current) drug therapy: Secondary | ICD-10-CM | POA: Diagnosis not present

## 2024-03-18 DIAGNOSIS — Z853 Personal history of malignant neoplasm of breast: Secondary | ICD-10-CM

## 2024-03-18 DIAGNOSIS — R1030 Lower abdominal pain, unspecified: Secondary | ICD-10-CM

## 2024-03-18 DIAGNOSIS — F419 Anxiety disorder, unspecified: Secondary | ICD-10-CM | POA: Diagnosis not present

## 2024-03-18 DIAGNOSIS — Z86718 Personal history of other venous thrombosis and embolism: Secondary | ICD-10-CM | POA: Diagnosis not present

## 2024-03-18 DIAGNOSIS — Z800 Family history of malignant neoplasm of digestive organs: Secondary | ICD-10-CM | POA: Diagnosis not present

## 2024-03-18 DIAGNOSIS — G8918 Other acute postprocedural pain: Secondary | ICD-10-CM | POA: Diagnosis present

## 2024-03-18 DIAGNOSIS — R109 Unspecified abdominal pain: Secondary | ICD-10-CM | POA: Diagnosis not present

## 2024-03-18 DIAGNOSIS — Z888 Allergy status to other drugs, medicaments and biological substances status: Secondary | ICD-10-CM

## 2024-03-18 DIAGNOSIS — F17210 Nicotine dependence, cigarettes, uncomplicated: Secondary | ICD-10-CM | POA: Diagnosis not present

## 2024-03-18 DIAGNOSIS — Z9049 Acquired absence of other specified parts of digestive tract: Secondary | ICD-10-CM

## 2024-03-18 DIAGNOSIS — R188 Other ascites: Secondary | ICD-10-CM | POA: Diagnosis not present

## 2024-03-18 DIAGNOSIS — K219 Gastro-esophageal reflux disease without esophagitis: Secondary | ICD-10-CM | POA: Diagnosis not present

## 2024-03-18 DIAGNOSIS — Z90710 Acquired absence of both cervix and uterus: Secondary | ICD-10-CM | POA: Diagnosis not present

## 2024-03-18 DIAGNOSIS — T888XXA Other specified complications of surgical and medical care, not elsewhere classified, initial encounter: Secondary | ICD-10-CM

## 2024-03-18 DIAGNOSIS — R1032 Left lower quadrant pain: Secondary | ICD-10-CM | POA: Diagnosis not present

## 2024-03-18 LAB — CBC WITH DIFFERENTIAL/PLATELET
Abs Immature Granulocytes: 0.03 K/uL (ref 0.00–0.07)
Basophils Absolute: 0 K/uL (ref 0.0–0.1)
Basophils Relative: 1 %
Eosinophils Absolute: 0.1 K/uL (ref 0.0–0.5)
Eosinophils Relative: 1 %
HCT: 20.9 % — ABNORMAL LOW (ref 36.0–46.0)
Hemoglobin: 6.8 g/dL — CL (ref 12.0–15.0)
Immature Granulocytes: 0 %
Lymphocytes Relative: 29 %
Lymphs Abs: 2.2 K/uL (ref 0.7–4.0)
MCH: 30.1 pg (ref 26.0–34.0)
MCHC: 32.5 g/dL (ref 30.0–36.0)
MCV: 92.5 fL (ref 80.0–100.0)
Monocytes Absolute: 0.6 K/uL (ref 0.1–1.0)
Monocytes Relative: 7 %
Neutro Abs: 4.7 K/uL (ref 1.7–7.7)
Neutrophils Relative %: 62 %
Platelets: 653 K/uL — ABNORMAL HIGH (ref 150–400)
RBC: 2.26 MIL/uL — ABNORMAL LOW (ref 3.87–5.11)
RDW: 13.2 % (ref 11.5–15.5)
WBC: 7.7 K/uL (ref 4.0–10.5)
nRBC: 0 % (ref 0.0–0.2)

## 2024-03-18 LAB — URINALYSIS, W/ REFLEX TO CULTURE (INFECTION SUSPECTED)
Bilirubin Urine: NEGATIVE
Glucose, UA: NEGATIVE mg/dL
Ketones, ur: NEGATIVE mg/dL
Nitrite: NEGATIVE
Protein, ur: NEGATIVE mg/dL
Specific Gravity, Urine: 1.009 (ref 1.005–1.030)
WBC, UA: >50 WBC/hpf (ref 0–5)
pH: 6 (ref 5.0–8.0)

## 2024-03-18 LAB — COMPREHENSIVE METABOLIC PANEL WITH GFR
ALT: 17 U/L (ref 0–44)
AST: 12 U/L — ABNORMAL LOW (ref 15–41)
Albumin: 3 g/dL — ABNORMAL LOW (ref 3.5–5.0)
Alkaline Phosphatase: 65 U/L (ref 38–126)
Anion gap: 13 (ref 5–15)
BUN: 17 mg/dL (ref 8–23)
CO2: 22 mmol/L (ref 22–32)
Calcium: 9.4 mg/dL (ref 8.9–10.3)
Chloride: 102 mmol/L (ref 98–111)
Creatinine, Ser: 0.84 mg/dL (ref 0.44–1.00)
GFR, Estimated: >60 mL/min (ref >60)
Glucose, Bld: 90 mg/dL (ref 70–99)
Potassium: 3.8 mmol/L (ref 3.5–5.1)
Sodium: 137 mmol/L (ref 135–145)
Total Bilirubin: 0.7 mg/dL (ref 0.0–1.2)
Total Protein: 6.8 g/dL (ref 6.5–8.1)

## 2024-03-18 LAB — I-STAT CG4 LACTIC ACID, ED: Lactic Acid, Venous: 0.6 mmol/L (ref 0.5–1.9)

## 2024-03-18 LAB — LIPASE, BLOOD: Lipase: 31 U/L (ref 11–51)

## 2024-03-18 MED ORDER — GABAPENTIN 100 MG PO CAPS
200.0000 mg | ORAL_CAPSULE | Freq: Three times a day (TID) | ORAL | 0 refills | Status: DC
  Filled 2024-03-18: qty 42, 7d supply, fill #0

## 2024-03-18 MED ORDER — HYDROMORPHONE HCL 1 MG/ML IJ SOLN
0.5000 mg | Freq: Once | INTRAMUSCULAR | Status: AC
  Administered 2024-03-18: 0.5 mg via INTRAVENOUS
  Filled 2024-03-18: qty 1

## 2024-03-18 MED ORDER — LACTATED RINGERS IV BOLUS
1000.0000 mL | Freq: Once | INTRAVENOUS | Status: AC
  Administered 2024-03-18: 1000 mL via INTRAVENOUS

## 2024-03-18 MED ORDER — IOHEXOL 300 MG/ML  SOLN
100.0000 mL | Freq: Once | INTRAMUSCULAR | Status: AC | PRN
  Administered 2024-03-18: 100 mL via INTRAVENOUS

## 2024-03-18 MED ORDER — SODIUM CHLORIDE 0.9 % IV SOLN
1.0000 g | Freq: Once | INTRAVENOUS | Status: AC
  Administered 2024-03-18: 1 g via INTRAVENOUS
  Filled 2024-03-18: qty 10

## 2024-03-18 MED ORDER — MORPHINE SULFATE (PF) 4 MG/ML IV SOLN
4.0000 mg | Freq: Once | INTRAVENOUS | Status: AC
  Administered 2024-03-18: 4 mg via INTRAVENOUS
  Filled 2024-03-18: qty 1

## 2024-03-18 NOTE — ED Notes (Signed)
Pt urine sent to lab.

## 2024-03-18 NOTE — ED Triage Notes (Signed)
 Patient presents due to left lower quadrant abdominal pain for about 1 day. She had abdominal hernia surgery 12 days ago. EMS report the site is swollen, but the incision looks normal. EMS administered 100 mcg of fentanyl  and 500 ml of fluid.      EMS vitals 110/70 BP 80 HR 99% SPO2 on room air 20 RR 107 CBG

## 2024-03-18 NOTE — ED Notes (Signed)
 Patient taken to CT.

## 2024-03-18 NOTE — ED Provider Notes (Signed)
 [facility] AT [facility] Provider Note   CSN: [id number] Arrival date & time: 03/18/24  1844     Patient presents with: Abdominal Pain   Jillian Norton is a 83 y.o. female.  {Add pertinent medical, surgical, social history, OB history to HPI:32947} HPI      83 year old female with a history of open left femoral hernia repair with mesh, small bowel resection, diagnostic laparoscopy, complicated by UTI and AKI, history of DVT, dyslipidemia, breast cancer, thyroid  disease, who presents with concern for worsening pain and swelling around her left inguinal hernia site.   Continuing pain since the surgery, moved around more to the back of the incision, left side, sometimes bump will start to burn and hurt, bulge since the surgery is becoming more tender, waxes and wanes No appetite, thinks lost 10lb. Trying. Drinking ensure, extremely thirsty, uncomfortable.   Spoke with surgeon today and she ordered CT scan, had it set up for Friday but then pain significantly worsened and if pain worsens get to hospital No nausea, no vomiting Had soft bowel movement today or yesterday   Past Medical History:  Diagnosis Date   Anxiety    Breast cancer (HCC) 06/27/2012   Right   Cataract    DVT (deep venous thrombosis) (HCC) 06/28/2015   Dyslipidemia    GERD (gastroesophageal reflux disease)    Insomnia    Osteopenia    Overactive bladder    Personal history of radiation therapy    Shingles 06/2016   Thyroid  disease      Prior to Admission medications   Medication Sig Start Date End Date Taking? Authorizing Provider  acetaminophen  (TYLENOL ) 500 MG tablet Take 2 tablets (1,000 mg total) by mouth every 6 (six) hours as needed. 03/10/24   Augustus Almarie RAMAN, PA-C  albuterol  (VENTOLIN  HFA) 108 (90 Base) MCG/ACT inhaler Use 2 puffs 2x a day prn Inhalation as directed 30 days 08/31/21     buPROPion  (WELLBUTRIN  XL) 300 MG 24 hr tablet Take 1 tablet (300 mg total) by  mouth in the morning. 07/23/23     busPIRone  (BUSPAR ) 5 MG tablet Take 1 tablet (5 mg total) by mouth daily. 05/14/22     diclofenac Sodium (VOLTAREN) 1 % GEL Apply 1 Application topically daily as needed (pain).    [provider]  EPINEPHrine  (EPIPEN  2-PAK) 0.3 mg/0.3 mL IJ SOAJ injection as directed Injection daily 1 days Patient taking differently: as needed for anaphylaxis. 04/28/21     gabapentin  (NEURONTIN ) 100 MG capsule Take 2 capsules (200 mg total) by mouth 3 (three) times daily for 7 days. 03/18/24 03/25/24  Ann Fine, MD  methocarbamol  (ROBAXIN ) 500 MG tablet Take 1 tablet (500 mg total) by mouth every 8 (eight) hours as needed for muscle spasms. 03/10/24   Augustus Almarie RAMAN, PA-C  omeprazole  (PRILOSEC) 40 MG capsule Take 1 capsule (40 mg total) by mouth daily. 10/04/23     oxyCODONE  (OXY IR/ROXICODONE ) 5 MG immediate release tablet Take 1 tablet (5 mg total) by mouth every 6 (six) hours as needed for severe pain (pain score 7-10) (not relieved by tylenol  or robaxin ). 03/10/24   Augustus Almarie RAMAN, PA-C  Semaglutide -Weight Management (WEGOVY ) 2.4 MG/0.75ML SOAJ Inject 2.4 mg into the skin once a week as directed 09/19/22     senna-docusate (SENOKOT-S) 8.6-50 MG tablet Take 1 tablet by mouth at bedtime. 03/10/24   Augustus Almarie RAMAN, PA-C  triamcinolone  cream (KENALOG ) 0.1 % Apply 1 application on the skin twice  a day Apply thin layer to affected areas twice a day x 1-3 weeks as needed. 03/25/23     valACYclovir  (VALTREX ) 1000 MG tablet Take 1 tablet (1,000 mg total) by mouth 2 (two) times daily for 4 days as needed for cold sores. 04/24/23     zolpidem  (AMBIEN ) 5 MG tablet Take 1-2 tablets (5-10 mg total) by mouth at bedtime as needed as directed 10/03/23       Allergies: Sporanos [itraconazole], Tamoxifen, Anastrozole , and Sulfa antibiotics    Review of Systems  Updated Vital Signs BP 122/67   Pulse 72   Temp 98.2 F (36.8 C) (Oral)   Resp 15   SpO2 99%   Physical  Exam  (all labs ordered are listed, but only abnormal results are displayed) Labs Reviewed - No data to display  EKG: None  Radiology: No results found.  {Document cardiac monitor, telemetry assessment procedure when appropriate:32947} Procedures   Medications Ordered in the ED - No data to display    {Click here for ABCD2, HEART and other calculators REFRESH Note before signing:1}                              Medical Decision Making Amount and/or Complexity of Data Reviewed Labs: ordered. Radiology: ordered.  Risk Prescription drug management.   ***  {Document critical care time when appropriate  Document review of labs and clinical decision tools ie CHADS2VASC2, etc  Document your independent review of radiology images and any outside records  Document your discussion with family members, caretakers and with consultants  Document social determinants of health affecting pt's care  Document your decision making why or why not admission, treatments were needed:32947:::1}   Final diagnoses:  None    ED Discharge Orders     None

## 2024-03-19 ENCOUNTER — Encounter (HOSPITAL_COMMUNITY)
Admission: EM | Disposition: A | Discharge status: Skilled Nursing Facility | Payer: Self-pay | Source: Skilled Nursing Facility

## 2024-03-19 ENCOUNTER — Encounter (HOSPITAL_COMMUNITY): Payer: Self-pay

## 2024-03-19 ENCOUNTER — Inpatient Hospital Stay (HOSPITAL_COMMUNITY)

## 2024-03-19 ENCOUNTER — Inpatient Hospital Stay (HOSPITAL_COMMUNITY): Admitting: Certified Registered Nurse Anesthetist

## 2024-03-19 ENCOUNTER — Other Ambulatory Visit: Payer: Self-pay

## 2024-03-19 DIAGNOSIS — Z91030 Bee allergy status: Secondary | ICD-10-CM | POA: Diagnosis not present

## 2024-03-19 DIAGNOSIS — K4090 Unilateral inguinal hernia, without obstruction or gangrene, not specified as recurrent: Secondary | ICD-10-CM | POA: Diagnosis not present

## 2024-03-19 DIAGNOSIS — Z9049 Acquired absence of other specified parts of digestive tract: Secondary | ICD-10-CM | POA: Diagnosis not present

## 2024-03-19 DIAGNOSIS — S3729XA Other injury of bladder, initial encounter: Secondary | ICD-10-CM | POA: Diagnosis present

## 2024-03-19 DIAGNOSIS — E785 Hyperlipidemia, unspecified: Secondary | ICD-10-CM | POA: Diagnosis present

## 2024-03-19 DIAGNOSIS — S3720XA Unspecified injury of bladder, initial encounter: Secondary | ICD-10-CM

## 2024-03-19 DIAGNOSIS — Z90710 Acquired absence of both cervix and uterus: Secondary | ICD-10-CM | POA: Diagnosis not present

## 2024-03-19 DIAGNOSIS — R1032 Left lower quadrant pain: Secondary | ICD-10-CM | POA: Diagnosis not present

## 2024-03-19 DIAGNOSIS — Y838 Other surgical procedures as the cause of abnormal reaction of the patient, or of later complication, without mention of misadventure at the time of the procedure: Secondary | ICD-10-CM | POA: Diagnosis present

## 2024-03-19 DIAGNOSIS — N9989 Other postprocedural complications and disorders of genitourinary system: Secondary | ICD-10-CM | POA: Diagnosis present

## 2024-03-19 DIAGNOSIS — F17210 Nicotine dependence, cigarettes, uncomplicated: Secondary | ICD-10-CM | POA: Diagnosis present

## 2024-03-19 DIAGNOSIS — R188 Other ascites: Secondary | ICD-10-CM | POA: Diagnosis not present

## 2024-03-19 DIAGNOSIS — Z800 Family history of malignant neoplasm of digestive organs: Secondary | ICD-10-CM | POA: Diagnosis not present

## 2024-03-19 DIAGNOSIS — F419 Anxiety disorder, unspecified: Secondary | ICD-10-CM | POA: Diagnosis present

## 2024-03-19 DIAGNOSIS — Z882 Allergy status to sulfonamides status: Secondary | ICD-10-CM | POA: Diagnosis not present

## 2024-03-19 DIAGNOSIS — Z79899 Other long term (current) drug therapy: Secondary | ICD-10-CM | POA: Diagnosis not present

## 2024-03-19 DIAGNOSIS — K219 Gastro-esophageal reflux disease without esophagitis: Secondary | ICD-10-CM | POA: Diagnosis present

## 2024-03-19 DIAGNOSIS — G8918 Other acute postprocedural pain: Secondary | ICD-10-CM | POA: Diagnosis present

## 2024-03-19 DIAGNOSIS — Z7985 Long-term (current) use of injectable non-insulin antidiabetic drugs: Secondary | ICD-10-CM | POA: Diagnosis not present

## 2024-03-19 DIAGNOSIS — Z853 Personal history of malignant neoplasm of breast: Secondary | ICD-10-CM | POA: Diagnosis not present

## 2024-03-19 DIAGNOSIS — Z923 Personal history of irradiation: Secondary | ICD-10-CM | POA: Diagnosis not present

## 2024-03-19 DIAGNOSIS — Z86718 Personal history of other venous thrombosis and embolism: Secondary | ICD-10-CM | POA: Diagnosis not present

## 2024-03-19 DIAGNOSIS — G4700 Insomnia, unspecified: Secondary | ICD-10-CM | POA: Diagnosis present

## 2024-03-19 DIAGNOSIS — Z801 Family history of malignant neoplasm of trachea, bronchus and lung: Secondary | ICD-10-CM | POA: Diagnosis not present

## 2024-03-19 DIAGNOSIS — Z888 Allergy status to other drugs, medicaments and biological substances status: Secondary | ICD-10-CM | POA: Diagnosis not present

## 2024-03-19 HISTORY — PX: BLADDER REPAIR: SHX6721

## 2024-03-19 HISTORY — PX: LAPAROTOMY: SHX154

## 2024-03-19 LAB — BASIC METABOLIC PANEL WITH GFR
Anion gap: 9 (ref 5–15)
BUN: 12 mg/dL (ref 8–23)
CO2: 24 mmol/L (ref 22–32)
Calcium: 8.7 mg/dL — ABNORMAL LOW (ref 8.9–10.3)
Chloride: 101 mmol/L (ref 98–111)
Creatinine, Ser: 0.68 mg/dL (ref 0.44–1.00)
GFR, Estimated: >60 mL/min (ref >60)
Glucose, Bld: 97 mg/dL (ref 70–99)
Potassium: 3.7 mmol/L (ref 3.5–5.1)
Sodium: 134 mmol/L — ABNORMAL LOW (ref 135–145)

## 2024-03-19 LAB — CBC WITH DIFFERENTIAL/PLATELET
Abs Immature Granulocytes: 0.02 K/uL (ref 0.00–0.07)
Basophils Absolute: 0 K/uL (ref 0.0–0.1)
Basophils Relative: 1 %
Eosinophils Absolute: 0.1 K/uL (ref 0.0–0.5)
Eosinophils Relative: 2 %
HCT: 32.2 % — ABNORMAL LOW (ref 36.0–46.0)
Hemoglobin: 10.2 g/dL — ABNORMAL LOW (ref 12.0–15.0)
Immature Granulocytes: 0 %
Lymphocytes Relative: 24 %
Lymphs Abs: 1.5 K/uL (ref 0.7–4.0)
MCH: 29.2 pg (ref 26.0–34.0)
MCHC: 31.7 g/dL (ref 30.0–36.0)
MCV: 92.3 fL (ref 80.0–100.0)
Monocytes Absolute: 0.5 K/uL (ref 0.1–1.0)
Monocytes Relative: 9 %
Neutro Abs: 3.9 K/uL (ref 1.7–7.7)
Neutrophils Relative %: 65 %
Platelets: 474 K/uL — ABNORMAL HIGH (ref 150–400)
RBC: 3.49 MIL/uL — ABNORMAL LOW (ref 3.87–5.11)
RDW: 13.2 % (ref 11.5–15.5)
WBC: 6 K/uL (ref 4.0–10.5)
nRBC: 0 % (ref 0.0–0.2)

## 2024-03-19 LAB — CBC
HCT: 31.7 % — ABNORMAL LOW (ref 36.0–46.0)
Hemoglobin: 10.1 g/dL — ABNORMAL LOW (ref 12.0–15.0)
MCH: 29.4 pg (ref 26.0–34.0)
MCHC: 31.9 g/dL (ref 30.0–36.0)
MCV: 92.2 fL (ref 80.0–100.0)
Platelets: 484 K/uL — ABNORMAL HIGH (ref 150–400)
RBC: 3.44 MIL/uL — ABNORMAL LOW (ref 3.87–5.11)
RDW: 13.3 % (ref 11.5–15.5)
WBC: 5.2 K/uL (ref 4.0–10.5)
nRBC: 0 % (ref 0.0–0.2)

## 2024-03-19 LAB — PREPARE RBC (CROSSMATCH)

## 2024-03-19 LAB — BLOOD CULTURE ID PANEL (REFLEXED) - BCID2

## 2024-03-19 LAB — I-STAT CG4 LACTIC ACID, ED: Lactic Acid, Venous: 0.7 mmol/L (ref 0.5–1.9)

## 2024-03-19 SURGERY — LAPAROTOMY, EXPLORATORY
Anesthesia: General | Site: Abdomen

## 2024-03-19 MED ORDER — SODIUM CHLORIDE 0.9 % IV SOLN: 2.0000 g | INTRAVENOUS | Status: DC

## 2024-03-19 MED ORDER — ACETAMINOPHEN 10 MG/ML IV SOLN
1000.0000 mg | Freq: Once | INTRAVENOUS | Status: DC | PRN
  Administered 2024-03-19: 1000 mg via INTRAVENOUS

## 2024-03-19 MED ORDER — ONDANSETRON 4 MG PO TBDP: 4.0000 mg | ORAL_TABLET | Freq: Four times a day (QID) | ORAL | Status: DC | PRN

## 2024-03-19 MED ORDER — CIPROFLOXACIN IN D5W 400 MG/200ML IV SOLN
400.0000 mg | Freq: Two times a day (BID) | INTRAVENOUS | Status: DC
  Administered 2024-03-19 – 2024-03-21 (×5): 400 mg via INTRAVENOUS
  Filled 2024-03-19 (×5): qty 200

## 2024-03-19 MED ORDER — PROPOFOL 10 MG/ML IV BOLUS
INTRAVENOUS | Status: AC
  Filled 2024-03-19: qty 20

## 2024-03-19 MED ORDER — ZOLPIDEM TARTRATE 5 MG PO TABS
5.0000 mg | ORAL_TABLET | Freq: Every evening | ORAL | Status: DC | PRN
  Administered 2024-03-19 – 2024-03-23 (×4): 5 mg via ORAL
  Filled 2024-03-19 (×4): qty 1

## 2024-03-19 MED ORDER — HYDROMORPHONE HCL 1 MG/ML IJ SOLN
INTRAMUSCULAR | Status: AC
  Filled 2024-03-19: qty 1

## 2024-03-19 MED ORDER — FENTANYL CITRATE (PF) 100 MCG/2ML IJ SOLN
INTRAMUSCULAR | Status: DC | PRN
  Administered 2024-03-19: 50 ug via INTRAVENOUS
  Administered 2024-03-19: 100 ug via INTRAVENOUS
  Administered 2024-03-19: 50 ug via INTRAVENOUS

## 2024-03-19 MED ORDER — STERILE WATER FOR IRRIGATION IR SOLN
Status: DC | PRN
  Administered 2024-03-19: 1000 mL

## 2024-03-19 MED ORDER — HYDROMORPHONE HCL 1 MG/ML IJ SOLN
0.2500 mg | INTRAMUSCULAR | Status: DC | PRN
  Administered 2024-03-19 (×2): 0.5 mg via INTRAVENOUS

## 2024-03-19 MED ORDER — ONDANSETRON HCL 4 MG/2ML IJ SOLN: 4.0000 mg | Freq: Four times a day (QID) | INTRAMUSCULAR | Status: DC | PRN

## 2024-03-19 MED ORDER — CHLORHEXIDINE GLUCONATE CLOTH 2 % EX PADS
6.0000 | MEDICATED_PAD | Freq: Every day | CUTANEOUS | Status: DC
  Administered 2024-03-19 – 2024-03-24 (×6): 6 via TOPICAL

## 2024-03-19 MED ORDER — OXYCODONE HCL 5 MG/5ML PO SOLN: 5.0000 mg | Freq: Once | ORAL | Status: DC | PRN

## 2024-03-19 MED ORDER — ACETAMINOPHEN 10 MG/ML IV SOLN
INTRAVENOUS | Status: AC
Start: 2024-03-19 — End: 2024-03-19
  Filled 2024-03-19: qty 100

## 2024-03-19 MED ORDER — FENTANYL CITRATE (PF) 100 MCG/2ML IJ SOLN
INTRAMUSCULAR | Status: AC
  Filled 2024-03-19: qty 2

## 2024-03-19 MED ORDER — ONDANSETRON HCL 4 MG/2ML IJ SOLN
INTRAMUSCULAR | Status: DC | PRN
  Administered 2024-03-19: 4 mg via INTRAVENOUS

## 2024-03-19 MED ORDER — FENTANYL CITRATE PF 50 MCG/ML IJ SOSY
PREFILLED_SYRINGE | INTRAMUSCULAR | Status: AC
  Filled 2024-03-19: qty 2

## 2024-03-19 MED ORDER — ENOXAPARIN SODIUM 40 MG/0.4ML IJ SOSY
40.0000 mg | PREFILLED_SYRINGE | INTRAMUSCULAR | Status: DC
  Administered 2024-03-19 – 2024-03-23 (×5): 40 mg via SUBCUTANEOUS
  Filled 2024-03-19 (×5): qty 0.4

## 2024-03-19 MED ORDER — SODIUM CHLORIDE 0.9 % IV SOLN: INTRAVENOUS | Status: AC

## 2024-03-19 MED ORDER — DEXAMETHASONE SODIUM PHOSPHATE 4 MG/ML IJ SOLN
INTRAMUSCULAR | Status: DC | PRN
  Administered 2024-03-19: 5 mg via INTRAVENOUS

## 2024-03-19 MED ORDER — DROPERIDOL 2.5 MG/ML IJ SOLN: 0.6250 mg | Freq: Once | INTRAMUSCULAR | Status: DC | PRN

## 2024-03-19 MED ORDER — ROCURONIUM BROMIDE 100 MG/10ML IV SOLN
INTRAVENOUS | Status: DC | PRN
  Administered 2024-03-19: 50 mg via INTRAVENOUS

## 2024-03-19 MED ORDER — DEXAMETHASONE SODIUM PHOSPHATE 10 MG/ML IJ SOLN
INTRAMUSCULAR | Status: AC
  Filled 2024-03-19: qty 1

## 2024-03-19 MED ORDER — MORPHINE SULFATE (PF) 2 MG/ML IV SOLN
2.0000 mg | INTRAVENOUS | Status: DC | PRN
  Administered 2024-03-19 – 2024-03-21 (×6): 2 mg via INTRAVENOUS
  Filled 2024-03-19 (×7): qty 1

## 2024-03-19 MED ORDER — 0.9 % SODIUM CHLORIDE (POUR BTL) OPTIME
TOPICAL | Status: DC | PRN
Start: 2024-03-19 — End: 2024-03-19
  Administered 2024-03-19: 2000 mL

## 2024-03-19 MED ORDER — LIDOCAINE HCL (CARDIAC) PF 100 MG/5ML IV SOSY
PREFILLED_SYRINGE | INTRAVENOUS | Status: DC | PRN
Start: 2024-03-19 — End: 2024-03-20
  Administered 2024-03-19: 80 mg via INTRAVENOUS

## 2024-03-19 MED ORDER — SIMETHICONE 80 MG PO CHEW
40.0000 mg | CHEWABLE_TABLET | Freq: Four times a day (QID) | ORAL | Status: DC | PRN
Start: 2024-03-19 — End: 2024-03-24
  Administered 2024-03-21 (×3): 40 mg via ORAL
  Filled 2024-03-19 (×3): qty 1

## 2024-03-19 MED ORDER — OXYCODONE HCL 5 MG PO TABS
5.0000 mg | ORAL_TABLET | ORAL | Status: DC | PRN
  Administered 2024-03-19 – 2024-03-20 (×3): 10 mg via ORAL
  Administered 2024-03-20: 5 mg via ORAL
  Administered 2024-03-20: 10 mg via ORAL
  Administered 2024-03-21 (×2): 5 mg via ORAL
  Administered 2024-03-21 – 2024-03-23 (×2): 10 mg via ORAL
  Filled 2024-03-19: qty 2
  Filled 2024-03-19: qty 1
  Filled 2024-03-19 (×2): qty 2
  Filled 2024-03-19: qty 1
  Filled 2024-03-19 (×3): qty 2
  Filled 2024-03-19: qty 1
  Filled 2024-03-19: qty 2

## 2024-03-19 MED ORDER — SODIUM CHLORIDE 0.9% IV SOLUTION: Freq: Once | INTRAVENOUS | Status: DC

## 2024-03-19 MED ORDER — LACTATED RINGERS IV SOLN
INTRAVENOUS | Status: DC | PRN
Start: 2024-03-19 — End: 2024-03-20

## 2024-03-19 MED ORDER — IOHEXOL 300 MG/ML  SOLN
100.0000 mL | Freq: Once | INTRAMUSCULAR | Status: AC | PRN
  Administered 2024-03-19: 100 mL

## 2024-03-19 MED ORDER — DEXAMETHASONE SODIUM PHOSPHATE 10 MG/ML IJ SOLN
INTRAMUSCULAR | Status: DC | PRN
Start: 2024-03-19 — End: 2024-03-20
  Administered 2024-03-19: 5 mg via INTRAVENOUS

## 2024-03-19 MED ORDER — FENTANYL CITRATE PF 50 MCG/ML IJ SOSY
PREFILLED_SYRINGE | INTRAMUSCULAR | Status: AC
  Filled 2024-03-19: qty 1

## 2024-03-19 MED ORDER — OXYCODONE HCL 5 MG PO TABS: 5.0000 mg | ORAL_TABLET | Freq: Once | ORAL | Status: DC | PRN

## 2024-03-19 MED ORDER — FENTANYL CITRATE PF 50 MCG/ML IJ SOSY
25.0000 ug | PREFILLED_SYRINGE | INTRAMUSCULAR | Status: DC | PRN
  Administered 2024-03-19 (×3): 50 ug via INTRAVENOUS

## 2024-03-19 MED ORDER — PROPOFOL 10 MG/ML IV BOLUS
INTRAVENOUS | Status: DC | PRN
Start: 2024-03-19 — End: 2024-03-20
  Administered 2024-03-19: 130 mg via INTRAVENOUS

## 2024-03-19 MED ORDER — ACETAMINOPHEN 650 MG RE SUPP: 650.0000 mg | Freq: Four times a day (QID) | RECTAL | Status: DC | PRN

## 2024-03-19 MED ORDER — ACETAMINOPHEN 325 MG PO TABS
650.0000 mg | ORAL_TABLET | Freq: Four times a day (QID) | ORAL | Status: DC | PRN
Start: 2024-03-19 — End: 2024-03-20

## 2024-03-19 MED ORDER — OXYBUTYNIN CHLORIDE 5 MG PO TABS
5.0000 mg | ORAL_TABLET | Freq: Three times a day (TID) | ORAL | Status: DC | PRN
  Administered 2024-03-19 – 2024-03-21 (×4): 5 mg via ORAL
  Filled 2024-03-19 (×4): qty 1

## 2024-03-19 MED ORDER — ONDANSETRON HCL 4 MG/2ML IJ SOLN
INTRAMUSCULAR | Status: AC
  Filled 2024-03-19: qty 2

## 2024-03-19 SURGICAL SUPPLY — 42 items
BAG COUNTER SPONGE SURGICOUNT (BAG) IMPLANT
CHLORAPREP W/TINT 26 (MISCELLANEOUS) ×2 IMPLANT
COVER MAYO STAND STRL (DRAPES) ×2 IMPLANT
COVER SURGICAL LIGHT HANDLE (MISCELLANEOUS) ×2 IMPLANT
DERMABOND ADVANCED .7 DNX12 (GAUZE/BANDAGES/DRESSINGS) IMPLANT
DRAIN CHANNEL 15F RND FF 3/16 (WOUND CARE) IMPLANT
DRAPE LAPAROSCOPIC ABDOMINAL (DRAPES) ×2 IMPLANT
DRAPE UTILITY XL STRL (DRAPES) ×2 IMPLANT
DRAPE WARM FLUID 44X44 (DRAPES) ×2 IMPLANT
DRSG OPSITE POSTOP 4X10 (GAUZE/BANDAGES/DRESSINGS) IMPLANT
DRSG OPSITE POSTOP 4X6 (GAUZE/BANDAGES/DRESSINGS) IMPLANT
DRSG OPSITE POSTOP 4X8 (GAUZE/BANDAGES/DRESSINGS) IMPLANT
DRSG TEGADERM 4X4.75 (GAUZE/BANDAGES/DRESSINGS) IMPLANT
ELECT REM PT RETURN 15FT ADLT (MISCELLANEOUS) ×2 IMPLANT
EVACUATOR SILICONE 100CC (DRAIN) IMPLANT
GLOVE BIOGEL PI IND STRL 7.0 (GLOVE) ×2 IMPLANT
GLOVE SURG SS PI 7.0 STRL IVOR (GLOVE) ×2 IMPLANT
GOWN STRL REUS W/ TWL LRG LVL3 (GOWN DISPOSABLE) ×2 IMPLANT
HANDLE SUCTION POOLE (INSTRUMENTS) ×2 IMPLANT
KIT BASIN OR (CUSTOM PROCEDURE TRAY) ×2 IMPLANT
KIT TURNOVER KIT A (KITS) ×2 IMPLANT
PACK GENERAL/GYN (CUSTOM PROCEDURE TRAY) ×2 IMPLANT
RELOAD STAPLE 100 3.8 BLU REG (MISCELLANEOUS) IMPLANT
STAPLER PROXIMATE 100MM BLUE (MISCELLANEOUS) IMPLANT
STAPLER SKIN PROX 35W (STAPLE) ×2 IMPLANT
SUT ETHILON 2 0 PS N (SUTURE) IMPLANT
SUT MNCRL AB 4-0 PS2 18 (SUTURE) IMPLANT
SUT PDS AB 0 CT1 36 (SUTURE) IMPLANT
SUT SILK 2 0 SH CR/8 (SUTURE) ×2 IMPLANT
SUT SILK 2-0 18XBRD TIE 12 (SUTURE) ×2 IMPLANT
SUT SILK 3 0 SH CR/8 (SUTURE) ×2 IMPLANT
SUT SILK 3-0 18XBRD TIE 12 (SUTURE) ×2 IMPLANT
SUT VIC AB 2-0 CT1 TAPERPNT 27 (SUTURE) IMPLANT
SUT VIC AB 2-0 SH 18 (SUTURE) IMPLANT
SUT VIC AB 2-0 SH 27X BRD (SUTURE) IMPLANT
SUT VIC AB 3-0 SH 27XBRD (SUTURE) IMPLANT
SUT VIC AB 4-0 SH 27XBRD (SUTURE) IMPLANT
SYRINGE TOOMEY IRRIG 70ML (MISCELLANEOUS) IMPLANT
TOWEL OR 17X26 10 PK STRL BLUE (TOWEL DISPOSABLE) ×2 IMPLANT
TRAY FOLEY MTR SLVR 14FR STAT (SET/KITS/TRAYS/PACK) ×2 IMPLANT
TRAY FOLEY MTR SLVR 16FR STAT (SET/KITS/TRAYS/PACK) ×2 IMPLANT
YANKAUER SUCT BULB TIP NO VENT (SUCTIONS) ×2 IMPLANT

## 2024-03-19 NOTE — Op Note (Signed)
 Operative Note  Preoperative diagnosis:  1.  Bladder rupture  Postoperative diagnosis: 1.  Extraperitoneal bladder rupture  Procedure(s): 1.  Complex cystorrhaphy   Surgeon: Donnice Siad, MD  Assistants:  Ronal Leeroy Shank, MD Herlene Bureau, MD  Anesthesia:  General  Complications:  None  EBL:  Minimal  Specimens: None  Drains/Catheters: 1.  16 French foley catheter  Intraoperative findings:   Approximately 2x2cm bladder dome injury successfully repaired in 2 layers with no leak after closure.   Indication:  Jillian Norton is a 83 y.o. female with who underwent left femoral hernia repair with small bowel resection on 03/13/2024 for an incarcerated hernia.  Following surgery, she had suprapubic pain, pressure, urinary retention.  She had CT scan 03/05/2024 and on my read with Foley catheter balloon outside of the bladder wall at the dome.  She had been experiencing intermittent pain over the past couple weeks with difficulty voiding.  She presented again yesterday with complaints of pain.  CT scan demonstrated anterior extraperitoneal bladder rupture with large fluid collection overlying the suprapubic fat pad.  CT cystogram confirmed extraperitoneal bladder rupture.  She is being brought to the operating today for cystorrhaphy.  Description of procedure: The indications, alternatives, benefits and risks were discussed with the patient and informed consent was obtained. The patient was brought to the operating room table, positioned supine and secured with a safety strap.  All pressure points were carefully padded and pneumatic compression devices were placed on the lower extremities. After the administration of intravenous antibiotics and general anesthesia, the abdomen and genitalia were prepped and draped in the standard sterile manner. A time was completed, verifying the correct patient, surgical procedure and positioning, prior to beginning the procedure. Patient had  indwelling 16 French urethral catheter.  Her previous oblique left lower quadrant incision was opened and and the subcutaneous tissues were incised with Bovie cautery.  Immediately following this, she had drainage of clear yellow urine.  The tissues were quite inflamed and was apparent that her mesh was adjacent to the dome of her bladder.  The previously closed external oblique fascia was already open. After further dissection, was able to palpate the bladder injury that appeared to be a 2 x 2 cm area of the dome of her bladder.  Again, the surrounding tissues were quite inflamed and it was difficult to dissect around this bladder to obtain a clean margin.  Stay sutures were placed using 3-0 Vicryl sutures at each apex of the injury.  Following this, the bladder injury was closed in 2 separate layers using 3-0 and 2-0 Vicryl sutures on the mucosal and muscularis/adventitial layers, respectively. Meticulous hemostasis was achieved using electrocautery.  The patency of the repair was confirmed by irrigating the urethral catheter with 300 mL sterile normal saline and there was no evidence of leak.  The catheter was connected to a drainage bag.  A JP drain was then placed in the space of Retzius away from the cystotomy and brought out through a separate stab incision, wire was secured to the skin with a prolene suture.  Prior to closure, the operative field was inspected and found to be intact without evidence of bleeding or injury. I then closed the external oblique layer with 2-0 Vicryl running sutures.  The anterior fascia was closed using 2-0 PDS sutures.  Scarpa's fascia was closed using 2-0 Vicryl sutures.  The skin was closed with a running subcuticular 4 Monocryl suture and dressed with Dermabond.  At the end the  procedure, all counts were correct. The patient tolerated the procedure well and was taken to the recovery area in stable condition.  Plan: Check JP creatinine tomorrow.  Leave Foley catheter in  place for 14 days.  Will plan to check cystogram prior to Foley catheter removal.  Operative findings discussed with patient's daughter.  Matt R. Tyray Proch MD [facility]  Pager: [phone]

## 2024-03-19 NOTE — Progress Notes (Signed)
 Pre Procedure note for inpatients:   Jillian Norton has been scheduled for Procedure(s) with comments: LAPAROTOMY, EXPLORATORY and bladder repair - Dr. Selma or Dr. Elisabeth will assist today. The various methods of treatment have been discussed with the patient. After consideration of the risks, benefits and treatment options the patient has consented to the planned procedure.   The patient has been seen and labs reviewed. There are no changes in the patient's condition to prevent proceeding with the planned procedure today.  Recent labs:  Lab Results  Component Value Date   WBC 5.2 03/19/2024   HGB 10.1 (L) 03/19/2024   HCT 31.7 (L) 03/19/2024   PLT 484 (H) 03/19/2024   GLUCOSE 97 03/19/2024   CHOL 185 10/24/2015   TRIG 61 10/24/2015   HDL 67 10/24/2015   LDLCALC 126 10/24/2015   ALT 17 03/18/2024   AST 12 (L) 03/18/2024   NA 134 (L) 03/19/2024   K 3.7 03/19/2024   CL 101 03/19/2024   CREATININE 0.68 03/19/2024   BUN 12 03/19/2024   CO2 24 03/19/2024   TSH 2.56 05/27/2017    [name], MD 03/19/2024 2:38 PM

## 2024-03-19 NOTE — ED Notes (Signed)
 Blood is being held currently. Lab is processing a CBC to confirm recent level. This writer released unit of blood before lab was able to get in touch of this writer to confirm the hemoglobin level. Consulting MD was made aware of this and the plan to make sure level is accurate before administering the unit of blood.

## 2024-03-19 NOTE — ED Notes (Signed)
 Patient is going to CT scan then to the inpatient floor assignment. RN made aware upstairs of plan.

## 2024-03-19 NOTE — H&P (Signed)
 Jillian Norton is an 83 y.o. female.   Chief Complaint: left groin swelling HPI: 65 yof who had left femoral hernia repair with vicryl on 7/8 with sbr for sbo.  Postop she had issues with urinary retention and was seen by urology. She had a postop ct scan two days after surgery with foley in place that showed mostly expected postop findings with a small fluid collection in left groin.  She had some AKI that eventually resolved. She had foley out eventually for retention and urology saw in hospital.  She had a successful voiding trial. She feels at one point postop she had acute pain (after first ct) and then noted the left groin swelling that has been progressive since then. Was discharged having bowel movements. Not much appetite. She has been having issues with voiding for about a month and this includes preop She was voiding at home. Seen in our office 7/18 with left groin swelling.  Due to pain came to ER today. She has a ct scan concerning for bladder rupture anteriorly into the left groin now.  I have discussed with urology. She now has a foley draining about 300 cc of clear yellow urine.  Her hb was decreased significantly on lab draw. I am not sure of the source. Being rechecked to confirm.   Past Medical History:  Diagnosis Date   Anxiety    Breast cancer (HCC) 06/27/2012   Right   Cataract    DVT (deep venous thrombosis) (HCC) 06/28/2015   Dyslipidemia    GERD (gastroesophageal reflux disease)    Insomnia    Osteopenia    Overactive bladder    Personal history of radiation therapy    Shingles 06/2016   Thyroid  disease     Past Surgical History:  Procedure Laterality Date   BOWEL RESECTION N/A 03/03/2024   Procedure: EXCISION, SMALL INTESTINE;  Surgeon: Ann Fine, MD;  Location: [facility] OR;  Service: General;  Laterality: N/A;   BREAST LUMPECTOMY Right 2014   BREAST LUMPECTOMY  2013   right   CATARACT EXTRACTION W/ INTRAOCULAR LENS IMPLANT Bilateral    COLONOSCOPY   08/27/2012   [place]    INGUINAL HERNIA REPAIR N/A 03/03/2024   Procedure: REPAIR, HERNIA, INGUINAL/FEMORAL, ADULT;  Surgeon: Ann Fine, MD;  Location: [facility] OR;  Service: General;  Laterality: N/A;  LEFT INGUINAL HERNIA  REPAIR POSSIBLE LAPAROTOMY WITH SMALL BOWEL RESECTION   INSERTION OF MESH Left 03/03/2024   Procedure: INSERTION OF VICRYL MESH, LEFT GROIN;  Surgeon: Ann Fine, MD;  Location: [facility] OR;  Service: General;  Laterality: Left;   KNEE ARTHROSCOPY     left   LAPAROSCOPIC ABDOMINAL EXPLORATION N/A 03/03/2024   Procedure: EXPLORATION, ABDOMEN, LAPAROSCOPIC;  Surgeon: Ann Fine, MD;  Location: [facility] OR;  Service: General;  Laterality: N/A;   LASIK Bilateral    REDUCTION MAMMAPLASTY Bilateral 1985   anchor scar    VAGINAL HYSTERECTOMY  2002    Family History  Problem Relation Age of Onset   Lung cancer Mother    Colon cancer Father    Social History:  reports that she has quit smoking. Her smoking use included cigarettes. She has a 10 pack-year smoking history. She has never used smokeless tobacco. She reports current alcohol  use. She reports that she does not use drugs.  Allergies:  Allergies  Allergen Reactions   Sporanos [Itraconazole] Anaphylaxis   Tamoxifen Anaphylaxis    Gave her a blood clot   Anastrozole  Other (See Comments)    Unknown,  pt was unaware of this allergy    Sulfa Antibiotics Nausea And Vomiting    Current Facility-Administered Medications  Medication Dose Route Frequency Provider Last Rate Last Admin   0.9 %  sodium chloride  infusion (Manually program via Guardrails IV Fluids)   Intravenous Once Schlossman, Erin, MD   Held at 03/19/24 0135   0.9 %  sodium chloride  infusion   Intravenous Continuous Ebbie Cough, MD       acetaminophen  (TYLENOL ) tablet 650 mg  650 mg Oral Q6H PRN Ebbie Cough, MD       Or   acetaminophen  (TYLENOL ) suppository 650 mg  650 mg Rectal Q6H PRN Ebbie Cough, MD       Chlorhexidine  Gluconate Cloth 2  % PADS 6 each  6 each Topical Daily Pace, Maryellen D, MD       ciprofloxacin  (CIPRO ) IVPB 400 mg  400 mg Intravenous Q12H Ebbie Cough, MD       enoxaparin  (LOVENOX ) injection 40 mg  40 mg Subcutaneous Q24H Ebbie Cough, MD       morphine  (PF) 2 MG/ML injection 2 mg  2 mg Intravenous Q2H PRN Ebbie Cough, MD       ondansetron  (ZOFRAN -ODT) disintegrating tablet 4 mg  4 mg Oral Q6H PRN Ebbie Cough, MD       Or   ondansetron  (ZOFRAN ) injection 4 mg  4 mg Intravenous Q6H PRN Ebbie Cough, MD       oxyCODONE  (Oxy IR/ROXICODONE ) immediate release tablet 5-10 mg  5-10 mg Oral Q4H PRN Ebbie Cough, MD       simethicone  (MYLICON) chewable tablet 40 mg  40 mg Oral Q6H PRN Ebbie Cough, MD       Current Outpatient Medications  Medication Sig Dispense Refill   acetaminophen  (TYLENOL ) 500 MG tablet Take 2 tablets (1,000 mg total) by mouth every 6 (six) hours as needed.     albuterol  (VENTOLIN  HFA) 108 (90 Base) MCG/ACT inhaler Use 2 puffs 2x a day prn Inhalation as directed 30 days 18 g 0   buPROPion  (WELLBUTRIN  XL) 300 MG 24 hr tablet Take 1 tablet (300 mg total) by mouth in the morning. 90 tablet 2   busPIRone  (BUSPAR ) 5 MG tablet Take 1 tablet (5 mg total) by mouth daily. 30 tablet 5   diclofenac Sodium (VOLTAREN) 1 % GEL Apply 1 Application topically daily as needed (pain).     EPINEPHrine  (EPIPEN  2-PAK) 0.3 mg/0.3 mL IJ SOAJ injection as directed Injection daily 1 days (Patient taking differently: as needed for anaphylaxis.) 2 each 0   gabapentin  (NEURONTIN ) 100 MG capsule Take 2 capsules (200 mg total) by mouth 3 (three) times daily for 7 days. 42 capsule 0   methocarbamol  (ROBAXIN ) 500 MG tablet Take 1 tablet (500 mg total) by mouth every 8 (eight) hours as needed for muscle spasms. 30 tablet 0   omeprazole  (PRILOSEC) 40 MG capsule Take 1 capsule (40 mg total) by mouth daily. 90 capsule 3   oxyCODONE  (OXY IR/ROXICODONE ) 5 MG immediate release tablet Take 1  tablet (5 mg total) by mouth every 6 (six) hours as needed for severe pain (pain score 7-10) (not relieved by tylenol  or robaxin ). 10 tablet 0   Semaglutide -Weight Management (WEGOVY ) 2.4 MG/0.75ML SOAJ Inject 2.4 mg into the skin once a week as directed 9 mL 3   senna-docusate (SENOKOT-S) 8.6-50 MG tablet Take 1 tablet by mouth at bedtime. 14 tablet 0   triamcinolone  cream (KENALOG ) 0.1 % Apply 1 application on the skin  twice a day Apply thin layer to affected areas twice a day x 1-3 weeks as needed. 80 g 0   valACYclovir  (VALTREX ) 1000 MG tablet Take 1 tablet (1,000 mg total) by mouth 2 (two) times daily for 4 days as needed for cold sores. 30 tablet 0   zolpidem  (AMBIEN ) 5 MG tablet Take 1-2 tablets (5-10 mg total) by mouth at bedtime as needed as directed 45 tablet 1      Results for orders placed or performed during the hospital encounter of 03/18/24 (from the past 48 hours)  Urinalysis, w/ Reflex to Culture (Infection Suspected) -Urine, Clean Catch     Status: Abnormal   Collection Time: 03/18/24  9:50 PM  Result Value Ref Range   Specimen Source URINE, CLEAN CATCH    Color, Urine YELLOW YELLOW   APPearance CLOUDY (A) CLEAR   Specific Gravity, Urine 1.009 1.005 - 1.030   pH 6.0 5.0 - 8.0   Glucose, UA NEGATIVE NEGATIVE mg/dL   Hgb urine dipstick MODERATE (A) NEGATIVE   Bilirubin Urine NEGATIVE NEGATIVE   Ketones, ur NEGATIVE NEGATIVE mg/dL   Protein, ur NEGATIVE NEGATIVE mg/dL   Nitrite NEGATIVE NEGATIVE   Leukocytes,Ua LARGE (A) NEGATIVE   RBC / HPF 11-20 0 - 5 RBC/hpf   WBC, UA >50 0 - 5 WBC/hpf    Comment:        Reflex urine culture not performed if WBC <=10, OR if Squamous epithelial cells >5. If Squamous epithelial cells >5 suggest recollection.    Bacteria, UA MANY (A) NONE SEEN   Squamous Epithelial / HPF 0-5 0 - 5 /HPF   WBC Clumps PRESENT    Mucus PRESENT    Hyaline Casts, UA PRESENT    Non Squamous Epithelial 0-5 (A) NONE SEEN    Comment: Performed at [facility], [address] [place], [place] [address]  CBC with Differential     Status: Abnormal   Collection Time: 03/18/24 10:10 PM  Result Value Ref Range   WBC 7.7 4.0 - 10.5 K/uL   RBC 2.26 (L) 3.87 - 5.11 MIL/uL   Hemoglobin 6.8 (LL) 12.0 - 15.0 g/dL    Comment: REPEATED TO VERIFY THIS CRITICAL RESULT HAS VERIFIED AND BEEN CALLED TO B. OLBLAND, RN BY KATIE DAVIS ON 07 23 2025 AT 2311, AND HAS BEEN READ BACK.     HCT 20.9 (L) 36.0 - 46.0 %   MCV 92.5 80.0 - 100.0 fL   MCH 30.1 26.0 - 34.0 pg   MCHC 32.5 30.0 - 36.0 g/dL   RDW 86.7 88.4 - 84.4 %   Platelets 653 (H) 150 - 400 K/uL   nRBC 0.0 0.0 - 0.2 %   Neutrophils Relative % 62 %   Neutro Abs 4.7 1.7 - 7.7 K/uL   Lymphocytes Relative 29 %   Lymphs Abs 2.2 0.7 - 4.0 K/uL   Monocytes Relative 7 %   Monocytes Absolute 0.6 0.1 - 1.0 K/uL   Eosinophils Relative 1 %   Eosinophils Absolute 0.1 0.0 - 0.5 K/uL   Basophils Relative 1 %   Basophils Absolute 0.0 0.0 - 0.1 K/uL   Immature Granulocytes 0 %   Abs Immature Granulocytes 0.03 0.00 - 0.07 K/uL    Comment: Performed at [facility], [address], [place], [place] [address]  Comprehensive metabolic panel     Status: Abnormal   Collection Time: 03/18/24 10:10 PM  Result Value Ref Range   Sodium 137  135 - 145 mmol/L   Potassium 3.8 3.5 - 5.1 mmol/L   Chloride 102 98 - 111 mmol/L   CO2 22 22 - 32 mmol/L   Glucose, Bld 90 70 - 99 mg/dL    Comment: Glucose reference range applies only to samples taken after fasting for at least 8 hours.   BUN 17 8 - 23 mg/dL   Creatinine, Ser 9.15 0.44 - 1.00 mg/dL   Calcium 9.4 8.9 - 89.6 mg/dL   Total Protein 6.8 6.5 - 8.1 g/dL   Albumin 3.0 (L) 3.5 - 5.0 g/dL   AST 12 (L) 15 - 41 U/L   ALT 17 0 - 44 U/L   Alkaline Phosphatase 65 38 - 126 U/L   Total Bilirubin 0.7 0.0 - 1.2 mg/dL   GFR, Estimated >39 >39 mL/min    Comment: (NOTE) Calculated using the CKD-EPI Creatinine Equation (2021)    Anion  gap 13 5 - 15    Comment: Performed at [facility], [address], [place], [place] [address]  Lipase, blood     Status: None   Collection Time: 03/18/24 10:10 PM  Result Value Ref Range   Lipase 31 11 - 51 U/L    Comment: Performed at [facility], [address] [place], [place] [address]  I-Stat CG4 Lactic Acid     Status: None   Collection Time: 03/18/24 10:16 PM  Result Value Ref Range   Lactic Acid, Venous 0.6 0.5 - 1.9 mmol/L  Type and screen [facility]     Status: None (Preliminary result)   Collection Time: 03/18/24 11:56 PM  Result Value Ref Range   ABO/RH(D) A NEG    Antibody Screen NEG    Sample Expiration 03/21/2024,2359    Unit Number [id number]    Blood Component Type RED CELLS,LR    Unit division 00    Status of Unit ALLOCATED    Transfusion Status OK TO TRANSFUSE    Crossmatch Result      Compatible Performed at [facility], [address], [place], [place] [address]   I-Stat CG4 Lactic Acid     Status: None   Collection Time: 03/19/24 12:04 AM  Result Value Ref Range   Lactic Acid, Venous 0.7 0.5 - 1.9 mmol/L  Prepare RBC (crossmatch)     Status: None   Collection Time: 03/19/24 12:45 AM  Result Value Ref Range   Order Confirmation      ORDER PROCESSED BY BLOOD BANK Performed at [facility], [address] [place], [place] [address]    CT ABDOMEN PELVIS W CONTRAST Result Date: 03/19/2024 EXAM: CT ABDOMEN AND PELVIS WITH CONTRAST 03/19/2024 12:16:32 AM TECHNIQUE: CT of the abdomen and pelvis was performed with the administration of intravenous contrast. Multiplanar reformatted images are provided for review. Automated exposure control, iterative reconstruction, and/or weight based adjustment of the mA/kV was utilized to reduce the radiation dose to as low as reasonably achievable. COMPARISON: 03/05/2024 CLINICAL HISTORY: LLQ abdominal pain. left lower  quadrant abdominal pain for about 1 day. She had abdominal hernia surgery 12 days ago. EMS report the site is swollen, but the incision looks normal, hx of breast ca, GFR>60. FINDINGS: LOWER CHEST: No acute abnormality. LIVER: The liver is unremarkable. GALLBLADDER AND BILE DUCTS: Gallbladder is unremarkable. No biliary ductal dilatation. SPLEEN: No acute abnormality. PANCREAS: No acute abnormality. ADRENAL GLANDS: No acute abnormality. KIDNEYS, URETERS AND BLADDER: No stones in the kidneys or  ureters. No hydronephrosis. No perinephric or periureteral stranding. Frank anterior bladder perforation (image 59) with associated 2.3 x 7.0 cm walled off fluid collection anteriorly in the extraperitoneal space (image 57) directly communicating with an additional 3.6 x 8.8 cm fluid collection in the left lower anterior abdominal wall (image 60). GI AND BOWEL: Small bowel resection with suture line in the left mid/lower abdomen. Normal appendix (image 45). No bowel obstruction. No bowel wall thickening. PERITONEUM AND RETROPERITONEUM: No free air. VASCULATURE: Atherosclerotic calcifications of the abdominal aorta and branch vessels, although patent. LYMPH NODES: No lymphadenopathy. REPRODUCTIVE ORGANS: Status post hysterectomy. BONES AND SOFT TISSUES: Mild degenerative changes of the lumbar spine. No acute osseous abnormality. No focal soft tissue abnormality. IMPRESSION: 1. Frank anterior bladder perforation. 2. Associated 2.3 x 7.0 cm walled off fluid collection in the extraperitoneal space. 3. Direct communication with additional 3.6 x 8.8 cm fluid collection in the left lower anterior abdominal wall. 4. Surgical consultation is advised. Electronically signed by: Pinkie Pebbles MD 03/19/2024 12:25 AM EDT RP Workstation: [id number]    Review of Systems  Constitutional:  Positive for appetite change. Negative for fever.  Gastrointestinal:  Positive for abdominal pain.  Genitourinary:  Positive for difficulty  urinating.  All other systems reviewed and are negative.   Blood pressure 134/61, pulse 79, temperature 98.3 F (36.8 C), temperature source Oral, resp. rate 18, SpO2 98%. Physical Exam Vitals reviewed.  Constitutional:      Appearance: She is well-developed.  HENT:     Head: Normocephalic and atraumatic.  Eyes:     General: No scleral icterus. Cardiovascular:     Rate and Rhythm: Normal rate.  Pulmonary:     Effort: Pulmonary effort is normal.  Abdominal:     General: There is no distension.     Palpations: Abdomen is soft.     Tenderness: There is abdominal tenderness (swelling in left groin, incision without infection) in the left lower quadrant.  Skin:    General: Skin is warm and dry.     Capillary Refill: Capillary refill takes less than 2 seconds.  Neurological:     General: No focal deficit present.     Mental Status: She is alert.      Assessment/Plan S/p femoral hernia repair, sbr 7/8 VB -concern for bladder injury on ct scan, have discussed with Dr Elisabeth of urology and foley in will do ct cysto, discussed with patient may need to return to OR for this -will need to figure out anemia first and there is not need to go urgently to OR now with foley in and her clinical condition- I am not sure where this blood loss is from as it does not sound GI in nature and no evidence of hematoma on CT  -NPO   Donnice Bury, MD 03/19/2024, 1:37 AM

## 2024-03-19 NOTE — Transfer of Care (Signed)
 Immediate Anesthesia Transfer of Care Note  Patient: Jillian Norton  Procedure(s) Performed: LAPAROTOMY, EXPLORATORY REPAIR, BLADDER (Abdomen)  Patient Location: PACU  Anesthesia Type:General  Level of Consciousness: awake and alert   Airway & Oxygen Therapy: Patient Spontanous Breathing and Patient connected to nasal cannula oxygen  Post-op Assessment: Report given to RN and Post -op Vital signs reviewed and stable  Post vital signs: Reviewed and stable  Last Vitals:  Vitals Value Taken Time  BP 156/103 03/19/24 16:40  Temp    Pulse 78 03/19/24 16:41  Resp 16 03/19/24 16:41  SpO2 97 % 03/19/24 16:41  Vitals shown include unfiled device data.  Last Pain:  Vitals:   03/19/24 1216  TempSrc:   PainSc: Asleep      Patients Stated Pain Goal: 0 (03/19/24 0444)  Complications: No notable events documented.

## 2024-03-19 NOTE — Progress Notes (Signed)
 PHARMACY - PHYSICIAN COMMUNICATION CRITICAL VALUE ALERT - BLOOD CULTURE IDENTIFICATION (BCID)  Jillian Norton is an 83 y.o. female who presented to [facility] on 03/18/2024 with a chief complaint of bladder repair  Assessment: 1/4 staph species, no Resistance   Current antibiotics: cipro   Changes to prescribed antibiotics recommended:  None, considered a contaminant  Results for orders placed or performed during the hospital encounter of 03/18/24  Blood Culture ID Panel (Reflexed) (Collected: 03/18/2024 10:10 PM)  Result Value Ref Range   Enterococcus faecalis NOT DETECTED NOT DETECTED   Enterococcus Faecium NOT DETECTED NOT DETECTED   Listeria monocytogenes NOT DETECTED NOT DETECTED   Staphylococcus species DETECTED (A) NOT DETECTED   Staphylococcus aureus (BCID) NOT DETECTED NOT DETECTED   Staphylococcus epidermidis NOT DETECTED NOT DETECTED   Staphylococcus lugdunensis NOT DETECTED NOT DETECTED   Streptococcus species NOT DETECTED NOT DETECTED   Streptococcus agalactiae NOT DETECTED NOT DETECTED   Streptococcus pneumoniae NOT DETECTED NOT DETECTED   Streptococcus pyogenes NOT DETECTED NOT DETECTED   A.calcoaceticus-baumannii NOT DETECTED NOT DETECTED   Bacteroides fragilis NOT DETECTED NOT DETECTED   Enterobacterales NOT DETECTED NOT DETECTED   Enterobacter cloacae complex NOT DETECTED NOT DETECTED   Escherichia coli NOT DETECTED NOT DETECTED   Klebsiella aerogenes NOT DETECTED NOT DETECTED   Klebsiella oxytoca NOT DETECTED NOT DETECTED   Klebsiella pneumoniae NOT DETECTED NOT DETECTED   Proteus species NOT DETECTED NOT DETECTED   Salmonella species NOT DETECTED NOT DETECTED   Serratia marcescens NOT DETECTED NOT DETECTED   Haemophilus influenzae NOT DETECTED NOT DETECTED   Neisseria meningitidis NOT DETECTED NOT DETECTED   Pseudomonas aeruginosa NOT DETECTED NOT DETECTED   Stenotrophomonas maltophilia NOT DETECTED NOT DETECTED   Candida albicans NOT DETECTED NOT  DETECTED   Candida auris NOT DETECTED NOT DETECTED   Candida glabrata NOT DETECTED NOT DETECTED   Candida krusei NOT DETECTED NOT DETECTED   Candida parapsilosis NOT DETECTED NOT DETECTED   Candida tropicalis NOT DETECTED NOT DETECTED   Cryptococcus neoformans/gattii NOT DETECTED NOT DETECTED    Leeroy Mace RPh 03/19/2024, 10:11 PM

## 2024-03-19 NOTE — Plan of Care (Signed)
  Problem: Education: Goal: Knowledge of General Education information will improve Description: Including pain rating scale, medication(s)/side effects and non-pharmacologic comfort measures Outcome: Progressing   Problem: Clinical Measurements: Goal: Will remain free from infection Outcome: Progressing Goal: Diagnostic test results will improve Outcome: Progressing   Problem: Coping: Goal: Level of anxiety will decrease Outcome: Progressing   Problem: Elimination: Goal: Will not experience complications related to bowel motility Outcome: Progressing Goal: Will not experience complications related to urinary retention Outcome: Progressing   Problem: Pain Managment: Goal: General experience of comfort will improve and/or be controlled Outcome: Progressing   Problem: Safety: Goal: Ability to remain free from injury will improve Outcome: Progressing   Problem: Skin Integrity: Goal: Risk for impaired skin integrity will decrease Outcome: Progressing

## 2024-03-19 NOTE — Anesthesia Preprocedure Evaluation (Addendum)
 Anesthesia Evaluation  Patient identified by MRN, date of birth, ID band Patient awake    Reviewed: Allergy & Precautions, NPO status , Patient's Chart, lab work & pertinent test results  Airway Mallampati: II  TM Distance: >3 FB Neck ROM: Full    Dental no notable dental hx.    Pulmonary former smoker   Pulmonary exam normal        Cardiovascular negative cardio ROS  Rhythm:Regular Rate:Normal     Neuro/Psych   Anxiety     negative neurological ROS     GI/Hepatic Neg liver ROS,GERD  ,,  Endo/Other  negative endocrine ROS    Renal/GU negative Renal ROS Bladder dysfunction      Musculoskeletal  (+) Arthritis ,    Abdominal Normal abdominal exam  (+)   Peds  Hematology Lab Results      Component                Value               Date                      WBC                      5.2                 03/19/2024                HGB                      10.1 (L)            03/19/2024                HCT                      31.7 (L)            03/19/2024                MCV                      92.2                03/19/2024                PLT                      484 (H)             03/19/2024             Lab Results      Component                Value               Date                      NA                       134 (L)             03/19/2024                K                        3.7  03/19/2024                CO2                      24                  03/19/2024                GLUCOSE                  97                  03/19/2024                BUN                      12                  03/19/2024                CREATININE               0.68                03/19/2024                CALCIUM                  8.7 (L)             03/19/2024                GFRNONAA                 >60                 03/19/2024              Anesthesia Other Findings Inguinal hernia repair complicated by  bladder perforation   Reproductive/Obstetrics                              Anesthesia Physical Anesthesia Plan  ASA: 3  Anesthesia Plan: General   Post-op Pain Management: Ofirmev  IV (intra-op)*   Induction: Intravenous  PONV Risk Score and Plan: 3 and Ondansetron , Dexamethasone  and Treatment may vary due to age or medical condition  Airway Management Planned: Oral ETT and Mask  Additional Equipment: None  Intra-op Plan:   Post-operative Plan: Extubation in OR  Informed Consent: I have reviewed the patients History and Physical, chart, labs and discussed the procedure including the risks, benefits and alternatives for the proposed anesthesia with the patient or authorized representative who has indicated his/her understanding and acceptance.     Dental advisory given  Plan Discussed with: CRNA  Anesthesia Plan Comments:          Anesthesia Quick Evaluation

## 2024-03-19 NOTE — Plan of Care (Signed)

## 2024-03-19 NOTE — ED Notes (Signed)
 Lab is tubing down the paper copy of results, unable to upload in computer at this time. Hemoglobin came back verified at 10.2

## 2024-03-19 NOTE — Consult Note (Signed)
 I have been asked to see the patient by Dr. Adina Bury for evaluation and management of bladder rupture.  History of present illness: 83 year old woman with left femoral hernia repair with small bowel resection on 03/03/2024.  She had immediate pain postoperatively and a Foley catheter was placed due to urinary retention.  She had a small fluid collection in the left groin.  Prior to the hernia repair she was having difficulty voiding.  She has been experiencing progressive left groin swelling and pain.  CT scan showed anterior bladder rupture with large fluid collection over the left groin.  Urology was consulted for bladder rupture.   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   Current Outpatient Medications on File Prior to Encounter  Medication Sig Dispense Refill   moxifloxacin  (VIGAMOX ) 0.5 % ophthalmic solution Apply 1 drop to eye.     acetaminophen  (TYLENOL ) 500 MG tablet Take 2 tablets (1,000 mg total) by mouth every 6 (six) hours as needed.     albuterol  (VENTOLIN  HFA) 108 (90 Base) MCG/ACT inhaler Use 2 puffs 2x a day prn Inhalation as directed 30 days 18 g 0   buPROPion   (WELLBUTRIN  XL) 300 MG 24 hr tablet Take 1 tablet (300 mg total) by mouth in the morning. 90 tablet 2   busPIRone  (BUSPAR ) 5 MG tablet Take 1 tablet (5 mg total) by mouth daily. 30 tablet 5   diclofenac Sodium (VOLTAREN) 1 % GEL Apply 1 Application topically daily as needed (pain).     EPINEPHrine  (EPIPEN  2-PAK) 0.3 mg/0.3 mL IJ SOAJ injection as directed Injection daily 1 days (Patient taking differently: as needed for anaphylaxis.) 2 each 0   gabapentin  (NEURONTIN ) 100 MG capsule Take 2 capsules (200 mg total) by mouth 3 (three) times daily for 7 days. 42 capsule 0   methocarbamol  (ROBAXIN ) 500 MG tablet Take 1 tablet (500 mg total) by mouth every 8 (eight) hours as needed for muscle spasms. 30 tablet 0   omeprazole  (PRILOSEC) 40 MG capsule Take 1 capsule (40 mg total) by mouth daily. 90 capsule 3   oxyCODONE  (OXY IR/ROXICODONE ) 5 MG immediate release tablet Take 1 tablet (5 mg total) by mouth every 6 (six) hours as needed for severe pain (pain score 7-10) (not relieved by tylenol  or robaxin ). 10 tablet 0   Semaglutide -Weight Management (WEGOVY ) 2.4 MG/0.75ML SOAJ Inject 2.4 mg into the skin once a week as directed 9 mL 3   senna-docusate (SENOKOT-S) 8.6-50 MG tablet Take 1 tablet by mouth at bedtime. 14 tablet 0   triamcinolone  cream (KENALOG ) 0.1 % Apply 1 application on the skin twice a day Apply thin layer to affected areas twice a day x 1-3 weeks as needed. 80 g 0  valACYclovir  (VALTREX ) 1000 MG tablet Take 1 tablet (1,000 mg total) by mouth 2 (two) times daily for 4 days as needed for cold sores. 30 tablet 0   zolpidem  (AMBIEN ) 10 MG tablet Take 10 mg by mouth at bedtime as needed for sleep.     zolpidem  (AMBIEN ) 5 MG tablet Take 1-2 tablets (5-10 mg total) by mouth at bedtime as needed as directed 45 tablet 1    Past Medical History:  Diagnosis Date   Anxiety    Breast cancer (HCC) 06/27/2012   Right   Cataract    DVT (deep venous thrombosis) (HCC) 06/28/2015   Dyslipidemia    GERD  (gastroesophageal reflux disease)    Insomnia    Osteopenia    Overactive bladder    Personal history of radiation therapy    Shingles 06/2016   Thyroid  disease     Past Surgical History:  Procedure Laterality Date   BOWEL RESECTION N/A 03/03/2024   Procedure: EXCISION, SMALL INTESTINE;  Surgeon: Ann Fine, MD;  Location: [facility] OR;  Service: General;  Laterality: N/A;   BREAST LUMPECTOMY Right 2014   BREAST LUMPECTOMY  2013   right   CATARACT EXTRACTION W/ INTRAOCULAR LENS IMPLANT Bilateral    COLONOSCOPY  08/27/2012   [place]    INGUINAL HERNIA REPAIR N/A 03/03/2024   Procedure: REPAIR, HERNIA, INGUINAL/FEMORAL, ADULT;  Surgeon: Ann Fine, MD;  Location: [facility] OR;  Service: General;  Laterality: N/A;  LEFT INGUINAL HERNIA  REPAIR POSSIBLE LAPAROTOMY WITH SMALL BOWEL RESECTION   INSERTION OF MESH Left 03/03/2024   Procedure: INSERTION OF VICRYL MESH, LEFT GROIN;  Surgeon: Ann Fine, MD;  Location: [facility] OR;  Service: General;  Laterality: Left;   KNEE ARTHROSCOPY     left   LAPAROSCOPIC ABDOMINAL EXPLORATION N/A 03/03/2024   Procedure: EXPLORATION, ABDOMEN, LAPAROSCOPIC;  Surgeon: Ann Fine, MD;  Location: [facility] OR;  Service: General;  Laterality: N/A;   LASIK Bilateral    REDUCTION MAMMAPLASTY Bilateral 1985   anchor scar    VAGINAL HYSTERECTOMY  2002    Social History   Tobacco Use   Smoking status: Former    Current packs/day: 1.00    Average packs/day: 1 pack/day for 10.0 years (10.0 ttl pk-yrs)    Types: Cigarettes   Smokeless tobacco: Never  Vaping Use   Vaping status: Never Used  Substance Use Topics   Alcohol  use: Yes    Comment: 1-2 drinks per day   Drug use: No    Family History  Problem Relation Age of Onset   Lung cancer Mother    Colon cancer Father     PE: Vitals:   03/18/24 2335 03/19/24 0151 03/19/24 0302 03/19/24 0742  BP:  (!) 117/57 (!) 116/56 105/68  Pulse:  77 80 84  Resp:  18 18 18   Temp: 98.3 F (36.8 C) 98.2 F (36.8 C) 98.2  F (36.8 C) 97.8 F (36.6 C)  TempSrc: Oral Oral Oral   SpO2:   100% 99%  Weight:   64.1 kg   Height:   5' 4 (1.626 m)    Patient appears to be in no acute distress  patient is alert and oriented x3 Atraumatic normocephalic head No cervical or supraclavicular lymphadenopathy appreciated No increased work of breathing, no audible wheezes/rhonchi Regular sinus rhythm/rate Abdomen left lower quadrant incision clean/dry/intact, swelling in the left lower quadrant/groin, tender to palpation Foley catheter in place draining clear urine Lower extremities are symmetric without appreciable edema Grossly neurologically intact No identifiable skin  lesions  Recent Labs    03/18/24 2210 03/19/24 0130 03/19/24 0531  WBC 7.7 6.0 5.2  HGB 6.8* 10.2* 10.1*  HCT 20.9* 32.2* 31.7*   Recent Labs    03/18/24 2210 03/19/24 0531  NA 137 134*  K 3.8 3.7  CL 102 101  CO2 22 24  GLUCOSE 90 97  BUN 17 12  CREATININE 0.84 0.68  CALCIUM 9.4 8.7*   No results for input(s): LABPT, INR in the last 72 hours. No results for input(s): LABURIN in the last 72 hours. Results for orders placed or performed during the hospital encounter of 03/03/24  Surgical pcr screen     Status: None   Collection Time: 03/03/24  7:57 AM   Specimen: Nasal Mucosa; Nasal Swab  Result Value Ref Range Status   MRSA, PCR NEGATIVE NEGATIVE Final   Staphylococcus aureus NEGATIVE NEGATIVE Final    Comment: (NOTE) The Xpert SA Assay (FDA approved for NASAL specimens in patients 20 years of age and older), is one component of a comprehensive surveillance program. It is not intended to diagnose infection nor to guide or monitor treatment. Performed at [facility], [address] [place], [place] [address]   Urine Culture     Status: None   Collection Time: 03/06/24  9:04 AM   Specimen: Urine, Catheterized  Result Value Ref Range Status   Specimen Description URINE, CATHETERIZED  Final   Special Requests  NONE  Final   Culture   Final    NO GROWTH Performed at [facility], [address] [place], [place] [address]    Report Status 03/07/2024 FINAL  Final    Imaging: CT Abd/Pelvis 7/23/25IMPRESSION: 1. Frank anterior bladder perforation. 2. Associated 2.3 x 7.0 cm walled off fluid collection in the extraperitoneal space. 3. Direct communication with additional 3.6 x 8.8 cm fluid collection in the left lower anterior abdominal wall. 4. Surgical consultation is advised.   Electronically signed by: Pinkie Pebbles MD 03/19/2024 12:25 AM  CT Cystogram 7/24/25IMPRESSION: 1. Frank perforation of the anterior bladder with associated extraperitoneal contrast extravasation. 2. Contrast within a 3.5 x 8.1 cm fluid collection in the left lower anterior pelvic wall, previously demonstrated to communicate with the extraperitoneal space.   Electronically signed by: Pinkie Pebbles MD 03/19/2024 02:31 AM    Assessment/Plan:  Bladder rupture: - Based on symptoms and CT imaging consistent with bladder rupture - Recommend urology and general surgery explore together to decompress fluid collection and repair bladder - Risks and benefits of procedure discussed with patient and her son via the phone.  In addition we discussed that she will have a Foley catheter for 10 to 14 days following the repair and a cystogram prior to removal. - Oxybutynin  for bladder spasms right now - Continue n.p.o.   Please page with any further questions or concerns. Ariany Kesselman D Cayce Paschal

## 2024-03-19 NOTE — ED Notes (Signed)
 Patient back from CT.

## 2024-03-19 NOTE — ED Notes (Signed)
 Patient taken to CT.

## 2024-03-19 NOTE — Anesthesia Procedure Notes (Signed)
 Procedure Name: Intubation Date/Time: 03/19/2024 3:12 PM  Performed by: Dartha Meckel, CRNAPre-anesthesia Checklist: Patient identified, Emergency Drugs available, Suction available and Patient being monitored Patient Re-evaluated:Patient Re-evaluated prior to induction Oxygen Delivery Method: Circle system utilized Preoxygenation: Pre-oxygenation with 100% oxygen Induction Type: IV induction Ventilation: Mask ventilation without difficulty Laryngoscope Size: Mac and 3 Grade View: Grade I Tube type: Oral Tube size: 7.0 mm Number of attempts: 1 Airway Equipment and Method: Stylet and Oral airway Placement Confirmation: ETT inserted through vocal cords under direct vision, positive ETCO2 and breath sounds checked- equal and bilateral Secured at: 21 cm Tube secured with: Tape Dental Injury: Teeth and Oropharynx as per pre-operative assessment

## 2024-03-19 NOTE — ED Notes (Signed)
 Patient resting in the bed at this time. Patient has no needs expressed currently.

## 2024-03-20 ENCOUNTER — Encounter (HOSPITAL_COMMUNITY): Payer: Self-pay | Admitting: General Surgery

## 2024-03-20 ENCOUNTER — Ambulatory Visit (HOSPITAL_BASED_OUTPATIENT_CLINIC_OR_DEPARTMENT_OTHER)

## 2024-03-20 LAB — CBC
HCT: 32.1 % — ABNORMAL LOW (ref 36.0–46.0)
Hemoglobin: 10.4 g/dL — ABNORMAL LOW (ref 12.0–15.0)
MCH: 29.8 pg (ref 26.0–34.0)
MCHC: 32.4 g/dL (ref 30.0–36.0)
MCV: 92 fL (ref 80.0–100.0)
Platelets: 499 K/uL — ABNORMAL HIGH (ref 150–400)
RBC: 3.49 MIL/uL — ABNORMAL LOW (ref 3.87–5.11)
RDW: 12.9 % (ref 11.5–15.5)
WBC: 8.5 K/uL (ref 4.0–10.5)
nRBC: 0 % (ref 0.0–0.2)

## 2024-03-20 LAB — BASIC METABOLIC PANEL WITH GFR
Anion gap: 9 (ref 5–15)
BUN: 13 mg/dL (ref 8–23)
CO2: 28 mmol/L (ref 22–32)
Calcium: 9.2 mg/dL (ref 8.9–10.3)
Chloride: 101 mmol/L (ref 98–111)
Creatinine, Ser: 0.71 mg/dL (ref 0.44–1.00)
GFR, Estimated: >60 mL/min (ref >60)
Glucose, Bld: 167 mg/dL — ABNORMAL HIGH (ref 70–99)
Potassium: 4.1 mmol/L (ref 3.5–5.1)
Sodium: 138 mmol/L (ref 135–145)

## 2024-03-20 LAB — CULTURE, BLOOD (ROUTINE X 2): Special Requests: ADEQUATE

## 2024-03-20 LAB — CREATININE, FLUID (PLEURAL, PERITONEAL, JP DRAINAGE): Creat, Fluid: 0.7 mg/dL

## 2024-03-20 LAB — URINE CULTURE: Culture: <10000 — AB

## 2024-03-20 MED ORDER — POLYETHYLENE GLYCOL 3350 17 G PO PACK
17.0000 g | PACK | Freq: Every day | ORAL | Status: DC | PRN
  Administered 2024-03-20 – 2024-03-21 (×2): 17 g via ORAL
  Filled 2024-03-20 (×2): qty 1

## 2024-03-20 MED ORDER — GABAPENTIN 100 MG PO CAPS
200.0000 mg | ORAL_CAPSULE | Freq: Three times a day (TID) | ORAL | Status: DC
  Administered 2024-03-20 – 2024-03-23 (×10): 200 mg via ORAL
  Filled 2024-03-20 (×10): qty 2

## 2024-03-20 MED ORDER — ACETAMINOPHEN 650 MG RE SUPP: 650.0000 mg | Freq: Four times a day (QID) | RECTAL | Status: DC

## 2024-03-20 MED ORDER — METHOCARBAMOL 500 MG PO TABS
500.0000 mg | ORAL_TABLET | Freq: Three times a day (TID) | ORAL | Status: DC
  Administered 2024-03-20 – 2024-03-23 (×10): 500 mg via ORAL
  Filled 2024-03-20 (×10): qty 1

## 2024-03-20 MED ORDER — ACETAMINOPHEN 500 MG PO TABS
1000.0000 mg | ORAL_TABLET | Freq: Four times a day (QID) | ORAL | Status: DC
  Administered 2024-03-20 – 2024-03-24 (×16): 1000 mg via ORAL
  Filled 2024-03-20 (×17): qty 2

## 2024-03-20 MED ORDER — KETOROLAC TROMETHAMINE 15 MG/ML IJ SOLN
15.0000 mg | Freq: Four times a day (QID) | INTRAMUSCULAR | Status: DC
  Administered 2024-03-20 – 2024-03-22 (×9): 15 mg via INTRAVENOUS
  Filled 2024-03-20 (×9): qty 1

## 2024-03-20 NOTE — Progress Notes (Signed)
 PT Cancellation Note  Patient Details Name: Jillian Norton MRN: [id number] DOB: 12/07/1940   Cancelled Treatment:    Reason Eval/Treat Not Completed: Pain limiting ability to participate. Per nursing, pt with significant pain after OT therapy session this morning. RN states doctor wants to hold more therapy until tomorrow. Will continue to follow for PT Eval.   Tori Gabrille Kilbride PT, DPT 03/20/24, 11:59 AM

## 2024-03-20 NOTE — Anesthesia Postprocedure Evaluation (Signed)
 Anesthesia Post Note  Patient: Jillian Norton  Procedure(s) Performed: LAPAROTOMY, EXPLORATORY REPAIR, BLADDER (Abdomen)     Patient location during evaluation: PACU Anesthesia Type: General Level of consciousness: awake and alert, oriented and patient cooperative Pain management: pain level controlled Vital Signs Assessment: post-procedure vital signs reviewed and stable Respiratory status: spontaneous breathing, nonlabored ventilation and respiratory function stable Cardiovascular status: blood pressure returned to baseline and stable Postop Assessment: no apparent nausea or vomiting Anesthetic complications: no   No notable events documented.  Last Vitals:  Vitals:   03/19/24 1808 03/19/24 2101  BP: (!) 110/56 (!) 113/53  Pulse: 64 78  Resp:  18  Temp: 36.4 C (!) 36.4 C  SpO2:  99%    Last Pain:  Vitals:   03/20/24 0235  TempSrc:   PainSc: 0-No pain                 [name]

## 2024-03-20 NOTE — Progress Notes (Addendum)
 Progress Note  1 Day Post-Op  Interval: POD 1 from cystorrhaphy. No nausea, biggest complaint is burning nerve pain over left groin through ASIS, Other pressure/pain pre-op has much improved.   Objective: Vital signs in last 24 hours: Temp:  [97.5 F (36.4 C)-99.8 F (37.7 C)] 98.4 F (36.9 C) (07/25 0550) Pulse Rate:  [64-90] 68 (07/25 0550) Resp:  [11-20] 18 (07/25 0550) BP: (105-156)/(53-103) 109/53 (07/25 0550) SpO2:  [93 %-100 %] 99 % (07/24 2101) Weight:  [64.1 kg] 64.1 kg (07/24 1430) Last BM Date : 03/18/24  Intake/Output from previous day: 07/24 0701 - 07/25 0700 In: 2851.4 [P.O.:120; I.V.:1831.4; IV Piggyback:900] Out: 2410 [Urine:2300; Drains:30; Blood:80] Intake/Output this shift: No intake/output data recorded.  PE: General: NAD, pleasant female CV: RRR Pulm: NWOB on room air Abd: Soft, tender to palpation along surgical incision, very sensitive to light touch around surgical site--patient states burning sensation, JP drain with SS output, 30cc since OR. GU: foley in place   Lab Results:  Recent Labs    03/19/24 0130 03/19/24 0531  WBC 6.0 5.2  HGB 10.2* 10.1*  HCT 32.2* 31.7*  PLT 474* 484*   BMET Recent Labs    03/18/24 2210 03/19/24 0531  NA 137 134*  K 3.8 3.7  CL 102 101  CO2 22 24  GLUCOSE 90 97  BUN 17 12  CREATININE 0.84 0.68  CALCIUM 9.4 8.7*   PT/INR No results for input(s): LABPROT, INR in the last 72 hours. CMP     Component Value Date/Time   NA 134 (L) 03/19/2024 0531   NA 139 11/11/2015 0000   K 3.7 03/19/2024 0531   CL 101 03/19/2024 0531   CO2 24 03/19/2024 0531   GLUCOSE 97 03/19/2024 0531   BUN 12 03/19/2024 0531   BUN 20 11/11/2015 0000   CREATININE 0.68 03/19/2024 0531   CREATININE 0.88 05/27/2017 1417   CALCIUM 8.7 (L) 03/19/2024 0531   PROT 6.8 03/18/2024 2210   ALBUMIN 3.0 (L) 03/18/2024 2210   AST 12 (L) 03/18/2024 2210   ALT 17 03/18/2024 2210   ALKPHOS 65 03/18/2024 2210   BILITOT 0.7  03/18/2024 2210   GFRNONAA >60 03/19/2024 0531   GFRNONAA 64 05/27/2017 1417   GFRAA 74 05/27/2017 1417   Lipase     Component Value Date/Time   LIPASE 31 03/18/2024 2210       Studies/Results: CT CYSTOGRAM PELVIS Result Date: 03/19/2024 EXAM: CT UROGRAM 03/19/2024 02:26:42 AM TECHNIQUE: CT of the abdomen and pelvis was performed before and after the administration of intravenous contrast as per CT urogram protocol. Multiplanar reformatted images as well as MIP urogram images are provided for review. Automated exposure control, iterative reconstruction, and/or weight based adjustment of the mA/kV was utilized to reduce the radiation dose to as low as reasonably achievable. COMPARISON: CT abdomen/pelvis earlier today. CLINICAL HISTORY: Bladder diverticulum. S/p left inguinal hernia surgery, abnormality of the bladder seen on an earlier CT; 100 mL Omni via urinary catheter. FINDINGS: LOWER CHEST: No acute abnormality. LIVER: The liver is unremarkable. GALLBLADDER AND BILE DUCTS: Gallbladder is unremarkable. No biliary ductal dilatation. SPLEEN: No acute abnormality. PANCREAS: No acute abnormality. ADRENAL GLANDS: No acute abnormality. KIDNEYS, URETERS AND BLADDER: No stones in the kidneys or ureters. No hydronephrosis. No perinephric or periureteral stranding. High density contrast administered via indwelling Foley catheter extravasates into the extraperitoneal space anterior to the bladder (image 6). Associated frank perforation of the anterior bladder (image 41). Additional contrast within the 3.5 x  8.1 cm fluid collection in the left lower anterior pelvic wall (image 7), previously demonstrated to communicate with the extraperitoneal space. GI AND BOWEL: Stomach demonstrates no acute abnormality. There is no bowel obstruction. PERITONEUM AND RETROPERITONEUM: No ascites. No free air. VASCULATURE: Aorta is normal in caliber. LYMPH NODES: No lymphadenopathy. REPRODUCTIVE ORGANS: No acute abnormality.  BONES AND SOFT TISSUES: No acute osseous abnormality. No focal soft tissue abnormality. IMPRESSION: 1. Frank perforation of the anterior bladder with associated extraperitoneal contrast extravasation. 2. Contrast within a 3.5 x 8.1 cm fluid collection in the left lower anterior pelvic wall, previously demonstrated to communicate with the extraperitoneal space. Electronically signed by: Pinkie Pebbles MD 03/19/2024 02:31 AM EDT RP Workstation: [id number]   CT ABDOMEN PELVIS W CONTRAST Result Date: 03/19/2024 EXAM: CT ABDOMEN AND PELVIS WITH CONTRAST 03/19/2024 12:16:32 AM TECHNIQUE: CT of the abdomen and pelvis was performed with the administration of intravenous contrast. Multiplanar reformatted images are provided for review. Automated exposure control, iterative reconstruction, and/or weight based adjustment of the mA/kV was utilized to reduce the radiation dose to as low as reasonably achievable. COMPARISON: 03/05/2024 CLINICAL HISTORY: LLQ abdominal pain. left lower quadrant abdominal pain for about 1 day. She had abdominal hernia surgery 12 days ago. EMS report the site is swollen, but the incision looks normal, hx of breast ca, GFR>60. FINDINGS: LOWER CHEST: No acute abnormality. LIVER: The liver is unremarkable. GALLBLADDER AND BILE DUCTS: Gallbladder is unremarkable. No biliary ductal dilatation. SPLEEN: No acute abnormality. PANCREAS: No acute abnormality. ADRENAL GLANDS: No acute abnormality. KIDNEYS, URETERS AND BLADDER: No stones in the kidneys or ureters. No hydronephrosis. No perinephric or periureteral stranding. Frank anterior bladder perforation (image 59) with associated 2.3 x 7.0 cm walled off fluid collection anteriorly in the extraperitoneal space (image 57) directly communicating with an additional 3.6 x 8.8 cm fluid collection in the left lower anterior abdominal wall (image 60). GI AND BOWEL: Small bowel resection with suture line in the left mid/lower abdomen. Normal appendix (image  45). No bowel obstruction. No bowel wall thickening. PERITONEUM AND RETROPERITONEUM: No free air. VASCULATURE: Atherosclerotic calcifications of the abdominal aorta and branch vessels, although patent. LYMPH NODES: No lymphadenopathy. REPRODUCTIVE ORGANS: Status post hysterectomy. BONES AND SOFT TISSUES: Mild degenerative changes of the lumbar spine. No acute osseous abnormality. No focal soft tissue abnormality. IMPRESSION: 1. Frank anterior bladder perforation. 2. Associated 2.3 x 7.0 cm walled off fluid collection in the extraperitoneal space. 3. Direct communication with additional 3.6 x 8.8 cm fluid collection in the left lower anterior abdominal wall. 4. Surgical consultation is advised. Electronically signed by: Pinkie Pebbles MD 03/19/2024 12:25 AM EDT RP Workstation: [id number]     Assessment/Plan 83 yo F s/p femoral hernia repair and small bowel resection 7/8 VB complicated by bladder injury seen on CT 7/23, now s/p cystorrhaphy  LOS: 1 day   - Regular diet - Multimodal pain control, will add gabapentin  for nerve pain today. If creatinine normal on labs will add toradol  - JP creatinine today per urology, ordered - Continue JP drain  - PT/OT, mobilize, OOB - Continue foley for 14 days, plan for cystogram prior to foley removal - lovenox  for DVT ppx - Dispo: med-surg  I reviewed Consultant urology notes, last 24 h vitals and pain scores, last 48 h intake and output, last 24 h labs and trends, and last 24 h imaging results.  This care required moderate level of medical decision making.    Richerd Silversmith, MD [facility] Surgery 03/20/2024, 7:17  AM Please see Amion for pager number during day hours 7:00am-4:30pm

## 2024-03-20 NOTE — Evaluation (Addendum)
 Occupational Therapy Evaluation Patient Details Name: Jillian Norton MRN: [id number] DOB: 02/19/1941 Today's Date: 03/20/2024   History of Present Illness   Jillian Norton is an 83 yo F s/p femoral hernia repair and small bowel resection 7/8 VB complicated by bladder injury seen on CT 7/23, now s/p cystorrhaphy PMH: femoral hernia, De Quervain's R, OA L thumb, DVT, R breast Ca, GERD, shingles     Clinical Impressions PTA, patient had been home just 2 days from [facility] sn[facility] level rehab as patient lives in [place] portion of [place] independently (drives, golfs and swims daily) prior to original surgery early July/2025. Was discharged home then back to hospital for this surgery. Currently, patient presents with deficits outlined below (see OT Problem List for details) most significantly pain with decreased activity tolerance, LB strength and balance limiting BADL's and mobility. Pain at initiation of therapy 4/10- bed mobility, short in room ambulation- back to bed, escalated to 10/10 with supported bridging bed level to reposition. RN present and ice and pillow hug utilized with mobility for support. OT heavy instruction throughout on slow position changes, log rolling and abdominal support with mobility and pain monitoring.  Nursing to message MD. Patient requires continued Acute care hospital level OT services to progress safety and functional performance and allow for discharge. Patient will benefit from continued inpatient follow up therapy, <3 hours/day.       If plan is discharge home, recommend the following:   A lot of help with walking and/or transfers;A lot of help with bathing/dressing/bathroom;Assistance with cooking/housework;Assist for transportation;Help with stairs or ramp for entrance     Functional Status Assessment   Patient has had a recent decline in their functional status and demonstrates the ability to make significant improvements in function in a  reasonable and predictable amount of time.     Equipment Recommendations   Tub/shower seat;Other (comment) (may benefit from RW for pain  management if d/c'd home vs [facility] rehab)      Precautions/Restrictions   Precautions Precautions: Fall Restrictions Weight Bearing Restrictions Per Provider Order: No     Mobility Bed Mobility Overal bed mobility: Needs Assistance Bed Mobility: Rolling, Sidelying to Sit, Supine to Sit, Sit to Supine, Sit to Sidelying Rolling: Min assist, Used rails Sidelying to sit: Max assist, HOB elevated, Used rails Supine to sit: Max assist, HOB elevated, Used rails Sit to supine: Max assist, HOB elevated, Used rails   General bed mobility comments: significant pain despite log rolling and pillow hug with need for upper trunk assist and LB management    Transfers Overall transfer level: Needs assistance Equipment used: None Transfers: Sit to/from Stand, Bed to chair/wheelchair/BSC Sit to Stand: Mod assist, From elevated surface     Step pivot transfers: Mod assist, From elevated surface     General transfer comment: patient had refused RW but would have benefitted for pain management and support therefore heavy reliance on therapist due to increased pain after initiating mobility starting at 4/10 at rest to 10/10 with sit to sidelying/bridging back into bed post mobility      Balance Overall balance assessment: Mild deficits observed, not formally tested                                         ADL either performed or assessed with clinical judgement   ADL Overall ADL's : Needs assistance/impaired Eating/Feeding: Set up;Bed  level   Grooming: Set up;Sitting;Wash/dry hands;Wash/dry face Grooming Details (indicate cue type and reason): pain in sitting unsupported Upper Body Bathing: Contact guard assist;Bed level   Lower Body Bathing: Moderate assistance;Bed level Lower Body Bathing Details (indicate cue type and  reason): unable to functional reach LB without AD due to pain Upper Body Dressing : Minimal assistance;Sitting   Lower Body Dressing: Maximal assistance;Bed level   Toilet Transfer:  (declined toilet/bathroom mobility due to pain)   Toileting- Clothing Manipulation and Hygiene: Maximal assistance Toileting - Clothing Manipulation Details (indicate cue type and reason): Foley for urine management declined trial to toilet due to pain     Functional mobility during ADLs: Moderate assistance General ADL Comments: poor activity tolerance due to pain     Vision Baseline Vision/History: 1 Wears glasses;0 No visual deficits Ability to See in Adequate Light: 0 Adequate Patient Visual Report: No change from baseline Vision Assessment?: No apparent visual deficits;Wears glasses for reading            Pertinent Vitals/Pain Pain Assessment Pain Assessment: 0-10 Pain Score: 4  Pain Location: L sided incisional and gas Pain Descriptors / Indicators: Discomfort, Guarding, Sore, Grimacing Pain Intervention(s): Limited activity within patient's tolerance, Monitored during session, Repositioned, Ice applied, RN gave pain meds during session, Relaxation, Other (comment) (pillow hug)     Extremity/Trunk Assessment Upper Extremity Assessment Upper Extremity Assessment: Right hand dominant;Overall WFL for tasks assessed   Lower Extremity Assessment Lower Extremity Assessment: Generalized weakness   Cervical / Trunk Assessment Cervical / Trunk Assessment: Normal   Communication Communication Communication: Impaired Factors Affecting Communication: Hearing impaired   Cognition Arousal: Alert Behavior During Therapy: WFL for tasks assessed/performed Cognition: No apparent impairments                               Following commands: Intact       Cueing  General Comments   Cueing Techniques: Verbal cues  incision/Foley in place and intact, use of pillow hug and log  rolling for pain management, poor activity tolerance, HR 76 and SpO2 on RA 100% post session           Home Living Family/patient expects to be discharged to:: Private residence Living Arrangements: Alone Available Help at Discharge: Neighbor;Friend(s);Available PRN/intermittently Type of Home: Apartment Home Access: Level entry     Home Layout: One level     Bathroom Shower/Tub: Walk-in shower;Door   [employer] Toilet: Handicapped height Bathroom Accessibility: Yes How Accessible: Accessible via walker Home Equipment: None          Prior Functioning/Environment Prior Level of Function : Independent/Modified Independent             Mobility Comments: Independent without AD prior to admission. Active golfer at [employer] ADLs Comments: Independent    OT Problem List: Decreased strength;Decreased activity tolerance;Impaired balance (sitting and/or standing);Pain   OT Treatment/Interventions: Self-care/ADL training;Therapeutic exercise;Energy conservation;DME and/or AE instruction;Therapeutic activities;Balance training;Patient/family education      OT Goals(Current goals can be found in the care plan section)   Acute Rehab OT Goals Patient Stated Goal: to feel better OT Goal Formulation: With patient Time For Goal Achievement: 04/03/24 Potential to Achieve Goals: Good ADL Goals Pt Will Perform Lower Body Bathing: with set-up;with adaptive equipment;sit to/from stand Pt Will Perform Lower Body Dressing: with set-up;with adaptive equipment;sit to/from stand Pt Will Transfer to Toilet: with supervision;ambulating;grab bars Pt Will Perform Toileting - Clothing Manipulation and hygiene:  with supervision;sit to/from stand Pt Will Perform Tub/Shower Transfer: ambulating;shower seat;with supervision Pt/caregiver will Perform Home Exercise Program: Increased strength;Independently;With written HEP provided Additional ADL Goal #1: Patient will teach back and integrate 5 Ps  into ADL's and mobility with min cues   OT Frequency:  Min 2X/week       AM-PAC OT 6 Clicks Daily Activity     Outcome Measure Help from another person eating meals?: A Little Help from another person taking care of personal grooming?: A Little Help from another person toileting, which includes using toliet, bedpan, or urinal?: A Lot Help from another person bathing (including washing, rinsing, drying)?: A Lot Help from another person to put on and taking off regular upper body clothing?: A Little Help from another person to put on and taking off regular lower body clothing?: A Lot 6 Click Score: 15   End of Session Equipment Utilized During Treatment: Gait belt Nurse Communication: Mobility status;Patient requests pain meds;Other (comment) (nursing present for pain med administration)  Activity Tolerance: Patient limited by pain Patient left: in bed;with bed alarm set;with nursing/sitter in room  OT Visit Diagnosis: Unsteadiness on feet (R26.81);Muscle weakness (generalized) (M62.81);Pain Pain - part of body:  (abdominal)                Time: 9059-8984 OT Time Calculation (min): 35 min Charges:  OT General Charges $OT Visit: 1 Visit OT Evaluation $OT Eval Low Complexity: 1 Low OT Treatments $Therapeutic Activity: 8-22 mins Maxamillian Tienda OT/L Acute Rehabilitation Department  [phone]  03/20/2024, 10:47 AM

## 2024-03-20 NOTE — Progress Notes (Signed)
 1 Day Post-Op Subjective: C/o pain around incision and LLQ. Denies nausea, emesis. Afebrile.  Objective: Vital signs in last 24 hours: Temp:  [97.5 F (36.4 C)-99.8 F (37.7 C)] 98.4 F (36.9 C) (07/25 0550) Pulse Rate:  [64-90] 68 (07/25 0550) Resp:  [11-20] 18 (07/25 0550) BP: (109-156)/(53-103) 109/53 (07/25 0550) SpO2:  [93 %-100 %] 99 % (07/24 2101) Weight:  [64.1 kg] 64.1 kg (07/24 1430)  Intake/Output from previous day: 07/24 0701 - 07/25 0700 In: 2851.4 [P.O.:120; I.V.:1831.4; IV Piggyback:900] Out: 2410 [Urine:2300; Drains:30; Blood:80] Intake/Output this shift: No intake/output data recorded. UOP: 2.3L JP: 30ml serous  Physical Exam:  General: Alert and oriented CV: RRR Lungs: Clear Abdomen: Soft, ND, tender around incision, no fluctuance, mild ecchymosis Ext: NT, No erythema  Lab Results: Recent Labs    03/18/24 2210 03/19/24 0130 03/19/24 0531  HGB 6.8* 10.2* 10.1*  HCT 20.9* 32.2* 31.7*   BMET Recent Labs    03/18/24 2210 03/19/24 0531  NA 137 134*  K 3.8 3.7  CL 102 101  CO2 22 24  GLUCOSE 90 97  BUN 17 12  CREATININE 0.84 0.68  CALCIUM 9.4 8.7*     Studies/Results: CT CYSTOGRAM PELVIS Result Date: 03/19/2024 EXAM: CT UROGRAM 03/19/2024 02:26:42 AM TECHNIQUE: CT of the abdomen and pelvis was performed before and after the administration of intravenous contrast as per CT urogram protocol. Multiplanar reformatted images as well as MIP urogram images are provided for review. Automated exposure control, iterative reconstruction, and/or weight based adjustment of the mA/kV was utilized to reduce the radiation dose to as low as reasonably achievable. COMPARISON: CT abdomen/pelvis earlier today. CLINICAL HISTORY: Bladder diverticulum. S/p left inguinal hernia surgery, abnormality of the bladder seen on an earlier CT; 100 mL Omni via urinary catheter. FINDINGS: LOWER CHEST: No acute abnormality. LIVER: The liver is unremarkable. GALLBLADDER AND BILE  DUCTS: Gallbladder is unremarkable. No biliary ductal dilatation. SPLEEN: No acute abnormality. PANCREAS: No acute abnormality. ADRENAL GLANDS: No acute abnormality. KIDNEYS, URETERS AND BLADDER: No stones in the kidneys or ureters. No hydronephrosis. No perinephric or periureteral stranding. High density contrast administered via indwelling Foley catheter extravasates into the extraperitoneal space anterior to the bladder (image 6). Associated frank perforation of the anterior bladder (image 41). Additional contrast within the 3.5 x 8.1 cm fluid collection in the left lower anterior pelvic wall (image 7), previously demonstrated to communicate with the extraperitoneal space. GI AND BOWEL: Stomach demonstrates no acute abnormality. There is no bowel obstruction. PERITONEUM AND RETROPERITONEUM: No ascites. No free air. VASCULATURE: Aorta is normal in caliber. LYMPH NODES: No lymphadenopathy. REPRODUCTIVE ORGANS: No acute abnormality. BONES AND SOFT TISSUES: No acute osseous abnormality. No focal soft tissue abnormality. IMPRESSION: 1. Frank perforation of the anterior bladder with associated extraperitoneal contrast extravasation. 2. Contrast within a 3.5 x 8.1 cm fluid collection in the left lower anterior pelvic wall, previously demonstrated to communicate with the extraperitoneal space. Electronically signed by: Pinkie Pebbles MD 03/19/2024 02:31 AM EDT RP Workstation: [id number]   CT ABDOMEN PELVIS W CONTRAST Result Date: 03/19/2024 EXAM: CT ABDOMEN AND PELVIS WITH CONTRAST 03/19/2024 12:16:32 AM TECHNIQUE: CT of the abdomen and pelvis was performed with the administration of intravenous contrast. Multiplanar reformatted images are provided for review. Automated exposure control, iterative reconstruction, and/or weight based adjustment of the mA/kV was utilized to reduce the radiation dose to as low as reasonably achievable. COMPARISON: 03/05/2024 CLINICAL HISTORY: LLQ abdominal pain. left lower quadrant  abdominal pain for about 1 day. She had  abdominal hernia surgery 12 days ago. EMS report the site is swollen, but the incision looks normal, hx of breast ca, GFR>60. FINDINGS: LOWER CHEST: No acute abnormality. LIVER: The liver is unremarkable. GALLBLADDER AND BILE DUCTS: Gallbladder is unremarkable. No biliary ductal dilatation. SPLEEN: No acute abnormality. PANCREAS: No acute abnormality. ADRENAL GLANDS: No acute abnormality. KIDNEYS, URETERS AND BLADDER: No stones in the kidneys or ureters. No hydronephrosis. No perinephric or periureteral stranding. Frank anterior bladder perforation (image 59) with associated 2.3 x 7.0 cm walled off fluid collection anteriorly in the extraperitoneal space (image 57) directly communicating with an additional 3.6 x 8.8 cm fluid collection in the left lower anterior abdominal wall (image 60). GI AND BOWEL: Small bowel resection with suture line in the left mid/lower abdomen. Normal appendix (image 45). No bowel obstruction. No bowel wall thickening. PERITONEUM AND RETROPERITONEUM: No free air. VASCULATURE: Atherosclerotic calcifications of the abdominal aorta and branch vessels, although patent. LYMPH NODES: No lymphadenopathy. REPRODUCTIVE ORGANS: Status post hysterectomy. BONES AND SOFT TISSUES: Mild degenerative changes of the lumbar spine. No acute osseous abnormality. No focal soft tissue abnormality. IMPRESSION: 1. Frank anterior bladder perforation. 2. Associated 2.3 x 7.0 cm walled off fluid collection in the extraperitoneal space. 3. Direct communication with additional 3.6 x 8.8 cm fluid collection in the left lower anterior abdominal wall. 4. Surgical consultation is advised. Electronically signed by: Pinkie Pebbles MD 03/19/2024 12:25 AM EDT RP Workstation: [id number]    Assessment/Plan: Bladder injury s/p complex cystorrhaphy 03/19/2024 Incarcerated femoral hernia s/p SBR and repair on 03/03/2024  -Pain control prn. Gabapentin  added by primary. -Check JP  creatinine and remove drain if seroequivalent -Labs appropriate -Keep foley for 2 weeks and will arrange for cystogram in our office prior to removal   LOS: 1 day   Matt R. Latamara Melder MD 03/20/2024, 8:08 AM [facility]  Pager: [phone]

## 2024-03-20 NOTE — TOC Initial Note (Signed)
 Transition of Care [facility]) - Initial/Assessment Note    Patient Details  Name: Jillian Norton MRN: [id number] Date of Birth: May 26, 1941  [facility] CM/SW Contact:    Sheri ONEIDA Sharps, LCSW Phone Number: 03/20/2024, 2:26 PM  Clinical Narrative:                 Pt from [facility]. Pt continues medical workup. TOC following for dc needs.     Barriers to Discharge: Continued Medical Work up   Patient Goals and CMS Choice Patient states their goals for this hospitalization and ongoing recovery are:: return to [employer].gov Compare Post Acute Care list provided to:: Patient Choice offered to / list presented to : Patient [place] ownership interest in [facility].provided to:: Patient    Expected Discharge Plan and Services In-house Referral: NA Discharge Planning Services: NA   Living arrangements for the past 2 months: Independent Living Facility                 DME Arranged: N/A DME Agency: NA       HH Arranged: NA HH Agency: NA        Prior Living Arrangements/Services Living arrangements for the past 2 months: Independent Living Facility Lives with:: Self Patient language and need for interpreter reviewed:: Yes Do you feel safe going back to the place where you live?: Yes      Need for Family Participation in Patient Care: Yes (Comment) Care giver support system in place?: Yes (comment)   Criminal Activity/Legal Involvement Pertinent to Current Situation/Hospitalization: No - Comment as needed  Activities of Daily Living   ADL Screening (condition at time of admission) Independently performs ADLs?: Yes (appropriate for developmental age) Is the patient deaf or have difficulty hearing?: Yes Does the patient have difficulty seeing, even when wearing glasses/contacts?: No Does the patient have difficulty concentrating, remembering, or making decisions?: No  Permission Sought/Granted                  Emotional  Assessment Appearance:: Appears stated age Attitude/Demeanor/Rapport: Engaged Affect (typically observed): Accepting Orientation: : Oriented to Situation, Oriented to  Time, Oriented to Place, Oriented to Self Alcohol  / Substance Use: Not Applicable Psych Involvement: No (comment)  Admission diagnosis:  Postoperative abdominal pain [R10.9, G89.18] Patient Active Problem List   Diagnosis Date Noted   Postoperative abdominal pain 03/19/2024   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 03/03/2024   Arthritis of carpometacarpal (CMC) joint of left thumb 03/05/2018   De Quervain's tenosynovitis, right 03/05/2018   Malignant neoplasm of upper-inner quadrant of right breast in female, estrogen receptor positive (HCC) 10/24/2017   Overweight (BMI 25.0-29.9) 04/07/2017   History of cancer of right breast 04/07/2017   History of DVT (deep vein thrombosis) 04/07/2017   GERD (gastroesophageal reflux disease) 11/01/2016   Primary insomnia 11/01/2016   Urge incontinence 11/01/2016   History of colon polyps 11/01/2016   Postnasal drip 11/01/2016   Shingles 06/27/2016   PCP:  [name], MD Pharmacy:   [facility] [id number] [place], [place] - [address] [place] [address] Phone: [phone] Fax: [phone]  [facility] [address] [place] [address] Phone: [phone] Fax: [phone]     Social Drivers of Health (SDOH) Social History: SDOH Screenings   Food Insecurity: No Food Insecurity (03/19/2024)  Housing: Low Risk  (03/19/2024)  Transportation Needs: No Transportation Needs (03/19/2024)  Utilities: Not  At Risk (03/19/2024)  Social Connections: Moderately Isolated (03/19/2024)  Tobacco Use: Medium Risk (03/19/2024)   SDOH Interventions:     Readmission Risk Interventions    03/20/2024    2:24 PM  Readmission Risk Prevention Plan  Post Dischage Appt Complete  Medication  Screening Complete  Transportation Screening Complete

## 2024-03-21 ENCOUNTER — Inpatient Hospital Stay (HOSPITAL_COMMUNITY)

## 2024-03-21 LAB — CBC
HCT: 31 % — ABNORMAL LOW (ref 36.0–46.0)
Hemoglobin: 9.6 g/dL — ABNORMAL LOW (ref 12.0–15.0)
MCH: 29 pg (ref 26.0–34.0)
MCHC: 31 g/dL (ref 30.0–36.0)
MCV: 93.7 fL (ref 80.0–100.0)
Platelets: 481 K/uL — ABNORMAL HIGH (ref 150–400)
RBC: 3.31 MIL/uL — ABNORMAL LOW (ref 3.87–5.11)
RDW: 13.2 % (ref 11.5–15.5)
WBC: 6.3 K/uL (ref 4.0–10.5)
nRBC: 0 % (ref 0.0–0.2)

## 2024-03-21 LAB — BASIC METABOLIC PANEL WITH GFR
Anion gap: 8 (ref 5–15)
BUN: 15 mg/dL (ref 8–23)
CO2: 27 mmol/L (ref 22–32)
Calcium: 9.1 mg/dL (ref 8.9–10.3)
Chloride: 104 mmol/L (ref 98–111)
Creatinine, Ser: 0.88 mg/dL (ref 0.44–1.00)
GFR, Estimated: >60 mL/min (ref >60)
Glucose, Bld: 85 mg/dL (ref 70–99)
Potassium: 4 mmol/L (ref 3.5–5.1)
Sodium: 139 mmol/L (ref 135–145)

## 2024-03-21 MED ORDER — POLYETHYLENE GLYCOL 3350 17 G PO PACK
17.0000 g | PACK | Freq: Every day | ORAL | Status: DC
  Administered 2024-03-22 – 2024-03-23 (×2): 17 g via ORAL
  Filled 2024-03-21 (×3): qty 1

## 2024-03-21 MED ORDER — POLYETHYLENE GLYCOL 3350 17 G PO PACK
17.0000 g | PACK | Freq: Every day | ORAL | Status: DC | PRN
  Administered 2024-03-21: 17 g via ORAL
  Filled 2024-03-21: qty 1

## 2024-03-21 MED ORDER — BISACODYL 10 MG RE SUPP
10.0000 mg | Freq: Every day | RECTAL | Status: DC | PRN
  Administered 2024-03-22: 10 mg via RECTAL
  Filled 2024-03-21 (×2): qty 1

## 2024-03-21 MED ORDER — POLYETHYLENE GLYCOL 3350 17 G PO PACK: 17.0000 g | PACK | Freq: Two times a day (BID) | ORAL | Status: DC

## 2024-03-21 MED ORDER — ALUM & MAG HYDROXIDE-SIMETH 200-200-20 MG/5ML PO SUSP
30.0000 mL | ORAL | Status: DC | PRN
  Administered 2024-03-21: 30 mL via ORAL
  Filled 2024-03-21: qty 30

## 2024-03-21 MED ORDER — PANTOPRAZOLE SODIUM 20 MG PO TBEC
20.0000 mg | DELAYED_RELEASE_TABLET | Freq: Every day | ORAL | Status: DC
  Administered 2024-03-21 – 2024-03-24 (×4): 20 mg via ORAL
  Filled 2024-03-21 (×4): qty 1

## 2024-03-21 NOTE — Progress Notes (Signed)
 2 Days Post-Op Subjective: Afebrile, vital signs stable, labs stable. C/o pain around incision and LLQ. Denies nausea, emesis. Afebrile.  Objective: Vital signs in last 24 hours: Temp:  [97.5 F (36.4 C)-98.1 F (36.7 C)] 98 F (36.7 C) (07/26 0541) Pulse Rate:  [63-73] 63 (07/26 0541) Resp:  [18-20] 18 (07/26 0541) BP: (100-127)/(47-65) 103/58 (07/26 0541) SpO2:  [98 %-100 %] 100 % (07/26 0541)  Intake/Output from previous day: 07/25 0701 - 07/26 0700 In: 685.1 [P.O.:360; IV Piggyback:325.1] Out: 865 [Urine:850; Drains:15] Intake/Output this shift: Total I/O In: 685.1 [P.O.:360; IV Piggyback:325.1] Out: -  UOP: 2.3L JP: 30ml serous  Physical Exam:  General: Alert and oriented CV: RRR Lungs: Clear Abdomen: Soft, ND, tender around incision, no fluctuance, mild ecchymosis. JP with scant serosanginous drainage Ext: NT, No erythema  Lab Results: Recent Labs    03/19/24 0130 03/19/24 0531 03/20/24 0930  HGB 10.2* 10.1* 10.4*  HCT 32.2* 31.7* 32.1*   BMET Recent Labs    03/19/24 0531 03/20/24 0930  NA 134* 138  K 3.7 4.1  CL 101 101  CO2 24 28  GLUCOSE 97 167*  BUN 12 13  CREATININE 0.68 0.71  CALCIUM 8.7* 9.2     Studies/Results: No results found.   Assessment/Plan: Bladder injury s/p complex cystorrhaphy 03/19/2024 Incarcerated femoral hernia s/p SBR and repair on 03/03/2024  -Pain control prn. Gabapentin  added by primary. -Okay for drain to be removed -Labs appropriate -Keep foley for 2 weeks and will arrange for cystogram in our office prior to removal   LOS: 2 days

## 2024-03-21 NOTE — Evaluation (Signed)
 Physical Therapy Evaluation Patient Details Name: Jillian Norton MRN: [id number] DOB: Jan 26, 1941 Today's Date: 03/21/2024  History of Present Illness  Jillian Norton is an 83 yo F s/p femoral hernia repair and small bowel resection 7/8 VB complicated by bladder injury seen on CT 7/23, now s/p cystorrhaphy PMH: femoral hernia, De Quervain's R, OA L thumb, DVT, R breast Ca, GERD, shingles  Clinical Impression  Pt admitted with above diagnosis. Reviewed technique for log roll for bed mobility. Pt ambulated 50' with RW in the room, then sat in a recliner for a few minutes but was not comfortable so returned to bed. Pt reported 8/10 pain with activity, pain medication was administered prior to PT session. Good progress expected as pain eases.  Pt currently with functional limitations due to the deficits listed below (see PT Problem List). Pt will benefit from acute skilled PT to increase their independence and safety with mobility to allow discharge.           If plan is discharge home, recommend the following: A little help with walking and/or transfers;A little help with bathing/dressing/bathroom;Help with stairs or ramp for entrance;Assist for transportation   Can travel by private vehicle   Yes    Equipment Recommendations Rolling walker (2 wheels)  Recommendations for Other Services       Functional Status Assessment Patient has had a recent decline in their functional status and demonstrates the ability to make significant improvements in function in a reasonable and predictable amount of time.     Precautions / Restrictions Precautions Precautions: Fall;Other (comment) Recall of Precautions/Restrictions: Intact Precaution/Restrictions Comments: abdominal surgery      Mobility  Bed Mobility   Bed Mobility: Rolling, Sidelying to Sit, Sit to Sidelying Rolling: Min assist, Used rails Sidelying to sit: Used rails, Supervision     Sit to sidelying: Min assist General bed  mobility comments: VCs for log roll technique, min A for BLEs back into bed, increased time    Transfers Overall transfer level: Needs assistance Equipment used: Rolling walker (2 wheels) Transfers: Sit to/from Stand Sit to Stand: Contact guard assist           General transfer comment: VCs hand placement, increased time    Ambulation/Gait Ambulation/Gait assistance: Contact guard assist Gait Distance (Feet): 50 Feet Assistive device: Rolling walker (2 wheels) Gait Pattern/deviations: Step-through pattern, Decreased stride length Gait velocity: decr     General Gait Details: slow but steady with RW, distance limited by pain; pt ambulated in room, then sat in recliner for a few minutes but was uncomfortable so returned to bed  Stairs            Wheelchair Mobility     Tilt Bed    Modified Rankin (Stroke Patients Only)       Balance Overall balance assessment: Needs assistance   Sitting balance-Leahy Scale: Good     Standing balance support: Bilateral upper extremity supported, Reliant on assistive device for balance, During functional activity Standing balance-Leahy Scale: Fair                               Pertinent Vitals/Pain Pain Assessment Pain Score: 8  Pain Location: L sided abdominal incision with movement Pain Descriptors / Indicators: Stabbing Pain Intervention(s): Limited activity within patient's tolerance, Monitored during session, Premedicated before session, Repositioned    Home Living Family/patient expects to be discharged to:: Private residence Living Arrangements: Alone Available Help  at Discharge: Neighbor;Friend(s);Available PRN/intermittently Type of Home: Apartment Home Access: Level entry       Home Layout: One level Home Equipment: None      Prior Function Prior Level of Function : Independent/Modified Independent             Mobility Comments: Independent without AD prior to admission. Active golfer  at [employer] ADLs Comments: Independent     Extremity/Trunk Assessment   Upper Extremity Assessment Upper Extremity Assessment: Overall WFL for tasks assessed    Lower Extremity Assessment Lower Extremity Assessment: Overall WFL for tasks assessed    Cervical / Trunk Assessment Cervical / Trunk Assessment: Normal  Communication   Communication Communication: Impaired Factors Affecting Communication: Hearing impaired    Cognition Arousal: Alert Behavior During Therapy: WFL for tasks assessed/performed   PT - Cognitive impairments: No apparent impairments                         Following commands: Intact       Cueing Cueing Techniques: Verbal cues     General Comments      Exercises     Assessment/Plan    PT Assessment Patient needs continued PT services  PT Problem List Decreased strength;Decreased activity tolerance;Decreased balance;Decreased mobility;Pain       PT Treatment Interventions DME instruction;Gait training;Functional mobility training;Therapeutic activities;Patient/family education;Neuromuscular re-education;Therapeutic exercise    PT Goals (Current goals can be found in the Care Plan section)  Acute Rehab PT Goals Patient Stated Goal: return to golf and swimming, go to granddaughter's wedding in [place] in Sept PT Goal Formulation: With patient Time For Goal Achievement: 04/04/24 Potential to Achieve Goals: Good    Frequency Min 3X/week     Co-evaluation               AM-PAC PT 6 Clicks Mobility  Outcome Measure Help needed turning from your back to your side while in a flat bed without using bedrails?: A Little Help needed moving from lying on your back to sitting on the side of a flat bed without using bedrails?: A Little Help needed moving to and from a bed to a chair (including a wheelchair)?: A Little Help needed standing up from a chair using your arms (e.g., wheelchair or bedside chair)?: A Little Help  needed to walk in hospital room?: A Little Help needed climbing 3-5 steps with a railing? : A Little 6 Click Score: 18    End of Session   Activity Tolerance: Patient limited by pain Patient left: with call bell/phone within reach;in bed Nurse Communication: Mobility status PT Visit Diagnosis: Unsteadiness on feet (R26.81);Other abnormalities of gait and mobility (R26.89);Muscle weakness (generalized) (M62.81);Pain    Time: 0935-1003 PT Time Calculation (min) (ACUTE ONLY): 28 min   Charges:   PT Evaluation $PT Eval Moderate Complexity: 1 Mod PT Treatments $Gait Training: 8-22 mins PT General Charges $$ ACUTE PT VISIT: 1 Visit         Sylvan Delon Copp PT 03/21/2024  Acute Rehabilitation Services  Office [phone]

## 2024-03-21 NOTE — Progress Notes (Signed)
   Progress Note  2 Days Post-Op  Interval: POD 2 from cystorrhaphy. Pain improved. Passing flatus. Does not have much appetite but no nausea. Would like additional miralax  dose daily.   Objective: Vital signs in last 24 hours: Temp:  [97.5 F (36.4 C)-98.1 F (36.7 C)] 98 F (36.7 C) (07/26 0541) Pulse Rate:  [63-73] 63 (07/26 0541) Resp:  [18-20] 18 (07/26 0541) BP: (100-127)/(47-65) 103/58 (07/26 0541) SpO2:  [98 %-100 %] 100 % (07/26 0541) Last BM Date : 03/18/24  Intake/Output from previous day: 07/25 0701 - 07/26 0700 In: 1045.1 [P.O.:720; IV Piggyback:325.1] Out: 1770 [Urine:1750; Drains:20] Intake/Output this shift: Total I/O In: 75 [IV Piggyback:75] Out: -   PE: General: NAD, pleasant female CV: RRR Pulm: NWOB on room air Abd: Soft, tender to palpation along surgical incision, drain SS with 20cc over last 24h GU: foley in place   Lab Results:  Recent Labs    03/20/24 0930 03/21/24 0527  WBC 8.5 6.3  HGB 10.4* 9.6*  HCT 32.1* 31.0*  PLT 499* 481*   BMET Recent Labs    03/20/24 0930 03/21/24 0527  NA 138 139  K 4.1 4.0  CL 101 104  CO2 28 27  GLUCOSE 167* 85  BUN 13 15  CREATININE 0.71 0.88  CALCIUM 9.2 9.1   PT/INR No results for input(s): LABPROT, INR in the last 72 hours. CMP     Component Value Date/Time   NA 139 03/21/2024 0527   NA 139 11/11/2015 0000   K 4.0 03/21/2024 0527   CL 104 03/21/2024 0527   CO2 27 03/21/2024 0527   GLUCOSE 85 03/21/2024 0527   BUN 15 03/21/2024 0527   BUN 20 11/11/2015 0000   CREATININE 0.88 03/21/2024 0527   CREATININE 0.88 05/27/2017 1417   CALCIUM 9.1 03/21/2024 0527   PROT 6.8 03/18/2024 2210   ALBUMIN 3.0 (L) 03/18/2024 2210   AST 12 (L) 03/18/2024 2210   ALT 17 03/18/2024 2210   ALKPHOS 65 03/18/2024 2210   BILITOT 0.7 03/18/2024 2210   GFRNONAA >60 03/21/2024 0527   GFRNONAA 64 05/27/2017 1417   GFRAA 74 05/27/2017 1417   Lipase     Component Value Date/Time   LIPASE 31  03/18/2024 2210       Studies/Results: No results found.    Assessment/Plan 83 yo F s/p femoral hernia repair and small bowel resection 7/8 VB complicated by bladder injury seen on CT 7/23, now s/p cystorrhaphy  LOS: 2 days   - Regular diet - Multimodal pain control - Will schedule miralax  and add PRN dose - Discontinue JP drain, discussed with urology - Will discontinue antibiotics, discussed with urology and no indication from their perspective. Additionally, normal white count, no signs of infection - PT/OT, mobilize, OOB - Continue foley for 14 days, plan for cystogram prior to foley removal - lovenox  for DVT ppx - Dispo: med-surg  I reviewed Consultant urology notes, last 24 h vitals and pain scores, last 48 h intake and output, last 24 h labs and trends, and last 24 h imaging results.  This care required moderate level of medical decision making.    Richerd Silversmith, MD [facility] Surgery 03/21/2024, 9:15 AM Please see Amion for pager number during day hours 7:00am-4:30pm

## 2024-03-21 NOTE — Plan of Care (Signed)

## 2024-03-22 LAB — BASIC METABOLIC PANEL WITH GFR
Anion gap: 6 (ref 5–15)
BUN: 16 mg/dL (ref 8–23)
CO2: 28 mmol/L (ref 22–32)
Calcium: 9.1 mg/dL (ref 8.9–10.3)
Chloride: 103 mmol/L (ref 98–111)
Creatinine, Ser: 0.75 mg/dL (ref 0.44–1.00)
GFR, Estimated: >60 mL/min (ref >60)
Glucose, Bld: 89 mg/dL (ref 70–99)
Potassium: 4.5 mmol/L (ref 3.5–5.1)
Sodium: 137 mmol/L (ref 135–145)

## 2024-03-22 LAB — TYPE AND SCREEN
ABO/RH(D): A NEG
Antibody Screen: NEGATIVE
Unit division: 0

## 2024-03-22 LAB — PHOSPHORUS: Phosphorus: 3.8 mg/dL (ref 2.5–4.6)

## 2024-03-22 LAB — BPAM RBC
Blood Product Expiration Date: 202508272359
Unit Type and Rh: 600

## 2024-03-22 LAB — MAGNESIUM: Magnesium: 2.3 mg/dL (ref 1.7–2.4)

## 2024-03-22 LAB — CBC
HCT: 32.9 % — ABNORMAL LOW (ref 36.0–46.0)
Hemoglobin: 10.5 g/dL — ABNORMAL LOW (ref 12.0–15.0)
MCH: 29.8 pg (ref 26.0–34.0)
MCHC: 31.9 g/dL (ref 30.0–36.0)
MCV: 93.5 fL (ref 80.0–100.0)
Platelets: 458 K/uL — ABNORMAL HIGH (ref 150–400)
RBC: 3.52 MIL/uL — ABNORMAL LOW (ref 3.87–5.11)
RDW: 13.4 % (ref 11.5–15.5)
WBC: 6.1 K/uL (ref 4.0–10.5)
nRBC: 0 % (ref 0.0–0.2)

## 2024-03-22 MED ORDER — CARMEX CLASSIC LIP BALM EX OINT
TOPICAL_OINTMENT | CUTANEOUS | Status: DC | PRN
  Filled 2024-03-22: qty 10

## 2024-03-22 MED ORDER — SENNOSIDES-DOCUSATE SODIUM 8.6-50 MG PO TABS
1.0000 | ORAL_TABLET | Freq: Two times a day (BID) | ORAL | Status: DC
  Administered 2024-03-22 – 2024-03-24 (×5): 1 via ORAL
  Filled 2024-03-22 (×5): qty 1

## 2024-03-22 MED ORDER — IBUPROFEN 200 MG PO TABS: 600.0000 mg | ORAL_TABLET | Freq: Four times a day (QID) | ORAL | Status: DC | PRN

## 2024-03-22 NOTE — Plan of Care (Signed)
 Patient due to discharge in AM, plan of care and goals reviewed, patient handbook/guide at bedside, bed in lowest locked position, with side rails up bed alarm on and call bell in reach.  Problem: Education: Goal: Knowledge of General Education information will improve Description: Including pain rating scale, medication(s)/side effects and non-pharmacologic comfort measures Outcome: Progressing   Problem: Health Behavior/Discharge Planning: Goal: Ability to manage health-related needs will improve Outcome: Progressing   Problem: Clinical Measurements: Goal: Ability to maintain clinical measurements within normal limits will improve Outcome: Progressing Goal: Will remain free from infection Outcome: Progressing Goal: Diagnostic test results will improve Outcome: Progressing Goal: Respiratory complications will improve Outcome: Progressing Goal: Cardiovascular complication will be avoided Outcome: Progressing   Problem: Activity: Goal: Risk for activity intolerance will decrease Outcome: Progressing   Problem: Nutrition: Goal: Adequate nutrition will be maintained Outcome: Progressing   Problem: Coping: Goal: Level of anxiety will decrease Outcome: Progressing   Problem: Elimination: Goal: Will not experience complications related to bowel motility Outcome: Progressing Goal: Will not experience complications related to urinary retention Outcome: Progressing   Problem: Pain Managment: Goal: General experience of comfort will improve and/or be controlled Outcome: Progressing   Problem: Safety: Goal: Ability to remain free from injury will improve Outcome: Progressing   Problem: Skin Integrity: Goal: Risk for impaired skin integrity will decrease Outcome: Progressing

## 2024-03-22 NOTE — Plan of Care (Signed)

## 2024-03-22 NOTE — Progress Notes (Signed)
 3 Days Post-Op Subjective: Afebrile, vital signs stable, labs stable. Appropriate UoP. Denies nausea, emesis.  Objective: Vital signs in last 24 hours: Temp:  [98 F (36.7 C)-98.2 F (36.8 C)] 98.1 F (36.7 C) (07/27 0411) Pulse Rate:  [66-88] 66 (07/27 0411) Resp:  [16-18] 16 (07/27 0411) BP: (96-124)/(64-74) 119/64 (07/27 0411) SpO2:  [100 %] 100 % (07/27 0411)  Intake/Output from previous day: 07/26 0701 - 07/27 0700 In: 555 [P.O.:480; IV Piggyback:75] Out: 1225 [Urine:1200; Drains:25] Intake/Output this shift: Total I/O In: -  Out: 350 [Urine:350] UOP: 2.3L JP: 30ml serous  Physical Exam:  General: Alert and oriented CV: RRR Lungs: Clear Abdomen: Soft, ND, tender around incision, no fluctuance, mild ecchymosis. JP with scant serosanginous drainage Ext: NT, No erythema  Lab Results: Recent Labs    03/20/24 0930 03/21/24 0527 03/22/24 0536  HGB 10.4* 9.6* 10.5*  HCT 32.1* 31.0* 32.9*   BMET Recent Labs    03/20/24 0930 03/21/24 0527  NA 138 139  K 4.1 4.0  CL 101 104  CO2 28 27  GLUCOSE 167* 85  BUN 13 15  CREATININE 0.71 0.88  CALCIUM 9.2 9.1     Studies/Results: DG Abd Portable 1V Result Date: 03/21/2024 CLINICAL DATA:  Two days postop abdominal surgery with abdominal pain, initial encounter EXAM: PORTABLE ABDOMEN - 1 VIEW COMPARISON:  03/05/2024 FINDINGS: Scattered large and small bowel gas is noted. Scattered fecal material is noted. No constipation or obstructive changes are seen. No free air is noted. Degenerative changes of lumbar spine are seen. IMPRESSION: No obstructive changes are noted.  No free air is seen. Electronically Signed   By: Oneil Devonshire M.D.   On: 03/21/2024 22:43     Assessment/Plan: Bladder injury s/p complex cystorrhaphy 03/19/2024 Incarcerated femoral hernia s/p SBR and repair on 03/03/2024  -Pain control prn -Labs appropriate -Recommend foley and leg bag teaching -Keep foley for 2 weeks and will arrange for cystogram  in our office prior to removal   LOS: 3 days

## 2024-03-22 NOTE — Progress Notes (Signed)
 Mobility Specialist - Progress Note   03/22/24 1000  Mobility  Activity Ambulated with assistance in hallway;Ambulated with assistance to bathroom  Level of Assistance Standby assist, set-up cues, supervision of patient - no hands on  Assistive Device Front wheel walker  Distance Ambulated (ft) 500 ft  Range of Motion/Exercises Active  Activity Response Tolerated well  Mobility Referral Yes  Mobility visit 1 Mobility  Mobility Specialist Start Time (ACUTE ONLY) 1000  Mobility Specialist Stop Time (ACUTE ONLY) 1017  Mobility Specialist Time Calculation (min) (ACUTE ONLY) 17 min   Pt was found in bed and agreeable to ambulate. No complaints. At EOS returned to bed with all needs met. Call bell in reach and LPN in room.  Erminio Leos,  Mobility Specialist Can be reached via Secure Chat

## 2024-03-22 NOTE — Progress Notes (Signed)
 Progress Note  3 Days Post-Op  Interval: POD 3 from cystorrhaphy. Pain stable. Passing flatus. Some bloating/distension. KUB without signs of obstruction.   Objective: Vital signs in last 24 hours: Temp:  [98 F (36.7 C)-98.2 F (36.8 C)] 98.1 F (36.7 C) (07/27 0411) Pulse Rate:  [66-88] 66 (07/27 0411) Resp:  [16-18] 16 (07/27 0411) BP: (96-124)/(64-74) 119/64 (07/27 0411) SpO2:  [100 %] 100 % (07/27 0411) Last BM Date : 03/18/24  Intake/Output from previous day: 07/26 0701 - 07/27 0700 In: 555 [P.O.:480; IV Piggyback:75] Out: 1225 [Urine:1200; Drains:25] Intake/Output this shift: No intake/output data recorded.  PE: General: NAD, pleasant female CV: RRR Pulm: NWOB on room air Abd: Soft, tender to palpation along surgical incision, no signs of infection GU: foley in place   Lab Results:  Recent Labs    03/21/24 0527 03/22/24 0536  WBC 6.3 6.1  HGB 9.6* 10.5*  HCT 31.0* 32.9*  PLT 481* 458*   BMET Recent Labs    03/21/24 0527 03/22/24 0536  NA 139 137  K 4.0 4.5  CL 104 103  CO2 27 28  GLUCOSE 85 89  BUN 15 16  CREATININE 0.88 0.75  CALCIUM 9.1 9.1   PT/INR No results for input(s): LABPROT, INR in the last 72 hours. CMP     Component Value Date/Time   NA 137 03/22/2024 0536   NA 139 11/11/2015 0000   K 4.5 03/22/2024 0536   CL 103 03/22/2024 0536   CO2 28 03/22/2024 0536   GLUCOSE 89 03/22/2024 0536   BUN 16 03/22/2024 0536   BUN 20 11/11/2015 0000   CREATININE 0.75 03/22/2024 0536   CREATININE 0.88 05/27/2017 1417   CALCIUM 9.1 03/22/2024 0536   PROT 6.8 03/18/2024 2210   ALBUMIN 3.0 (L) 03/18/2024 2210   AST 12 (L) 03/18/2024 2210   ALT 17 03/18/2024 2210   ALKPHOS 65 03/18/2024 2210   BILITOT 0.7 03/18/2024 2210   GFRNONAA >60 03/22/2024 0536   GFRNONAA 64 05/27/2017 1417   GFRAA 74 05/27/2017 1417   Lipase     Component Value Date/Time   LIPASE 31 03/18/2024 2210       Studies/Results: DG Abd Portable  1V Result Date: 03/21/2024 CLINICAL DATA:  Two days postop abdominal surgery with abdominal pain, initial encounter EXAM: PORTABLE ABDOMEN - 1 VIEW COMPARISON:  03/05/2024 FINDINGS: Scattered large and small bowel gas is noted. Scattered fecal material is noted. No constipation or obstructive changes are seen. No free air is noted. Degenerative changes of lumbar spine are seen. IMPRESSION: No obstructive changes are noted.  No free air is seen. Electronically Signed   By: Oneil Devonshire M.D.   On: 03/21/2024 22:43      Assessment/Plan 83 yo F s/p femoral hernia repair and small bowel resection 7/8 VB complicated by bladder injury seen on CT 7/23, now s/p cystorrhaphy  LOS: 3 days   - CLD started yesterday due to bloating. Will continue for now given still feels somewhat bloated and minimal flatus with no BM. - Multimodal pain control - Continue miralax , suppository, senna - Will check mag and phos this AM - PT/OT, mobilize, OOB - Continue foley for 14 days, plan for cystogram prior to foley removal - lovenox  for DVT ppx - Dispo: med-surg  I reviewed Consultant urology notes, last 24 h vitals and pain scores, last 48 h intake and output, last 24 h labs and trends, and last 24 h imaging results.  This care required moderate  level of medical decision making.    Richerd Silversmith, MD [facility] Surgery 03/22/2024, 9:35 AM Please see Amion for pager number during day hours 7:00am-4:30pm

## 2024-03-23 LAB — BASIC METABOLIC PANEL WITH GFR
Anion gap: 9 (ref 5–15)
BUN: 17 mg/dL (ref 8–23)
CO2: 25 mmol/L (ref 22–32)
Calcium: 9.1 mg/dL (ref 8.9–10.3)
Chloride: 105 mmol/L (ref 98–111)
Creatinine, Ser: 0.63 mg/dL (ref 0.44–1.00)
GFR, Estimated: >60 mL/min (ref >60)
Glucose, Bld: 98 mg/dL (ref 70–99)
Potassium: 4.5 mmol/L (ref 3.5–5.1)
Sodium: 139 mmol/L (ref 135–145)

## 2024-03-23 LAB — CBC
HCT: 34.3 % — ABNORMAL LOW (ref 36.0–46.0)
Hemoglobin: 11.1 g/dL — ABNORMAL LOW (ref 12.0–15.0)
MCH: 30.3 pg (ref 26.0–34.0)
MCHC: 32.4 g/dL (ref 30.0–36.0)
MCV: 93.7 fL (ref 80.0–100.0)
Platelets: 456 K/uL — ABNORMAL HIGH (ref 150–400)
RBC: 3.66 MIL/uL — ABNORMAL LOW (ref 3.87–5.11)
RDW: 13.2 % (ref 11.5–15.5)
WBC: 6.1 K/uL (ref 4.0–10.5)
nRBC: 0 % (ref 0.0–0.2)

## 2024-03-23 MED ORDER — GABAPENTIN 300 MG PO CAPS
300.0000 mg | ORAL_CAPSULE | Freq: Three times a day (TID) | ORAL | Status: DC
  Administered 2024-03-23 – 2024-03-24 (×3): 300 mg via ORAL
  Filled 2024-03-23 (×3): qty 1

## 2024-03-23 MED ORDER — IBUPROFEN 800 MG PO TABS
800.0000 mg | ORAL_TABLET | Freq: Three times a day (TID) | ORAL | Status: DC
  Administered 2024-03-23 – 2024-03-24 (×3): 800 mg via ORAL
  Filled 2024-03-23 (×3): qty 1

## 2024-03-23 MED ORDER — METHOCARBAMOL 500 MG PO TABS
500.0000 mg | ORAL_TABLET | Freq: Four times a day (QID) | ORAL | Status: DC
  Administered 2024-03-23 – 2024-03-24 (×5): 500 mg via ORAL
  Filled 2024-03-23 (×5): qty 1

## 2024-03-23 MED ORDER — ORAL CARE MOUTH RINSE: 15.0000 mL | OROMUCOSAL | Status: DC | PRN

## 2024-03-23 NOTE — Plan of Care (Signed)
  Problem: Clinical Measurements: Goal: Ability to maintain clinical measurements within normal limits will improve Outcome: Progressing Goal: Diagnostic test results will improve Outcome: Progressing   Problem: Nutrition: Goal: Adequate nutrition will be maintained Outcome: Progressing   Problem: Coping: Goal: Level of anxiety will decrease Outcome: Progressing   Problem: Pain Managment: Goal: General experience of comfort will improve and/or be controlled Outcome: Progressing   Problem: Safety: Goal: Ability to remain free from injury will improve Outcome: Progressing   Problem: Skin Integrity: Goal: Risk for impaired skin integrity will decrease Outcome: Progressing

## 2024-03-23 NOTE — Progress Notes (Signed)
 PT Cancellation Note  Patient Details Name: Jillian Norton MRN: [id number] DOB: [birth year]   Cancelled Treatment:    Reason Eval/Treat Not Completed: Other (comment). Pt reported she was not sure if something was going on with her surgical incision line and did not want to get OOB until she was aware of the cause. Pt indicated increasing abdominal pain as well 5/10. Pt is to transition to SNF at [facility] for continued therapy services prior to returning to [facility]. PT to continue to follow acutely.   [place], PT Acute Rehab   [place] VEAR Drone 03/23/2024, 11:52 AM

## 2024-03-23 NOTE — Progress Notes (Signed)
 Progress Note  4 Days Post-Op  Subjective: Patient reports pain of the lower abdomen that is sharp, deep to surgical incision. She had a bowel movement this morning that she felt was well formed. Denies hematochezia. Patient reports flatulence. Tolerated diet. Denies nausea, vomiting.  ROS  All negative with the exception of above.  Objective: Vital signs in last 24 hours: Temp:  [97.4 F (36.3 C)-97.8 F (36.6 C)] 97.8 F (36.6 C) (07/28 0513) Pulse Rate:  [72-101] 72 (07/28 0513) Resp:  [18] 18 (07/28 0513) BP: (112-151)/(55-65) 151/65 (07/28 0513) SpO2:  [94 %] 94 % (07/27 1255) Last BM Date : 03/23/24  Intake/Output from previous day: 07/27 0701 - 07/28 0700 In: 960 [P.O.:960] Out: 2200 [Urine:2200] Intake/Output this shift: Total I/O In: 500 [P.O.:500] Out: 250 [Urine:250]  PE: General: Pleasant, WD, female who is laying in bed in NAD. Heart: Regular, rate, and rhythm. Lungs: Respiratory effort nonlabored Abd: Soft and nondistended. Tenderness to palpation of area around abdominal incision. Incision C/D/I.  GU: Foley catheter in place draining yellow clear urine in bag. Psych: A&Ox3 with an appropriate affect.    Lab Results:  Recent Labs    03/22/24 0536 03/23/24 0510  WBC 6.1 6.1  HGB 10.5* 11.1*  HCT 32.9* 34.3*  PLT 458* 456*   BMET Recent Labs    03/22/24 0536 03/23/24 0510  NA 137 139  K 4.5 4.5  CL 103 105  CO2 28 25  GLUCOSE 89 98  BUN 16 17  CREATININE 0.75 0.63  CALCIUM 9.1 9.1   PT/INR No results for input(s): LABPROT, INR in the last 72 hours. CMP     Component Value Date/Time   NA 139 03/23/2024 0510   NA 139 11/11/2015 0000   K 4.5 03/23/2024 0510   CL 105 03/23/2024 0510   CO2 25 03/23/2024 0510   GLUCOSE 98 03/23/2024 0510   BUN 17 03/23/2024 0510   BUN 20 11/11/2015 0000   CREATININE 0.63 03/23/2024 0510   CREATININE 0.88 05/27/2017 1417   CALCIUM 9.1 03/23/2024 0510   PROT 6.8 03/18/2024 2210   ALBUMIN  3.0 (L) 03/18/2024 2210   AST 12 (L) 03/18/2024 2210   ALT 17 03/18/2024 2210   ALKPHOS 65 03/18/2024 2210   BILITOT 0.7 03/18/2024 2210   GFRNONAA >60 03/23/2024 0510   GFRNONAA 64 05/27/2017 1417   GFRAA 74 05/27/2017 1417   Lipase     Component Value Date/Time   LIPASE 31 03/18/2024 2210       Studies/Results: DG Abd Portable 1V Result Date: 03/21/2024 CLINICAL DATA:  Two days postop abdominal surgery with abdominal pain, initial encounter EXAM: PORTABLE ABDOMEN - 1 VIEW COMPARISON:  03/05/2024 FINDINGS: Scattered large and small bowel gas is noted. Scattered fecal material is noted. No constipation or obstructive changes are seen. No free air is noted. Degenerative changes of lumbar spine are seen. IMPRESSION: No obstructive changes are noted.  No free air is seen. Electronically Signed   By: Oneil Devonshire M.D.   On: 03/21/2024 22:43    Anti-infectives: Anti-infectives (From admission, onward)    Start     Dose/Rate Route Frequency Ordered Stop   03/19/24 0815  cefTRIAXone  (ROCEPHIN ) 2 g in sodium chloride  0.9 % 100 mL IVPB  Status:  Discontinued       Note to Pharmacy: Pharmacy may adjust dosing strength / duration / interval for maximal efficacy   2 g 200 mL/hr over 30 Minutes Intravenous On call to O.R. 03/19/24  [id number] 03/19/24 0723   03/19/24 0145  ciprofloxacin  (CIPRO ) IVPB 400 mg  Status:  Discontinued        400 mg 200 mL/hr over 60 Minutes Intravenous Every 12 hours 03/19/24 0136 03/21/24 0915   03/18/24 2245  cefTRIAXone  (ROCEPHIN ) 1 g in sodium chloride  0.9 % 100 mL IVPB        1 g 200 mL/hr over 30 Minutes Intravenous  Once 03/18/24 2241 03/18/24 2340        Assessment/Plan S/p femoral hernia repair and small bowel resection 7/8 with Dr. Richerd Silversmith complicated by bladder injury seen on CT 7/23. S/p cystorrhaphy 03/19/2024. -Increased pain on abdominal exam deep to incision site. No concerns of infection surrounding incision. Will optimize pain control.  Increased Robaxin  to 4 times daily. Will plan to reassess.  -WBC 6.1 -BMP WNL -Continue regular diet -Continue PT/OT as tolerated. -Foley to be continued for 14 days post op. Cystogram to be completed prior to foley catheter removal.   FEN: Regular diet VTE: SCDs, Enoxaparin  injection ID: None    LOS: 4 days   I reviewed urology notes, last 24 h vitals and pain scores, last 48 h intake and output, last 24 h labs and trends, last 24 h imaging results, and nursing notes.   Marjorie Carlyon Favre, [facility] Surgery 03/23/2024, 10:27 AM Please see Amion for pager number during day hours 7:00am-4:30pm

## 2024-03-23 NOTE — Progress Notes (Signed)
 OT Cancellation Note  Patient Details Name: Jillian Norton MRN: [id number] DOB: June 15, 1941   Cancelled Treatment:    Reason Eval/Treat Not Completed: Pain limiting ability to participate. Pt apologetically declined OT today due to a sudden return of post-surgical pain to abdomen. Pt icing area in bed. Pt rated pain as 4.5/10 and progressively worsening. Pt asked to hold off on ADL training for today. RN notified.  Pt did ask for SNF at [facility] prior to home, and for OT to return tomorrow.   Delon Falter 03/23/2024, 10:37 AM

## 2024-03-23 NOTE — TOC Progression Note (Addendum)
 Transition of Care [facility]) - Progression Note    Patient Details  Name: Jillian Norton MRN: [id number] Date of Birth: [birth year]  Transition of Care [facility]) CM/SW Contact  Heather DELENA Saltness, LCSW Phone Number: 03/23/2024, 2:11 PM  Clinical Narrative:     ADDENDUM  4:30 PM - CSW spoke with pt's daughter, Camie Picket [phone], via phone call to discuss discharge planning. Per daughter pt has RW at home already. Daughter reports pt wants to go to short-term rehab at [facility] upon discharge. CSW advised daughter that PT recommended HH PT on 7/26, however PT was unable to assess pt today due to pain. CSW will await PT evaluation tomorrow.  4:14 PM - CSW spoke with [place] from [place] via phone call. Per Oddis Paddock uses [employer] as contracted HH agency.    Pt from [facility] ILF. Pt recommended for [facility] PT/OT upon discharge. CSW attempted to speak with Oddis Hales [phone], social worker, at [place] to confirm discharge plan and set up [facility] services. Unable to reach [place]. Voicemail left. TOC will continue to follow.     Barriers to Discharge: Continued Medical Work up              Expected Discharge Plan and Services In-house Referral: NA Discharge Planning Services: NA   Living arrangements for the past 2 months: Independent Living Facility                 DME Arranged: N/A DME Agency: NA       HH Arranged: NA HH Agency: NA         Social Drivers of Health (SDOH) Interventions SDOH Screenings   Food Insecurity: No Food Insecurity (03/19/2024)  Housing: Low Risk  (03/19/2024)  Transportation Needs: No Transportation Needs (03/19/2024)  Utilities: Not At Risk (03/19/2024)  Social Connections: Moderately Isolated (03/19/2024)  Tobacco Use: Medium Risk (03/19/2024)    Readmission Risk Interventions    03/20/2024    2:24 PM  Readmission Risk Prevention Plan  Post Dischage Appt Complete  Medication Screening Complete  Transportation  Screening Complete    Heather Saltness, MSW, LCSW Clinical Social Worker Inpatient Care Management 03/23/2024 2:13 PM

## 2024-03-23 NOTE — Progress Notes (Signed)
 4 Days Post-Op Subjective: Doing well, had BM yesterday, complains mainly of some gas pain. No N/V/F/C.   Objective: Vital signs in last 24 hours: Temp:  [97.4 F (36.3 C)-97.8 F (36.6 C)] 97.8 F (36.6 C) (07/28 0513) Pulse Rate:  [72-101] 72 (07/28 0513) Resp:  [18] 18 (07/28 0513) BP: (112-151)/(55-65) 151/65 (07/28 0513) SpO2:  [94 %] 94 % (07/27 1255)  Intake/Output from previous day: 07/27 0701 - 07/28 0700 In: 960 [P.O.:960] Out: 2200 [Urine:2200] Intake/Output this shift: No intake/output data recorded.  Physical Exam:  General: Alert and oriented CV: RRR Lungs: Clear Abdomen: Soft, ND, ATTP; Pfannenstiel incision C/L/I, drain sight healing no erythema or drainage.  GU: catheter in place with clear yellow urine.   Lab Results: Recent Labs    03/21/24 0527 03/22/24 0536 03/23/24 0510  HGB 9.6* 10.5* 11.1*  HCT 31.0* 32.9* 34.3*   BMET Recent Labs    03/22/24 0536 03/23/24 0510  NA 137 139  K 4.5 4.5  CL 103 105  CO2 28 25  GLUCOSE 89 98  BUN 16 17  CREATININE 0.75 0.63  CALCIUM 9.1 9.1     Studies/Results: DG Abd Portable 1V Result Date: 03/21/2024 CLINICAL DATA:  Two days postop abdominal surgery with abdominal pain, initial encounter EXAM: PORTABLE ABDOMEN - 1 VIEW COMPARISON:  03/05/2024 FINDINGS: Scattered large and small bowel gas is noted. Scattered fecal material is noted. No constipation or obstructive changes are seen. No free air is noted. Degenerative changes of lumbar spine are seen. IMPRESSION: No obstructive changes are noted.  No free air is seen. Electronically Signed   By: Oneil Devonshire M.D.   On: 03/21/2024 22:43    Assessment/Plan: Bladder injury s/p complex cystorrhaphy 03/19/2024 Incarcerated femoral hernia s/p SBR and repair on 03/03/2024   -Pain control prn -Labs appropriate -Recommend foley and leg bag teaching -Keep foley for 2 weeks and will arrange for cystogram in our office prior to removal f/u has been requested  -  ok to DC from urology standpoint    LOS: 4 days   Jackey Pea MD 03/23/2024, 7:18 AM [facility]

## 2024-03-24 ENCOUNTER — Inpatient Hospital Stay (HOSPITAL_COMMUNITY)

## 2024-03-24 ENCOUNTER — Other Ambulatory Visit (HOSPITAL_BASED_OUTPATIENT_CLINIC_OR_DEPARTMENT_OTHER): Payer: Self-pay

## 2024-03-24 LAB — CBC
HCT: 35.3 % — ABNORMAL LOW (ref 36.0–46.0)
Hemoglobin: 11.1 g/dL — ABNORMAL LOW (ref 12.0–15.0)
MCH: 29.9 pg (ref 26.0–34.0)
MCHC: 31.4 g/dL (ref 30.0–36.0)
MCV: 95.1 fL (ref 80.0–100.0)
Platelets: 476 K/uL — ABNORMAL HIGH (ref 150–400)
RBC: 3.71 MIL/uL — ABNORMAL LOW (ref 3.87–5.11)
RDW: 13.3 % (ref 11.5–15.5)
WBC: 7 K/uL (ref 4.0–10.5)
nRBC: 0 % (ref 0.0–0.2)

## 2024-03-24 LAB — CULTURE, BLOOD (ROUTINE X 2)
Culture: NO GROWTH
Special Requests: ADEQUATE

## 2024-03-24 LAB — BASIC METABOLIC PANEL WITH GFR
Anion gap: 7 (ref 5–15)
BUN: 15 mg/dL (ref 8–23)
CO2: 24 mmol/L (ref 22–32)
Calcium: 8.8 mg/dL — ABNORMAL LOW (ref 8.9–10.3)
Chloride: 107 mmol/L (ref 98–111)
Creatinine, Ser: 0.78 mg/dL (ref 0.44–1.00)
GFR, Estimated: >60 mL/min (ref >60)
Glucose, Bld: 87 mg/dL (ref 70–99)
Potassium: 4.3 mmol/L (ref 3.5–5.1)
Sodium: 138 mmol/L (ref 135–145)

## 2024-03-24 MED ORDER — AMOXICILLIN-POT CLAVULANATE 875-125 MG PO TABS
1.0000 | ORAL_TABLET | Freq: Two times a day (BID) | ORAL | 0 refills | Status: AC
  Filled 2024-03-24: qty 14, 7d supply, fill #0

## 2024-03-24 MED ORDER — POLYETHYLENE GLYCOL 3350 17 G PO PACK
17.0000 g | PACK | Freq: Every day | ORAL | 0 refills | Status: AC
  Filled 2024-03-24: qty 14, 14d supply, fill #0

## 2024-03-24 MED ORDER — GABAPENTIN 100 MG PO CAPS
200.0000 mg | ORAL_CAPSULE | Freq: Three times a day (TID) | ORAL | 0 refills | Status: AC
  Filled 2024-03-24: qty 42, 7d supply, fill #0

## 2024-03-24 MED ORDER — GABAPENTIN 300 MG PO CAPS: 300.0000 mg | ORAL_CAPSULE | Freq: Three times a day (TID) | ORAL | Status: DC

## 2024-03-24 MED ORDER — IOHEXOL 9 MG/ML PO SOLN: 1000.0000 mL | Freq: Once | ORAL | Status: DC

## 2024-03-24 MED ORDER — IOHEXOL 300 MG/ML  SOLN
100.0000 mL | Freq: Once | INTRAMUSCULAR | Status: AC | PRN
  Administered 2024-03-24: 100 mL via INTRAVENOUS

## 2024-03-24 MED ORDER — METHOCARBAMOL 500 MG PO TABS: 500.0000 mg | ORAL_TABLET | Freq: Four times a day (QID) | ORAL | Status: AC | PRN

## 2024-03-24 MED ORDER — IBUPROFEN 200 MG PO TABS: 600.0000 mg | ORAL_TABLET | Freq: Three times a day (TID) | ORAL | Status: AC | PRN

## 2024-03-24 NOTE — Plan of Care (Signed)

## 2024-03-24 NOTE — Discharge Summary (Cosign Needed Addendum)
 [facility] Discharge Summary   Patient ID: Jillian Norton MRN: [id number] DOB/AGE: 1940-09-29 83 y.o.  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 03/03/2024   Arthritis of carpometacarpal [facility]) joint of left thumb 03/05/2018   De Quervain's tenosynovitis, right 03/05/2018   Malignant neoplasm of upper-inner quadrant of right breast in female, estrogen receptor positive (HCC) 10/24/2017   Overweight (BMI 25.0-29.9) 04/07/2017   History of cancer of right breast 04/07/2017   History of DVT (deep vein thrombosis) 04/07/2017   GERD (gastroesophageal reflux disease) 11/01/2016   Primary insomnia 11/01/2016   Urge incontinence 11/01/2016   History of colon polyps 11/01/2016   Postnasal drip 11/01/2016   Shingles 06/27/2016    Consultants Urology Case management   Imaging: No results found.  Procedures Dr. Selma 03/19/24 - Complex cystorrhaphy; Approximately 2x2cm bladder dome injury successfully repaired in 2 layers with no leak after closure.   HPI: 56 yof who had left femoral hernia repair with vicryl on 7/8 with sbr for sbo.  Postop she had issues with urinary retention and was seen by urology. She had a postop ct scan two days after surgery with foley in place that showed mostly expected postop findings with a small fluid collection in left groin.  She had some AKI that eventually resolved. She had foley out eventually for retention and urology saw in hospital.  She had a successful voiding trial. She feels at one point postop she had acute pain (after first ct) and then noted the left groin swelling that has been progressive since then. Was discharged having bowel movements. Not much appetite. She has been having issues with voiding for about a  month and this includes preop She was voiding at home. Seen in our office 7/18 with left groin swelling.  Due to pain came to ER today. She has a ct scan concerning for bladder rupture anteriorly into the left groin now.  I have discussed with urology. She now has a foley draining about 300 cc of clear yellow urine.  Her hb was decreased significantly on lab draw. I am not sure of the source. Being rechecked to confirm.   Hospital Course:  She was admitted for further management of extraperitoneal bladder rupture. Urology was consulted. She was taking to the operating room for the above procedure by Dr. Selma. Tolerated the procedure and was admitted to the floor with foley catheter in place. She had a JP drain in place as well. Drain creatinine was checked and normal before drain removal on POD#2. Diet was slowly advanced as tolerated. Due to recurrent sharp LLQ pain after surgery a CT scan was ordered  which showed:  1. Redemonstration of sinus tract extending from the anterior wall of the urinary bladder with a smaller (approximately 2.7 x 4.8 cm), incomplete walled-off collection in the Space of Retzius/extra peritoneum. The sinus tract extends through the left lower quadrant anterior abdominal wall, with associated 3.5 x 10.9 cm walled-off collection in the anterior abdominal wall subcutaneous tissue.  This study was reviewed by urology MD, general surgery MD, and with radiology. Study raises concern for possible ongoing leakage from recent bladder repair but, given small volume of fluid and foley in place adequately draining, no urgent intervention was recommended.  IR aspiration of intra-abdominal collection to test for creatinine and  culture was recommended and patient politely declined.   On 7/29 the patients vitals were stable, pain controlled, tolerating PO, having bowel function, and stable for discharge with foley in place. She was discharged home with one week of Augmentin  for possible  wound infection. Return precautions were discussed. Patient plans to go to [facility] and then return [facility].   I have personally reviewed the patients medication history on the [facility] controlled substance database.    Physical Exam: General:  Alert, NAD, pleasant, comfortable Abd:  Soft, ND, L groin incision C/D/I, there is mild ecchymosis and fullness of the incision without cellulitis  GU: foley draining clear yellow urine  Allergies as of 03/24/2024       Reactions   Bee Venom Anaphylaxis   Sporanos [itraconazole] Anaphylaxis   Tamoxifen Anaphylaxis   Gave her a blood clot   Anastrozole  Other (See Comments)   Unknown, pt was unaware of this allergy    Sulfa Antibiotics Nausea And Vomiting        Medication List     STOP taking these medications    Wegovy  2.4 MG/0.75ML Soaj Generic drug: Semaglutide -Weight Management       TAKE these medications    acetaminophen  500 MG tablet Commonly known as: TYLENOL  Take 2 tablets (1,000 mg total) by mouth every 6 (six) hours as needed. What changed:  when to take this reasons to take this   albuterol  108 (90 Base) MCG/ACT inhaler Commonly known as: VENTOLIN  HFA Inhale 2 puffs into the lungs twice daily as needed (Use 2 puffs 2x a day prn Inhalation as directed 30 days) What changed:  how much to take when to take this reasons to take this   amoxicillin -clavulanate 875-125 MG tablet Commonly known as: AUGMENTIN  Take 1 tablet by mouth 2 (two) times daily for 7 days.   buPROPion  300 MG 24 hr tablet Commonly known as: WELLBUTRIN  XL Take 1 tablet (300 mg total) by mouth in the morning.   busPIRone  5 MG tablet Commonly known as: BUSPAR  Take 1 tablet (5 mg total) by mouth daily.   EPINEPHrine  0.3 mg/0.3 mL Soaj injection Commonly known as: EpiPen  2-Pak Inject as directed for allergic reaction (as directed Injection daily 1 days) What changed:  when to take  this reasons to take this   gabapentin  100 MG capsule Commonly known as: NEURONTIN  Take 2 capsules (200 mg total) by mouth 3 (three) times daily for 7 days. What changed:  how much to take when to take this reasons to take this   gabapentin  300 MG capsule Commonly known as: NEURONTIN  Take 1 capsule (300 mg total) by mouth 3 (three) times daily. What changed: You were already taking a medication with the same name, and this prescription was added. Make sure you understand how and when to take each.   ibuprofen  200 MG tablet Commonly known as: ADVIL  Take 3 tablets (600 mg total) by mouth every 8 (eight) hours as needed for moderate pain (pain score 4-6).   methocarbamol  500 MG tablet Commonly known as: ROBAXIN  Take 1 tablet (500 mg total) by mouth every 6 (six) hours as needed for muscle spasms. What changed: when to take this   moxifloxacin  0.5 % ophthalmic solution Commonly known as: VIGAMOX  Apply 1 drop to eye.   omeprazole  40 MG capsule Commonly known as: PRILOSEC Take 1 capsule (40 mg total) by mouth daily.   oxyCODONE  5 MG immediate release tablet Commonly known as: Oxy  IR/ROXICODONE  Take 1 tablet (5 mg total) by mouth every 6 (six) hours as needed for severe pain (pain score 7-10) (not relieved by tylenol  or robaxin ).   polyethylene glycol 17 g packet Commonly known as: MIRALAX  / GLYCOLAX  Take 17 g by mouth daily. Start taking on: March 25, 2024   Stool Softener/Laxative 50-8.6 MG tablet Generic drug: senna-docusate Take 1 tablet by mouth at bedtime. What changed:  when to take this reasons to take this   triamcinolone  cream 0.1 % Commonly known as: KENALOG  Apply 1 application on the skin twice a day Apply thin layer to affected areas twice a day x 1-3 weeks as needed. What changed:  how much to take when to take this reasons to take this   valACYclovir  1000 MG tablet Commonly known as: Valtrex  Take 1 tablet (1,000 mg total) by mouth 2 (two) times daily  for 4 days as needed for cold sores.   Voltaren 1 % Gel Generic drug: diclofenac Sodium Apply 1 Application topically daily as needed (pain).   zolpidem  10 MG tablet Commonly known as: AMBIEN  Take 10 mg by mouth at bedtime as needed for sleep. What changed: Another medication with the same name was removed. Continue taking this medication, and follow the directions you see here.          Follow-up Information     [facility]. Go in 2 week(s).   Why: for post-operative appointment with Dr. Selma Pass information: [address] [place]  [address] [phone]        Ann Fine, MD. Krista on 04/08/2024.   Specialty: General Surgery Why: For post-operative follow up. call to confirm appointment date/time. Contact information: [address], [address] [place] [address] [phone]                 Signed: Almarie Pringle, [facility] 03/24/2024, 2:58 PM

## 2024-03-24 NOTE — Discharge Instructions (Signed)
 [facility]      [facility], [place] [phone]  OPEN ABDOMINAL SURGERY: POST OP INSTRUCTIONS  Always review your discharge instruction sheet given to you by the facility where your surgery was performed.  IF YOU HAVE DISABILITY OR FAMILY LEAVE FORMS, YOU MUST BRING THEM TO THE OFFICE FOR PROCESSING.  PLEASE DO NOT GIVE THEM TO YOUR DOCTOR.  A prescription for pain medication may be given to you upon discharge.  Take your pain medication as prescribed, if needed.  If narcotic pain medicine is not needed, then you may take acetaminophen (Tylenol) or ibuprofen (Advil) as needed. Take your usually prescribed medications unless otherwise directed. If you need a refill on your pain medication, please contact your pharmacy. They will contact our office to request authorization.  Prescriptions will not be filled after 5pm or on week-ends. You should follow a light diet the first few days after arrival home, such as soup and crackers, pudding, etc.unless your doctor has advised otherwise. A high-fiber, low fat diet can be resumed as tolerated.   Be sure to include lots of fluids daily. Most patients will experience some swelling and bruising on the chest and neck area.  Ice packs will help.  Swelling and bruising can take several days to resolve Most patients will experience some swelling and bruising in the area of the incision. Ice pack will help. Swelling and bruising can take several days to resolve..  It is common to experience some constipation if taking pain medication after surgery.  Increasing fluid intake and taking a stool softener will usually help or prevent this problem from occurring.  A mild laxative (Milk of Magnesia or Miralax) should be taken according to package directions if there are no bowel movements after 48 hours.  You may have steri-strips (small skin tapes) in place directly over the incision.  These strips should be left on the skin for 7-10 days.  If your surgeon used skin  glue on the incision, you may shower in 24 hours.  The glue will flake off over the next 2-3 weeks.  Any sutures or staples will be removed at the office during your follow-up visit. You may find that a light gauze bandage over your incision may keep your staples from being rubbed or pulled. You may shower and replace the bandage daily. ACTIVITIES:  You may resume regular (light) daily activities beginning the next day--such as daily self-care, walking, climbing stairs--gradually increasing activities as tolerated.  You may have sexual intercourse when it is comfortable.  Refrain from any heavy lifting or straining until approved by your doctor. You may drive when you no longer are taking prescription pain medication, you can comfortably wear a seatbelt, and you can safely maneuver your car and apply brakes Return to Work: ___________________________________ Bonita Quin should see your doctor in the office for a follow-up appointment approximately two weeks after your surgery.  Make sure that you call for this appointment within a day or two after you arrive home to insure a convenient appointment time. OTHER INSTRUCTIONS:  _____________________________________________________________ _____________________________________________________________  WHEN TO CALL YOUR DOCTOR: Fever over 101.0 Inability to urinate Nausea and/or vomiting Extreme swelling or bruising Continued bleeding from incision. Increased pain, redness, or drainage from the incision. Difficulty swallowing or breathing Muscle cramping or spasms. Numbness or tingling in hands or feet or around lips.  The clinic staff is available to answer your questions during regular business hours.  Please don't hesitate to call and ask to speak to one of  the nurses if you have concerns.  For further questions, please visit [email or web address]

## 2024-03-24 NOTE — Progress Notes (Signed)
 5 Days Post-Op Subjective: Doing well, sitting in chair, denies pain, N/V.   Objective: Vital signs in last 24 hours: Temp:  [97.6 F (36.4 C)-98 F (36.7 C)] 97.6 F (36.4 C) (07/29 0451) Pulse Rate:  [75-92] 75 (07/29 0451) Resp:  [16-18] 18 (07/29 0451) BP: (117-133)/(63-70) 117/63 (07/29 0451)  Intake/Output from previous day: 07/28 0701 - 07/29 0700 In: 1220 [P.O.:1220] Out: 2350 [Urine:2350] Intake/Output this shift: Total I/O In: 250 [P.O.:250] Out: 225 [Urine:225]  Physical Exam:  General: Alert and oriented CV: RRR Lungs: Clear Abdomen: Soft, ND, ATTP; Left pfanenstiel insicion C/D/I, no surrounding erythema, no significant fluctuance near the incision, drain site covered  GU: draining clear yellow urine   Lab Results: Recent Labs    03/22/24 0536 03/23/24 0510 03/24/24 0517  HGB 10.5* 11.1* 11.1*  HCT 32.9* 34.3* 35.3*   BMET Recent Labs    03/23/24 0510 03/24/24 0517  NA 139 138  K 4.5 4.3  CL 105 107  CO2 25 24  GLUCOSE 98 87  BUN 17 15  CREATININE 0.63 0.78  CALCIUM 9.1 8.8*     Studies/Results: No results found.  Assessment/Plan: Bladder injury s/p complex cystorrhaphy 03/19/2024 Incarcerated femoral hernia s/p SBR and repair on 03/03/2024   -Pain control prn -Labs appropriate -Recommend foley and leg bag teaching -Keep foley for 2 weeks and will arrange for cystogram in our office prior to removal f/u has been requested  - CT scan shows expected fluid, LABs WNL. OK to DC from urology stand point. FINAL CT read pending  - ok to DC from urology standpoint     LOS: 5 days   Jackey Pea MD 03/24/2024, 12:58 PM [facility]

## 2024-03-24 NOTE — TOC Progression Note (Signed)
 Transition of Care [facility]) - Progression Note    Patient Details  Name: Jillian Norton MRN: [id number] Date of Birth: 1941-01-10  Transition [facility] Care [facility]) CM/SW Contact  Heather DELENA Saltness, LCSW Phone Number: 03/24/2024, 10:07 AM  Clinical Narrative:    CSW received phone call from [place] [phone], social worker, at [facility]. Cierra expressed concerns regarding pt discharging back to ILF while still having foley catheter. Per Oddis, pt would benefit from SNF at [facility] prior to discharging back to ILF. CSW advised that MD and PT still need to evaluate pt today. CSW notified MD and PT of facility's concern. Cierra reports pt would not need insurance authorization for rehab at [facility]. TOC will continue to follow.     Barriers to Discharge: Continued Medical Work up    Expected Discharge Plan and Services In-house Referral: NA Discharge Planning Services: NA   Living arrangements for the past 2 months: Independent Living Facility                 DME Arranged: N/A DME Agency: NA       HH Arranged: NA HH Agency: NA         Social Drivers of Health (SDOH) Interventions SDOH Screenings   Food Insecurity: No Food Insecurity (03/19/2024)  Housing: Low Risk  (03/19/2024)  Transportation Needs: No Transportation Needs (03/19/2024)  Utilities: Not At Risk (03/19/2024)  Social Connections: Moderately Isolated (03/19/2024)  Tobacco Use: Medium Risk (03/19/2024)    Readmission Risk Interventions    03/20/2024    2:24 PM  Readmission Risk Prevention Plan  Post Dischage Appt Complete  Medication Screening Complete  Transportation Screening Complete    Heather Saltness, MSW, LCSW Clinical Social Worker Inpatient Care Management 03/24/2024 10:11 AM

## 2024-03-24 NOTE — TOC Transition Note (Signed)
 Transition of Care [facility]) - Discharge Note   Patient Details  Name: Jillian Norton MRN: [id number] Date of Birth: 1940-12-18  Transition of Care [facility]) CM/SW Contact:  Heather DELENA Saltness, LCSW Phone Number: 03/24/2024, 3:22 PM   Clinical Narrative:    Pt to discharge back to [facility] today. Pt is ILF resident, but now transitioning into rehab at [facility]. Pt accepted to room 151. D/C packet placed in pt's chart at RN station. RN to call report to [phone]. Pt's daughter, Camie Picket, will transport pt back to [facility] in private vehicle. Pt and daughter in agreement with discharge plan. No further TOC needs at this time. TOC signing off.   Final next level of care: Skilled Nursing Facility Barriers to Discharge: Barriers Resolved   Patient Goals and CMS Choice Patient states their goals for this hospitalization and ongoing recovery are:: To return to [facility].gov Compare Post Acute Care list provided to:: Patient Choice offered to / list presented to : Patient Novice ownership interest in [facility].provided to:: Patient    Discharge Placement  [facility]            Patient chooses bed at: [facility] Patient to be transferred to facility by: Daughter, Camie Picket Name of family member notified: Camie Picket Patient and family notified of of transfer: 03/24/24  Discharge Plan and Services Additional resources added to the After Visit Summary for  follow up In-house Referral: NA Discharge Planning Services: NA            DME Arranged: N/A DME Agency: NA       HH Arranged: NA HH Agency: NA        Social Drivers of Health (SDOH) Interventions SDOH Screenings   Food Insecurity: No Food Insecurity (03/19/2024)  Housing: Low Risk  (03/19/2024)  Transportation Needs: No Transportation Needs (03/19/2024)  Utilities: Not At Risk (03/19/2024)  Social Connections: Moderately Isolated (03/19/2024)  Tobacco Use: Medium Risk  (03/19/2024)     Readmission Risk Interventions    03/20/2024    2:24 PM  Readmission Risk Prevention Plan  Post Dischage Appt Complete  Medication Screening Complete  Transportation Screening Complete

## 2024-03-24 NOTE — Progress Notes (Signed)
 Physical Therapy Treatment Patient Details Name: Jillian Norton MRN: [id number] DOB: [birth year] Today's Date: 03/24/2024   History of Present Illness Jillian Norton is an 83 yo F s/p femoral hernia repair and small bowel resection 7/8 VB complicated by bladder injury seen on CT 7/23, now s/p cystorrhaphy PMH: femoral hernia, De Quervain's R, OA L thumb, DVT, R breast Ca, GERD, shingles    PT Comments  Pt is progressing well with mobility, she ambulated 66' with a RW with verbal cues for positioning in RW, no loss of balance, 1/10 incisional pain with activity. Pt puts forth good effort.     If plan is discharge home, recommend the following: A little help with bathing/dressing/bathroom;Assistance with cooking/housework;Help with stairs or ramp for entrance;Assist for transportation   Can travel by private vehicle     Yes  Equipment Recommendations  Rolling walker (2 wheels)    Recommendations for Other Services       Precautions / Restrictions Precautions Precautions: Other (comment) Recall of Precautions/Restrictions: Intact Precaution/Restrictions Comments: abdominal surgery Restrictions Weight Bearing Restrictions Per Provider Order: No     Mobility  Bed Mobility               General bed mobility comments: up in recliner    Transfers Overall transfer level: Needs assistance Equipment used: Rolling walker (2 wheels) Transfers: Sit to/from Stand Sit to Stand: Supervision           General transfer comment: VCs hand placement, increased time    Ambulation/Gait Ambulation/Gait assistance: Supervision Gait Distance (Feet): 380 Feet Assistive device: Rolling walker (2 wheels) Gait Pattern/deviations: Step-through pattern, Decreased stride length, Trunk flexed Gait velocity: decr     General Gait Details: VCs posture and positioning in RW, no loss of balance, 1/10 incisional pain   Stairs             Wheelchair Mobility     Tilt Bed     Modified Rankin (Stroke Patients Only)       Balance Overall balance assessment: Needs assistance   Sitting balance-Leahy Scale: Good     Standing balance support: Bilateral upper extremity supported, Reliant on assistive device for balance, During functional activity Standing balance-Leahy Scale: Fair                              Hotel manager: Impaired Factors Affecting Communication: Hearing impaired  Cognition Arousal: Alert Behavior During Therapy: WFL for tasks assessed/performed   PT - Cognitive impairments: No apparent impairments                         Following commands: Intact      Cueing Cueing Techniques: Verbal cues  Exercises      General Comments        Pertinent Vitals/Pain Pain Assessment Pain Score: 1  Pain Location: L sided abdominal incision with movement Pain Descriptors / Indicators: Sore Pain Intervention(s): Limited activity within patient's tolerance, Monitored during session, Repositioned    Home Living                          Prior Function            PT Goals (current goals can now be found in the care plan section) Acute Rehab PT Goals Patient Stated Goal: return to golf and swimming, go to granddaughter's wedding in [place] in  Sept PT Goal Formulation: With patient Time For Goal Achievement: 04/04/24 Potential to Achieve Goals: Good Progress towards PT goals: Progressing toward goals    Frequency    Min 3X/week      PT Plan      Co-evaluation              AM-PAC PT 6 Clicks Mobility   Outcome Measure  Help needed turning from your back to your side while in a flat bed without using bedrails?: None Help needed moving from lying on your back to sitting on the side of a flat bed without using bedrails?: None Help needed moving to and from a bed to a chair (including a wheelchair)?: None Help needed standing up from a chair using your arms  (e.g., wheelchair or bedside chair)?: None Help needed to walk in hospital room?: None Help needed climbing 3-5 steps with a railing? : A Little 6 Click Score: 23    End of Session   Activity Tolerance: Patient tolerated treatment well Patient left: with call bell/phone within reach;in chair Nurse Communication: Mobility status PT Visit Diagnosis: Unsteadiness on feet (R26.81);Other abnormalities of gait and mobility (R26.89);Muscle weakness (generalized) (M62.81);Pain     Time: 8942-8881 PT Time Calculation (min) (ACUTE ONLY): 21 min  Charges:    $Gait Training: 8-22 mins PT General Charges $$ ACUTE PT VISIT: 1 Visit                     Sylvan Delon Copp PT 03/24/2024  Acute Rehabilitation Services  Office [phone]

## 2024-03-25 ENCOUNTER — Encounter: Payer: Self-pay | Admitting: Orthopedic Surgery

## 2024-03-25 ENCOUNTER — Non-Acute Institutional Stay (SKILLED_NURSING_FACILITY): Payer: Self-pay | Admitting: Orthopedic Surgery

## 2024-03-25 DIAGNOSIS — Z8719 Personal history of other diseases of the digestive system: Secondary | ICD-10-CM

## 2024-03-25 DIAGNOSIS — R2681 Unsteadiness on feet: Secondary | ICD-10-CM | POA: Diagnosis not present

## 2024-03-25 DIAGNOSIS — N3289 Other specified disorders of bladder: Secondary | ICD-10-CM

## 2024-03-25 DIAGNOSIS — K219 Gastro-esophageal reflux disease without esophagitis: Secondary | ICD-10-CM

## 2024-03-25 DIAGNOSIS — R2689 Other abnormalities of gait and mobility: Secondary | ICD-10-CM | POA: Diagnosis not present

## 2024-03-25 DIAGNOSIS — Z9889 Other specified postprocedural states: Secondary | ICD-10-CM | POA: Diagnosis not present

## 2024-03-25 DIAGNOSIS — K4190 Unilateral femoral hernia, without obstruction or gangrene, not specified as recurrent: Secondary | ICD-10-CM | POA: Diagnosis not present

## 2024-03-25 DIAGNOSIS — F5101 Primary insomnia: Secondary | ICD-10-CM

## 2024-03-25 DIAGNOSIS — F32A Depression, unspecified: Secondary | ICD-10-CM

## 2024-03-25 NOTE — Progress Notes (Unsigned)
 Location:  [facility] Room Number: 151-P Place of Service:  SNF (31) Provider: Greig Cluster, NP  Code Status: FULL CODE Goals of Care:     03/25/2024   12:55 PM  Advanced Directives  Does Patient Have a Medical Advance Directive? Yes  Type of Estate agent of [place];Living will  Does patient want to make changes to medical advance directive? No - Patient declined  Copy of Healthcare Power of Attorney in Chart? Yes - validated most recent copy scanned in chart (See row information)     Chief Complaint  Patient presents with   Hospitalization Follow-up    HPI: Patient is a 83 y.o. female seen today for hospital follow-up s/p admission from   Past Medical History:  Diagnosis Date   Anxiety    Breast cancer (HCC) 06/27/2012   Right   Cataract    DVT (deep venous thrombosis) (HCC) 06/28/2015   Dyslipidemia    GERD (gastroesophageal reflux disease)    Insomnia    Osteopenia    Overactive bladder    Personal history of radiation therapy    Shingles 06/2016   Thyroid  disease     Past Surgical History:  Procedure Laterality Date   BLADDER REPAIR  03/19/2024   Procedure: REPAIR, BLADDER;  Surgeon: Kinsinger, Herlene Righter, MD;  Location: [facility] ORS;  Service: General;;  Cystorrhaphy with Dr Selma and Dr Elisabeth   BOWEL RESECTION N/A 03/03/2024   Procedure: EXCISION, SMALL INTESTINE;  Surgeon: Ann Fine, MD;  Location: [facility] OR;  Service: General;  Laterality: N/A;   BREAST LUMPECTOMY Right 2014   BREAST LUMPECTOMY  2013   right   CATARACT EXTRACTION W/ INTRAOCULAR LENS IMPLANT Bilateral    COLONOSCOPY  08/27/2012   [place]    INGUINAL HERNIA REPAIR N/A 03/03/2024   Procedure: REPAIR, HERNIA, INGUINAL/FEMORAL, ADULT;  Surgeon: Ann Fine, MD;  Location: [facility] OR;  Service: General;  Laterality: N/A;  LEFT INGUINAL HERNIA  REPAIR POSSIBLE LAPAROTOMY WITH SMALL BOWEL RESECTION   INSERTION OF MESH Left 03/03/2024   Procedure:  INSERTION OF VICRYL MESH, LEFT GROIN;  Surgeon: Ann Fine, MD;  Location: [facility] OR;  Service: General;  Laterality: Left;   KNEE ARTHROSCOPY     left   LAPAROSCOPIC ABDOMINAL EXPLORATION N/A 03/03/2024   Procedure: EXPLORATION, ABDOMEN, LAPAROSCOPIC;  Surgeon: Ann Fine, MD;  Location: [facility] OR;  Service: General;  Laterality: N/A;   LAPAROTOMY N/A 03/19/2024   Procedure: LAPAROTOMY, EXPLORATORY;  Surgeon: Stevie Herlene Righter, MD;  Location: [facility];  Service: General;  Laterality: N/A;  Dr. Selma or Dr. Elisabeth will assist   LASIK Bilateral    REDUCTION MAMMAPLASTY Bilateral 1985   anchor scar    VAGINAL HYSTERECTOMY  2002    Allergies  Allergen Reactions   Bee Venom Anaphylaxis   Sporanos [Itraconazole] Anaphylaxis   Tamoxifen Anaphylaxis    Gave her a blood clot   Anastrozole  Other (See Comments)    Unknown, pt was unaware of this allergy    Sulfa Antibiotics Nausea And Vomiting    Outpatient Encounter Medications as of 03/25/2024  Medication Sig   acetaminophen  (TYLENOL ) 500 MG tablet Take 2 tablets (1,000 mg total) by mouth every 6 (six) hours as needed.   albuterol  (VENTOLIN  HFA) 108 (90 Base) MCG/ACT inhaler Use 2 puffs 2x a day prn Inhalation as directed 30 days   amoxicillin -clavulanate (AUGMENTIN ) 875-125 MG tablet Take 1 tablet by mouth 2 (two) times daily for 7 days.   buPROPion  (  WELLBUTRIN  XL) 300 MG 24 hr tablet Take 1 tablet (300 mg total) by mouth in the morning.   busPIRone  (BUSPAR ) 5 MG tablet Take 1 tablet (5 mg total) by mouth daily.   diclofenac Sodium (VOLTAREN) 1 % GEL Apply 1 Application topically daily as needed (pain).   EPINEPHrine  (EPIPEN  2-PAK) 0.3 mg/0.3 mL IJ SOAJ injection as directed Injection daily 1 days   EPINEPHrine  0.3 mg/0.3 mL IJ SOAJ injection Inject 0.3 mg into the muscle as needed for anaphylaxis.   gabapentin  (NEURONTIN ) 100 MG capsule Take 2 capsules (200 mg total) by mouth 3 (three) times daily for 7 days.   gabapentin  (NEURONTIN ) 300  MG capsule Take 300 mg by mouth 3 (three) times daily.   ibuprofen  (ADVIL ) 200 MG tablet Take 3 tablets (600 mg total) by mouth every 8 (eight) hours as needed for moderate pain (pain score 4-6).   methocarbamol  (ROBAXIN ) 500 MG tablet Take 1 tablet (500 mg total) by mouth every 6 (six) hours as needed for muscle spasms.   omeprazole  (PRILOSEC) 40 MG capsule Take 1 capsule (40 mg total) by mouth daily.   oxyCODONE  (OXY IR/ROXICODONE ) 5 MG immediate release tablet Take 1 tablet (5 mg total) by mouth every 6 (six) hours as needed for severe pain (pain score 7-10) (not relieved by tylenol  or robaxin ).   polyethylene glycol (MIRALAX  / GLYCOLAX ) 17 g packet Take 17 g (one packet dissolved in liquid) by mouth daily.   senna-docusate (SENOKOT-S) 8.6-50 MG tablet Take 1 tablet by mouth at bedtime.   triamcinolone  cream (KENALOG ) 0.1 % Apply 1 application on the skin twice a day Apply thin layer to affected areas twice a day x 1-3 weeks as needed.   valACYclovir  (VALTREX ) 1000 MG tablet Take 1 tablet (1,000 mg total) by mouth 2 (two) times daily for 4 days as needed for cold sores.   zolpidem  (AMBIEN ) 5 MG tablet Take 10 mg by mouth at bedtime as needed for sleep.   moxifloxacin  (VIGAMOX ) 0.5 % ophthalmic solution Apply 1 drop to eye. (Patient not taking: Reported on 03/25/2024)   zolpidem  (AMBIEN ) 10 MG tablet Take 10 mg by mouth at bedtime as needed for sleep. (Patient not taking: Reported on 03/25/2024)   No facility-administered encounter medications on file as of 03/25/2024.    Review of Systems:  Review of Systems  Health Maintenance  Topic Date Due   COVID-19 Vaccine (6 - 2024-25 season) 04/28/2023   INFLUENZA VACCINE  03/27/2024   Medicare Annual Wellness (AWV)  06/02/2024   DTaP/Tdap/Td (2 - Td or Tdap) 04/15/2028   Pneumococcal Vaccine: 50+ Years  Completed   DEXA SCAN  Completed   Zoster Vaccines- Shingrix  Completed   Hepatitis B Vaccines  Aged Out   HPV VACCINES  Aged Out    Meningococcal B Vaccine  Aged Out    Physical Exam: Vitals:   03/25/24 1210  BP: 109/60  Pulse: 61  Resp: 14  Temp: 98.1 F (36.7 C)  SpO2: 96%  Weight: 139 lb 6.4 oz (63.2 kg)  Height: 5' 4 (1.626 m)   Body mass index is 23.93 kg/m. Physical Exam  Labs reviewed: Basic Metabolic Panel: Recent Labs    03/09/24 0714 03/10/24 0637 03/18/24 2210 03/22/24 0536 03/23/24 0510 03/24/24 0517  NA  --   --    < > 137 139 138  K  --   --    < > 4.5 4.5 4.3  CL  --   --    < >  103 105 107  CO2  --   --    < > 28 25 24   GLUCOSE  --   --    < > 89 98 87  BUN  --   --    < > 16 17 15   CREATININE  --   --    < > 0.75 0.63 0.78  CALCIUM  --   --    < > 9.1 9.1 8.8*  MG 2.1 2.0  --  2.3  --   --   PHOS 3.7 3.0  --  3.8  --   --    < > = values in this interval not displayed.   Liver Function Tests: Recent Labs    03/02/24 2228 03/05/24 1511 03/18/24 2210  AST 14*  --  12*  ALT 12  --  17  ALKPHOS 59  --  65  BILITOT 0.4  --  0.7  PROT 7.4  --  6.8  ALBUMIN 4.4 3.0* 3.0*   Recent Labs    03/02/24 2228 03/18/24 2210  LIPASE 21 31   No results for input(s): AMMONIA in the last 8760 hours. CBC: Recent Labs    03/18/24 2210 03/19/24 0130 03/19/24 0531 03/22/24 0536 03/23/24 0510 03/24/24 0517  WBC 7.7 6.0   < > 6.1 6.1 7.0  NEUTROABS 4.7 3.9  --   --   --   --   HGB 6.8* 10.2*   < > 10.5* 11.1* 11.1*  HCT 20.9* 32.2*   < > 32.9* 34.3* 35.3*  MCV 92.5 92.3   < > 93.5 93.7 95.1  PLT 653* 474*   < > 458* 456* 476*   < > = values in this interval not displayed.   Lipid Panel: No results for input(s): CHOL, HDL, LDLCALC, TRIG, CHOLHDL, LDLDIRECT in the last 8760 hours. No results found for: HGBA1C  Procedures since last visit: CT CYSTOGRAM PELVIS Result Date: 03/19/2024 EXAM: CT UROGRAM 03/19/2024 02:26:42 AM TECHNIQUE: CT of the abdomen and pelvis was performed before and after the administration of intravenous contrast as per CT urogram  protocol. Multiplanar reformatted images as well as MIP urogram images are provided for review. Automated exposure control, iterative reconstruction, and/or weight based adjustment of the mA/kV was utilized to reduce the radiation dose to as low as reasonably achievable. COMPARISON: CT abdomen/pelvis earlier today. CLINICAL HISTORY: Bladder diverticulum. S/p left inguinal hernia surgery, abnormality of the bladder seen on an earlier CT; 100 mL Omni via urinary catheter. FINDINGS: LOWER CHEST: No acute abnormality. LIVER: The liver is unremarkable. GALLBLADDER AND BILE DUCTS: Gallbladder is unremarkable. No biliary ductal dilatation. SPLEEN: No acute abnormality. PANCREAS: No acute abnormality. ADRENAL GLANDS: No acute abnormality. KIDNEYS, URETERS AND BLADDER: No stones in the kidneys or ureters. No hydronephrosis. No perinephric or periureteral stranding. High density contrast administered via indwelling Foley catheter extravasates into the extraperitoneal space anterior to the bladder (image 6). Associated frank perforation of the anterior bladder (image 41). Additional contrast within the 3.5 x 8.1 cm fluid collection in the left lower anterior pelvic wall (image 7), previously demonstrated to communicate with the extraperitoneal space. GI AND BOWEL: Stomach demonstrates no acute abnormality. There is no bowel obstruction. PERITONEUM AND RETROPERITONEUM: No ascites. No free air. VASCULATURE: Aorta is normal in caliber. LYMPH NODES: No lymphadenopathy. REPRODUCTIVE ORGANS: No acute abnormality. BONES AND SOFT TISSUES: No acute osseous abnormality. No focal soft tissue abnormality. IMPRESSION: 1. Frank perforation of the anterior bladder with associated extraperitoneal contrast  extravasation. 2. Contrast within a 3.5 x 8.1 cm fluid collection in the left lower anterior pelvic wall, previously demonstrated to communicate with the extraperitoneal space. Electronically signed by: Pinkie Pebbles MD 03/19/2024 02:31  AM EDT RP Workstation: [id number]   CT ABDOMEN PELVIS W CONTRAST Result Date: 03/19/2024 EXAM: CT ABDOMEN AND PELVIS WITH CONTRAST 03/19/2024 12:16:32 AM TECHNIQUE: CT of the abdomen and pelvis was performed with the administration of intravenous contrast. Multiplanar reformatted images are provided for review. Automated exposure control, iterative reconstruction, and/or weight based adjustment of the mA/kV was utilized to reduce the radiation dose to as low as reasonably achievable. COMPARISON: 03/05/2024 CLINICAL HISTORY: LLQ abdominal pain. left lower quadrant abdominal pain for about 1 day. She had abdominal hernia surgery 12 days ago. EMS report the site is swollen, but the incision looks normal, hx of breast ca, GFR>60. FINDINGS: LOWER CHEST: No acute abnormality. LIVER: The liver is unremarkable. GALLBLADDER AND BILE DUCTS: Gallbladder is unremarkable. No biliary ductal dilatation. SPLEEN: No acute abnormality. PANCREAS: No acute abnormality. ADRENAL GLANDS: No acute abnormality. KIDNEYS, URETERS AND BLADDER: No stones in the kidneys or ureters. No hydronephrosis. No perinephric or periureteral stranding. Frank anterior bladder perforation (image 59) with associated 2.3 x 7.0 cm walled off fluid collection anteriorly in the extraperitoneal space (image 57) directly communicating with an additional 3.6 x 8.8 cm fluid collection in the left lower anterior abdominal wall (image 60). GI AND BOWEL: Small bowel resection with suture line in the left mid/lower abdomen. Normal appendix (image 45). No bowel obstruction. No bowel wall thickening. PERITONEUM AND RETROPERITONEUM: No free air. VASCULATURE: Atherosclerotic calcifications of the abdominal aorta and branch vessels, although patent. LYMPH NODES: No lymphadenopathy. REPRODUCTIVE ORGANS: Status post hysterectomy. BONES AND SOFT TISSUES: Mild degenerative changes of the lumbar spine. No acute osseous abnormality. No focal soft tissue abnormality. IMPRESSION:  1. Frank anterior bladder perforation. 2. Associated 2.3 x 7.0 cm walled off fluid collection in the extraperitoneal space. 3. Direct communication with additional 3.6 x 8.8 cm fluid collection in the left lower anterior abdominal wall. 4. Surgical consultation is advised. Electronically signed by: Pinkie Pebbles MD 03/19/2024 12:25 AM EDT RP Workstation: [id number]    Assessment/Plan There are no diagnoses linked to this encounter.   Labs/tests ordered:  * No order type specified * Next appt:  Visit date not found

## 2024-03-26 ENCOUNTER — Other Ambulatory Visit: Payer: Self-pay | Admitting: Adult Health

## 2024-03-26 DIAGNOSIS — R2681 Unsteadiness on feet: Secondary | ICD-10-CM | POA: Diagnosis not present

## 2024-03-26 DIAGNOSIS — R2689 Other abnormalities of gait and mobility: Secondary | ICD-10-CM | POA: Diagnosis not present

## 2024-03-26 DIAGNOSIS — K4190 Unilateral femoral hernia, without obstruction or gangrene, not specified as recurrent: Secondary | ICD-10-CM | POA: Diagnosis not present

## 2024-03-26 MED ORDER — OXYCODONE HCL 5 MG PO TABS: 5.0000 mg | ORAL_TABLET | Freq: Four times a day (QID) | ORAL | 0 refills | Status: AC | PRN

## 2024-03-26 NOTE — Progress Notes (Signed)
 PROVIDER:  PUJA GOSAI MACZIS, PA  MRN: [id number] DOB: 05/30/1941 DATE OF ENCOUNTER: 03/27/2024 Interval History:     Patient is s/p urgent open left femoral hernia repair with mesh, small bowel resection, and diagnostic laparoscopy on 03/03/24. Patient's post op course was complicated by urinary retention, AKI, ileus, UTI. She completed course of antibiotics inpatient for her UTI. At time of discharge, AKI, patient was voiding spontaneously and ileus had resolved. Was passing flatus and having bowel movements at time of discharge. Of note, patient did have some edema to lower abdominal wall that was felt to be reactive in nature to her surgery.  Since discharge patient states edema on right lower abdominal wall has resolved but still swollen and tender on left. Still passing flatus and having bowel movements. No nausea but does not have much of an appetite. She wanted to be evaluated for the persistent swelling on left side. No erythema, no drainage, no fevers, no chills.  She saw Dr. Ann on 03/13/2024 (see HPI above).  She went to the ER on 03/18/2024 and was found to have extraperitoneal bladder rupture.  She underwent complex cystorrhaphy on 03/19/2024 by Dr. Selma.  She had a JP drain in place as well.  Drain creatinine was checked and was normal before drain removal on postop day 2.  She developed recurrent sharp left lower quadrant pain after surgery so a CT scan was ordered which showed redemonstration of sinus tract extending from the anterior wall of the urinary bladder with a smaller, 2.7 x 4.8 cm, incomplete walled off collection.  This suggested possible ongoing leakage from recent bladder repair but given small volume of fluid and Foley in place adequately draining no urgent intervention was recommended.  IR aspiration of intra-abdominal fluid collection to test for creatinine and culture was recommended but patient politely declined. Discharged on 03/24/2024 with Augmentin .    She is presenting  to the office today with concerns of redness and ongoing tenderness to the left groin.  She states when she was discharged from the hospital she went to [facility].  She immediately noticed some redness around the incision at that time.  She also experienced a large amount of pale, pink, cloudy fluid evacuating from the lateral aspect of the groin incision.  She states there is no drainage yesterday.  She denies fever.  States her bowels are moving better.  She states her urine appears clear from the Foley catheter, without blood.     Objective  Physical Exam Gen: NAD CV: RRR Pulm: NWOB Abd: Soft, tender to palpation around left surgical incision. Rest of abdomen soft and nontender.   Pathology SMALL BOWEL, RESECTION:  Segments of small bowel with hyperemia and focal hemorrhage consistent  with incarcerated inguinal hernia.   Assessment and Plan:     Jillian Norton is a 83 y.o. female who is s/p open left femoral hernia repair with mesh, small bowel resection, and diagnostic laparoscopy on 03/03/24.  Diagnoses and all orders for this visit:  History of femoral hernia repair -     CT pelvis without contrast -     Ambulatory Referral to Interventional Radiology    She presents today with concerns of left groin redness, tenderness, and drainage.  Dr. Ebbie also evaluated the patient and does not believe the surgical site is infected.  We recommended a repeat CT scan pelvis without contrast to reevaluate fluid collection.  He also advised IR drain placement to evacuate the fluid and expedite healing.  She provided understanding and agreed to proceed.  Hopefully this will be scheduled in the next couple of days.  If she develops any worsening issues in the meantime, she will call the office or go to the ER.   The plan was discussed in detail with the patient today, who expressed understanding.  The patient has our contact information, and understands to call the clinic with any additional  questions or concerns in the interval.  I would be happy to see the patient back.  PUJA GOSAI MACZIS, PA

## 2024-03-26 NOTE — Progress Notes (Signed)
 Location:  [facility] Room Number: 151-P Place of Service:  SNF [id number]) Provider:  Greig FORBES Cluster, NP   [name], MD  Patient Care Team: [name], MD as PCP - General (Internal Medicine) [name], DO as Consulting Physician (Geriatric Medicine) [name], MD as Consulting Physician (Orthopedic Surgery)  Extended Emergency Contact Information Primary Emergency Contact: Littlejohn,Sara  [place] Home Phone: [phone] Relation: Daughter Secondary Emergency Contact: Murphy,Jim Mobile Phone: [phone] Relation: Significant other  Code Status:  DNR Goals of care: Advanced Directive information    03/25/2024   12:55 PM  Advanced Directives  Does Patient Have a Medical Advance Directive? Yes  Type of Estate agent of [place];Living will  Does patient want to make changes to medical advance directive? No - Patient declined  Copy of Healthcare Power of Attorney in Chart? Yes - validated most recent copy scanned in chart (See row information)     Chief Complaint  Patient presents with   Hospitalization Follow-up    HPI:  Pt is a 83 y.o. female seen today for f/u s/p hospitalization at [facility] 07/23-07/29.   She currently resides on the rehab unit at [facility]. PMH: GERD, arthritis, right breast cancer, h/o DVT, incontinence and insomnia.   07/08 she underwent left femoral hernia repair due to SBO. She continues to have lower abdominal pain and urinary retention after procedure. 07/18 she had general surgery f/u and was found to have left groin swelling. 07/23 she presented to the ED due to worsening left groin pain and swelling. CT abdomen noted anterior bladder perforation and 2.3 x 7.0 walled off fluids collection in extraperitoneal space. CT cystogram confirmed bladder rupture. General surgery and urology were consulted. 07/24 she underwent exploratory laparotomy and bladder  repair which involved placing JP drain and foley catheter. WBC stayed normal during hospitalization. Hgb was noted to be 6.8. She was given 1 unit of PRBC. Hgb improved > 10. Diet was slowly advanced and tolerated. JP drain was removed prior to discharge. She was prescribed Augmentin  x 7 days. Gabapentin  was prescribed for pain. Advised to f/u with general surgery and urology outpatient.   Today, she reports intermittent bladder spasms. Surgical site remains tender. She had a moderate amount of serosanguinous drainage last night, improved this morning. Appetite fair. Foley placed and UOP is adequate. Ambulates with walker. Afebrile. Vitals stable.    Past Medical History:  Diagnosis Date   Anxiety    Breast cancer (HCC) 06/27/2012   Right   Cataract    DVT (deep venous thrombosis) (HCC) 06/28/2015   Dyslipidemia    GERD (gastroesophageal reflux disease)    Insomnia    Osteopenia    Overactive bladder    Personal history of radiation therapy    Shingles 06/2016   Thyroid  disease    Past Surgical History:  Procedure Laterality Date   BLADDER REPAIR  03/19/2024   Procedure: REPAIR, BLADDER;  Surgeon: Kinsinger, Herlene Righter, MD;  Location: [facility] ORS;  Service: General;;  Cystorrhaphy with Dr Selma and Dr Elisabeth   BOWEL RESECTION N/A 03/03/2024   Procedure: EXCISION, SMALL INTESTINE;  Surgeon: Ann Fine, MD;  Location: [facility] OR;  Service: General;  Laterality: N/A;   BREAST LUMPECTOMY Right 2014   BREAST LUMPECTOMY  2013   right   CATARACT EXTRACTION W/ INTRAOCULAR LENS IMPLANT Bilateral    COLONOSCOPY  08/27/2012   [place]    INGUINAL HERNIA REPAIR N/A 03/03/2024   Procedure: REPAIR,  HERNIA, INGUINAL/FEMORAL, ADULT;  Surgeon: Ann Fine, MD;  Location: [facility] OR;  Service: General;  Laterality: N/A;  LEFT INGUINAL HERNIA  REPAIR POSSIBLE LAPAROTOMY WITH SMALL BOWEL RESECTION   INSERTION OF MESH Left 03/03/2024   Procedure: INSERTION OF VICRYL MESH, LEFT GROIN;  Surgeon: Ann Fine, MD;   Location: [facility] OR;  Service: General;  Laterality: Left;   KNEE ARTHROSCOPY     left   LAPAROSCOPIC ABDOMINAL EXPLORATION N/A 03/03/2024   Procedure: EXPLORATION, ABDOMEN, LAPAROSCOPIC;  Surgeon: Ann Fine, MD;  Location: [facility] OR;  Service: General;  Laterality: N/A;   LAPAROTOMY N/A 03/19/2024   Procedure: LAPAROTOMY, EXPLORATORY;  Surgeon: Stevie Herlene Righter, MD;  Location: [facility] ORS;  Service: General;  Laterality: N/A;  Dr. Selma or Dr. Elisabeth will assist   LASIK Bilateral    REDUCTION MAMMAPLASTY Bilateral 1985   anchor scar    VAGINAL HYSTERECTOMY  2002    Allergies  Allergen Reactions   Bee Venom Anaphylaxis   Sporanos [Itraconazole] Anaphylaxis   Tamoxifen Anaphylaxis    Gave her a blood clot   Anastrozole  Other (See Comments)    Unknown, pt was unaware of this allergy    Sulfa Antibiotics Nausea And Vomiting    Outpatient Encounter Medications as of 03/25/2024  Medication Sig   acetaminophen  (TYLENOL ) 500 MG tablet Take 2 tablets (1,000 mg total) by mouth every 6 (six) hours as needed.   albuterol  (VENTOLIN  HFA) 108 (90 Base) MCG/ACT inhaler Use 2 puffs 2x a day prn Inhalation as directed 30 days   amoxicillin -clavulanate (AUGMENTIN ) 875-125 MG tablet Take 1 tablet by mouth 2 (two) times daily for 7 days.   buPROPion  (WELLBUTRIN  XL) 300 MG 24 hr tablet Take 1 tablet (300 mg total) by mouth in the morning.   busPIRone  (BUSPAR ) 5 MG tablet Take 1 tablet (5 mg total) by mouth daily.   diclofenac Sodium (VOLTAREN) 1 % GEL Apply 1 Application topically daily as needed (pain).   EPINEPHrine  (EPIPEN  2-PAK) 0.3 mg/0.3 mL IJ SOAJ injection as directed Injection daily 1 days   EPINEPHrine  0.3 mg/0.3 mL IJ SOAJ injection Inject 0.3 mg into the muscle as needed for anaphylaxis.   gabapentin  (NEURONTIN ) 100 MG capsule Take 2 capsules (200 mg total) by mouth 3 (three) times daily for 7 days.   gabapentin  (NEURONTIN ) 300 MG capsule Take 300 mg by mouth 3 (three) times daily.   ibuprofen   (ADVIL ) 200 MG tablet Take 3 tablets (600 mg total) by mouth every 8 (eight) hours as needed for moderate pain (pain score 4-6).   methocarbamol  (ROBAXIN ) 500 MG tablet Take 1 tablet (500 mg total) by mouth every 6 (six) hours as needed for muscle spasms.   omeprazole  (PRILOSEC) 40 MG capsule Take 1 capsule (40 mg total) by mouth daily.   polyethylene glycol (MIRALAX  / GLYCOLAX ) 17 g packet Take 17 g (one packet dissolved in liquid) by mouth daily.   senna-docusate (SENOKOT-S) 8.6-50 MG tablet Take 1 tablet by mouth at bedtime.   triamcinolone  cream (KENALOG ) 0.1 % Apply 1 application on the skin twice a day Apply thin layer to affected areas twice a day x 1-3 weeks as needed.   valACYclovir  (VALTREX ) 1000 MG tablet Take 1 tablet (1,000 mg total) by mouth 2 (two) times daily for 4 days as needed for cold sores.   zolpidem  (AMBIEN ) 5 MG tablet Take 10 mg by mouth at bedtime as needed for sleep.   [DISCONTINUED] oxyCODONE  (OXY IR/ROXICODONE ) 5 MG immediate release tablet Take 1  tablet (5 mg total) by mouth every 6 (six) hours as needed for severe pain (pain score 7-10) (not relieved by tylenol  or robaxin ).   moxifloxacin  (VIGAMOX ) 0.5 % ophthalmic solution Apply 1 drop to eye. (Patient not taking: Reported on 03/25/2024)   zolpidem  (AMBIEN ) 10 MG tablet Take 10 mg by mouth at bedtime as needed for sleep. (Patient not taking: Reported on 03/25/2024)   No facility-administered encounter medications on file as of 03/25/2024.    Review of Systems  Constitutional:  Negative for fatigue and fever.  HENT:  Negative for sore throat and trouble swallowing.   Eyes:  Negative for visual disturbance.  Respiratory:  Negative for cough and shortness of breath.   Cardiovascular:  Negative for chest pain and leg swelling.  Gastrointestinal:  Positive for abdominal pain. Negative for abdominal distention, constipation and diarrhea.  Genitourinary:  Negative for hematuria.  Musculoskeletal:  Positive for gait  problem.  Skin:  Positive for wound.  Neurological:  Positive for weakness. Negative for dizziness and headaches.  Psychiatric/Behavioral:  Positive for dysphoric mood. Negative for confusion and sleep disturbance. The patient is not nervous/anxious.     Immunization History  Administered Date(s) Administered   Influenza, High Dose Seasonal PF 05/27/2017, 07/03/2022   Influenza, Quadrivalent, Recombinant, Inj, Pf 04/13/2019, 06/03/2023   Influenza-Unspecified 08/27/2016, 04/15/2018   MMR 07/05/2019   Moderna Covid-19 Vaccine Bivalent Booster 43yrs & up 06/12/2021   Pfizer Covid-19 Vaccine Bivalent Booster 63yrs & up 04/25/2020   Pneumococcal Conjugate-13 09/27/2014   Pneumococcal Polysaccharide-23 08/27/2010   Pneumococcal-Unspecified 08/28/2015   Respiratory Syncytial Virus Vaccine ,Recomb Aduvanted(Arexvy ) 06/21/2023   Tdap 04/15/2018   Unspecified SARS-COV-2 Vaccination 09/08/2019, 10/07/2019, 09/08/2020   Zoster Recombinant(Shingrix) 12/11/2016, 02/26/2017   Zoster, Live 08/27/2012   Pertinent  Health Maintenance Due  Topic Date Due   INFLUENZA VACCINE  03/27/2024   DEXA SCAN  Completed      01/02/2017   11:25 AM 03/05/2017    3:19 PM 05/27/2017    1:40 PM 10/27/2018    1:36 PM 08/30/2021    7:19 PM  Fall Risk  Falls in the past year? No  No  No     (RETIRED) Patient Fall Risk Level    Low fall risk  Low fall risk      Data saved with a previous flowsheet row definition   Functional Status Survey:    Vitals:   03/25/24 1210  BP: 109/60  Pulse: 61  Resp: 14  Temp: 98.1 F (36.7 C)  SpO2: 96%  Weight: 139 lb 6.4 oz (63.2 kg)  Height: 5' 4 (1.626 m)   Body mass index is 23.93 kg/m. Physical Exam Vitals reviewed.  Constitutional:      General: She is not in acute distress. HENT:     Head: Normocephalic.  Eyes:     General:        Right eye: No discharge.        Left eye: No discharge.  Cardiovascular:     Rate and Rhythm: Normal rate and regular rhythm.      Pulses: Normal pulses.     Heart sounds: Normal heart sounds.  Pulmonary:     Effort: Pulmonary effort is normal.     Breath sounds: Normal breath sounds.  Abdominal:     General: Bowel sounds are normal. There is no distension.     Palpations: Abdomen is soft.     Tenderness: There is abdominal tenderness. There is no guarding.     Comments:  Left lower abdominal incision CDI, small area of serosanguinous drainage to bandage, mild tenderness to left lower abdomen with light palpation, BS + x 4  Genitourinary:    Comments: Foley present, urine yellow clear, UOP adequate Musculoskeletal:     Cervical back: Neck supple.     Right lower leg: No edema.     Left lower leg: No edema.  Skin:    General: Skin is warm.     Capillary Refill: Capillary refill takes less than 2 seconds.  Neurological:     General: No focal deficit present.     Mental Status: She is alert and oriented to person, place, and time.     Gait: Gait abnormal.  Psychiatric:        Mood and Affect: Mood normal.     Labs reviewed: Recent Labs    03/09/24 0714 03/10/24 0637 03/18/24 2210 03/22/24 0536 03/23/24 0510 03/24/24 0517  NA  --   --    < > 137 139 138  K  --   --    < > 4.5 4.5 4.3  CL  --   --    < > 103 105 107  CO2  --   --    < > 28 25 24   GLUCOSE  --   --    < > 89 98 87  BUN  --   --    < > 16 17 15   CREATININE  --   --    < > 0.75 0.63 0.78  CALCIUM  --   --    < > 9.1 9.1 8.8*  MG 2.1 2.0  --  2.3  --   --   PHOS 3.7 3.0  --  3.8  --   --    < > = values in this interval not displayed.   Recent Labs    03/02/24 2228 03/05/24 1511 03/18/24 2210  AST 14*  --  12*  ALT 12  --  17  ALKPHOS 59  --  65  BILITOT 0.4  --  0.7  PROT 7.4  --  6.8  ALBUMIN 4.4 3.0* 3.0*   Recent Labs    03/18/24 2210 03/19/24 0130 03/19/24 0531 03/22/24 0536 03/23/24 0510 03/24/24 0517  WBC 7.7 6.0   < > 6.1 6.1 7.0  NEUTROABS 4.7 3.9  --   --   --   --   HGB 6.8* 10.2*   < > 10.5* 11.1* 11.1*   HCT 20.9* 32.2*   < > 32.9* 34.3* 35.3*  MCV 92.5 92.3   < > 93.5 93.7 95.1  PLT 653* 474*   < > 458* 456* 476*   < > = values in this interval not displayed.   Lab Results  Component Value Date   TSH 2.56 05/27/2017   No results found for: HGBA1C Lab Results  Component Value Date   CHOL 185 10/24/2015   HDL 67 10/24/2015   LDLCALC 126 10/24/2015   TRIG 61 10/24/2015    Significant Diagnostic Results in last 30 days:  CT ABDOMEN PELVIS W CONTRAST Result Date: 03/24/2024 CLINICAL DATA:  post op pain.  Abdominal pain. EXAM: CT ABDOMEN AND PELVIS WITH CONTRAST TECHNIQUE: Multidetector CT imaging of the abdomen and pelvis was performed using the standard protocol following bolus administration of intravenous contrast. RADIATION DOSE REDUCTION: This exam was performed according to the departmental dose-optimization program which includes automated exposure control, adjustment of the mA and/or kV according to  patient size and/or use of iterative reconstruction technique. CONTRAST:  OMNIPAQUE  IOHEXOL  300 MG/ML  SOLN COMPARISON:  CT scan abdomen and pelvis from 03/19/2024. FINDINGS: Lower chest: There are patchy atelectatic changes in the visualized lung bases. No overt consolidation. No pleural effusion. The heart is normal in size. No pericardial effusion. Hepatobiliary: The liver is normal in size. Non-cirrhotic configuration. No suspicious mass. There is an ill-defined sub 5 mm hypoattenuating focus in the right hepatic lobe, which is too small to adequately characterize. No intrahepatic or extrahepatic bile duct dilation. No calcified gallstones. Normal gallbladder wall thickness. No pericholecystic inflammatory changes. Pancreas: Unremarkable. No pancreatic ductal dilatation or surrounding inflammatory changes. Spleen: Within normal limits. No focal lesion. Adrenals/Urinary Tract: Adrenal glands are unremarkable. No suspicious renal mass. No hydronephrosis. No renal or ureteric calculi.  Urinary bladder is decompressed secondary to Foley catheter. Redemonstration of sinus tract extending from the anterior wall of the urinary bladder with a smaller (approximately 2.7 x 4.8 cm), incomplete walled-off collection in the space of Retzius/extra peritoneum as well as sinus tract extending through the left lower quadrant anterior abdominal wall (series 2, image 62), with resultant 3.5 x 10.9 cm walled-off collection in the subcutaneous tissue. Stomach/Bowel: Enteroenteric anastomosis noted in the midline anterior portion of lower abdomen. No disproportionate dilation of the small or large bowel loops. No evidence of abnormal bowel wall thickening or inflammatory changes. The appendix is unremarkable. Vascular/Lymphatic: No ascites or pneumoperitoneum. No abdominal or pelvic lymphadenopathy, by size criteria. No aneurysmal dilation of the major abdominal arteries. There are mild peripheral atherosclerotic vascular calcifications of the aorta and its major branches. Reproductive: The uterus is surgically absent. No large adnexal mass. Other: There is mild anasarca. Musculoskeletal: No suspicious osseous lesions. There are mild - moderate multilevel degenerative changes in the visualized spine. IMPRESSION: 1. Redemonstration of sinus tract extending from the anterior wall of the urinary bladder with a smaller (approximately 2.7 x 4.8 cm), incomplete walled-off collection in the Space of Retzius/extra peritoneum. The sinus tract extends through the left lower quadrant anterior abdominal wall, with associated 3.5 x 10.9 cm walled-off collection in the anterior abdominal wall subcutaneous tissue. 2. Multiple other nonacute observations, as described above. Aortic Atherosclerosis (ICD10-I70.0). Electronically Signed   By: Ree Molt M.D.   On: 03/24/2024 15:23   DG Abd Portable 1V Result Date: 03/21/2024 CLINICAL DATA:  Two days postop abdominal surgery with abdominal pain, initial encounter EXAM: PORTABLE  ABDOMEN - 1 VIEW COMPARISON:  03/05/2024 FINDINGS: Scattered large and small bowel gas is noted. Scattered fecal material is noted. No constipation or obstructive changes are seen. No free air is noted. Degenerative changes of lumbar spine are seen. IMPRESSION: No obstructive changes are noted.  No free air is seen. Electronically Signed   By: Oneil Devonshire M.D.   On: 03/21/2024 22:43   CT CYSTOGRAM PELVIS Result Date: 03/19/2024 EXAM: CT UROGRAM 03/19/2024 02:26:42 AM TECHNIQUE: CT of the abdomen and pelvis was performed before and after the administration of intravenous contrast as per CT urogram protocol. Multiplanar reformatted images as well as MIP urogram images are provided for review. Automated exposure control, iterative reconstruction, and/or weight based adjustment of the mA/kV was utilized to reduce the radiation dose to as low as reasonably achievable. COMPARISON: CT abdomen/pelvis earlier today. CLINICAL HISTORY: Bladder diverticulum. S/p left inguinal hernia surgery, abnormality of the bladder seen on an earlier CT; 100 mL Omni via urinary catheter. FINDINGS: LOWER CHEST: No acute abnormality. LIVER: The liver is unremarkable.  GALLBLADDER AND BILE DUCTS: Gallbladder is unremarkable. No biliary ductal dilatation. SPLEEN: No acute abnormality. PANCREAS: No acute abnormality. ADRENAL GLANDS: No acute abnormality. KIDNEYS, URETERS AND BLADDER: No stones in the kidneys or ureters. No hydronephrosis. No perinephric or periureteral stranding. High density contrast administered via indwelling Foley catheter extravasates into the extraperitoneal space anterior to the bladder (image 6). Associated frank perforation of the anterior bladder (image 41). Additional contrast within the 3.5 x 8.1 cm fluid collection in the left lower anterior pelvic wall (image 7), previously demonstrated to communicate with the extraperitoneal space. GI AND BOWEL: Stomach demonstrates no acute abnormality. There is no bowel  obstruction. PERITONEUM AND RETROPERITONEUM: No ascites. No free air. VASCULATURE: Aorta is normal in caliber. LYMPH NODES: No lymphadenopathy. REPRODUCTIVE ORGANS: No acute abnormality. BONES AND SOFT TISSUES: No acute osseous abnormality. No focal soft tissue abnormality. IMPRESSION: 1. Frank perforation of the anterior bladder with associated extraperitoneal contrast extravasation. 2. Contrast within a 3.5 x 8.1 cm fluid collection in the left lower anterior pelvic wall, previously demonstrated to communicate with the extraperitoneal space. Electronically signed by: Pinkie Pebbles MD 03/19/2024 02:31 AM EDT RP Workstation: [id number]   CT ABDOMEN PELVIS W CONTRAST Result Date: 03/19/2024 EXAM: CT ABDOMEN AND PELVIS WITH CONTRAST 03/19/2024 12:16:32 AM TECHNIQUE: CT of the abdomen and pelvis was performed with the administration of intravenous contrast. Multiplanar reformatted images are provided for review. Automated exposure control, iterative reconstruction, and/or weight based adjustment of the mA/kV was utilized to reduce the radiation dose to as low as reasonably achievable. COMPARISON: 03/05/2024 CLINICAL HISTORY: LLQ abdominal pain. left lower quadrant abdominal pain for about 1 day. She had abdominal hernia surgery 12 days ago. EMS report the site is swollen, but the incision looks normal, hx of breast ca, GFR>60. FINDINGS: LOWER CHEST: No acute abnormality. LIVER: The liver is unremarkable. GALLBLADDER AND BILE DUCTS: Gallbladder is unremarkable. No biliary ductal dilatation. SPLEEN: No acute abnormality. PANCREAS: No acute abnormality. ADRENAL GLANDS: No acute abnormality. KIDNEYS, URETERS AND BLADDER: No stones in the kidneys or ureters. No hydronephrosis. No perinephric or periureteral stranding. Frank anterior bladder perforation (image 59) with associated 2.3 x 7.0 cm walled off fluid collection anteriorly in the extraperitoneal space (image 57) directly communicating with an additional 3.6 x  8.8 cm fluid collection in the left lower anterior abdominal wall (image 60). GI AND BOWEL: Small bowel resection with suture line in the left mid/lower abdomen. Normal appendix (image 45). No bowel obstruction. No bowel wall thickening. PERITONEUM AND RETROPERITONEUM: No free air. VASCULATURE: Atherosclerotic calcifications of the abdominal aorta and branch vessels, although patent. LYMPH NODES: No lymphadenopathy. REPRODUCTIVE ORGANS: Status post hysterectomy. BONES AND SOFT TISSUES: Mild degenerative changes of the lumbar spine. No acute osseous abnormality. No focal soft tissue abnormality. IMPRESSION: 1. Frank anterior bladder perforation. 2. Associated 2.3 x 7.0 cm walled off fluid collection in the extraperitoneal space. 3. Direct communication with additional 3.6 x 8.8 cm fluid collection in the left lower anterior abdominal wall. 4. Surgical consultation is advised. Electronically signed by: Pinkie Pebbles MD 03/19/2024 12:25 AM EDT RP Workstation: [id number]   CT ABDOMEN PELVIS WO CONTRAST Result Date: 03/05/2024 CLINICAL DATA:  Postop day 2 status post hernia repair and bowel resection with abdominal pain, urinary retention, and abdominal and labial swelling EXAM: CT ABDOMEN AND PELVIS WITHOUT CONTRAST TECHNIQUE: Multidetector CT imaging of the abdomen and pelvis was performed following the standard protocol without IV contrast. RADIATION DOSE REDUCTION: This exam was performed according to the departmental dose-optimization  program which includes automated exposure control, adjustment of the mA and/or kV according to patient size and/or use of iterative reconstruction technique. COMPARISON:  CT abdomen and pelvis dated 03/03/2024 FINDINGS: Lower chest: New left lower lobe consolidation and tree-in-bud nodules. Trace right pleural effusion. Partially imaged heart size is normal. Coronary artery calcifications. Hepatobiliary: No focal hepatic lesions. No intra or extrahepatic biliary ductal dilation.  Vicariously excreted contrast material within the gallbladder. Pancreas: No focal lesions or main ductal dilation. Spleen: Normal in size without focal abnormality. Adrenals/Urinary Tract: No adrenal nodules. No suspicious renal mass on this noncontrast enhanced examination or hydronephrosis. Punctate nonobstructing left lower pole stone. Decompressed urinary bladder with catheter in-situ. Stomach/Bowel: Normal appearance of the stomach. Midline small bowel anastomosis deep to the level of the umbilicus. No evidence of bowel wall thickening, distention, or inflammatory changes. Normal appendix. Vascular/Lymphatic: Aortic atherosclerosis. No enlarged abdominal or pelvic lymph nodes. Reproductive: No adnexal masses. 2.1 x 1.8 cm hyperattenuating focus within the left perineal region (3:83), likely subtly present on prior examination and may represent a Bartholin cyst. Other: Small volume intraperitoneal free air. Scattered foci of subcutaneous emphysema in the left hemiabdomen, most notable in the left inguinal region, where there is small loculated fluid measuring 4.0 x 1.8 cm (3:70). Moderate volume presacral and pelvic free fluid. Musculoskeletal: No acute or abnormal lytic or blastic osseous lesions. Multilevel degenerative changes of the partially imaged thoracic and lumbar spine. Extensive body wall edema along bilateral flank and anterior lower abdomen extending into the labia and proximal thighs. IMPRESSION: 1. Small volume intraperitoneal free air, likely postsurgical. 2. Scattered foci of subcutaneous emphysema in the left hemiabdomen, most notable in the left inguinal region, likely postsurgical. Small loculated fluid in the left inguinal subcutaneous soft tissues, which may represent a postoperative seroma or hematoma. 3. Moderate volume presacral and pelvic free fluid, likely postsurgical. 4. Extensive body wall edema along bilateral flank and anterior lower abdomen extending into the labia and proximal  thighs. 5. New left lower lobe consolidation and tree-in-bud nodules, likely aspiration or pneumonia. 6. Trace right pleural effusion. 7. Aortic Atherosclerosis (ICD10-I70.0). Coronary artery calcifications. Assessment for potential risk factor modification, dietary therapy or pharmacologic therapy may be warranted, if clinically indicated. Electronically Signed   By: Limin  Xu M.D.   On: 03/05/2024 14:23   DG Abd Portable 1V Result Date: 03/05/2024 CLINICAL DATA:  Postoperative nausea vomiting. EXAM: PORTABLE ABDOMEN - 1 VIEW COMPARISON:  03/04/2024. FINDINGS: Nonobstructive bowel gas pattern. Mild gaseous distension of small bowel loops in the left abdomen. No evidence of free air. Visualized osseous structures are unchanged. IMPRESSION: Nonobstructive bowel gas pattern. Mild gaseous distension of small bowel loops in the left abdomen. Electronically Signed   By: Harrietta Sherry M.D.   On: 03/05/2024 11:24   DG Abd Portable 1V Result Date: 03/04/2024 CLINICAL DATA:  [id number] Vomiting [id number].  Abdomen pain. EXAM: PORTABLE ABDOMEN - 1 VIEW COMPARISON:  None Available. FINDINGS: The bowel gas pattern is non-obstructive.  No abnormal stool burden. No evidence of pneumoperitoneum, within the limitations of a supine film. No acute osseous abnormalities. The soft tissues are within normal limits. Surgical changes, devices, tubes and lines: None. IMPRESSION: Nonobstructive bowel gas pattern. Electronically Signed   By: Ree Molt M.D.   On: 03/04/2024 12:00   CT ABDOMEN PELVIS W CONTRAST Result Date: 03/03/2024 CLINICAL DATA:  Bowel obstruction suspected. Left inguinal hernia. Lower abdominal and pelvic pain. Difficulty urinating. Nausea and vomiting. EXAM: CT ABDOMEN AND PELVIS WITH CONTRAST  TECHNIQUE: Multidetector CT imaging of the abdomen and pelvis was performed using the standard protocol following bolus administration of intravenous contrast. RADIATION DOSE REDUCTION: This exam was performed according to  the departmental dose-optimization program which includes automated exposure control, adjustment of the mA and/or kV according to patient size and/or use of iterative reconstruction technique. CONTRAST:  85mL OMNIPAQUE  IOHEXOL  300 MG/ML  SOLN COMPARISON:  Ultrasound abdomen 08/09/2022 FINDINGS: Lower chest: Lung bases are clear. Hepatobiliary: No focal liver abnormality is seen. No gallstones, gallbladder wall thickening, or biliary dilatation. Pancreas: Unremarkable. No pancreatic ductal dilatation or surrounding inflammatory changes. Spleen: Normal in size without focal abnormality. Adrenals/Urinary Tract: Adrenal glands are unremarkable. Kidneys are normal, without renal calculi, focal lesion, or hydronephrosis. Bladder is unremarkable. Stomach/Bowel: Small left inguinal hernia containing a loop of small bowel with proximal dilated bowel loops with air-fluid levels consistent with proximal obstruction. Remainder of the small bowel, stomach, and colon are decompressed. No bowel wall thickening, pneumatosis, or portal venous gas. Appendix is normal. Vascular/Lymphatic: Aortic atherosclerosis. No enlarged abdominal or pelvic lymph nodes. Reproductive: Status post hysterectomy. No adnexal masses. Other: No free air or free fluid in the abdomen. Musculoskeletal: Degenerative changes in the spine. No acute bony abnormalities. IMPRESSION: 1. Left inguinal hernia containing small bowel with left lower quadrant anterior dilated small bowel with gas and fluid levels consistent with small-bowel obstruction. No ischemic changes are identified. 2. Aortic atherosclerosis. Electronically Signed   By: Elsie Gravely M.D.   On: 03/03/2024 02:50    Assessment/Plan 1. Extraperitoneal rupture of bladder (Primary) - complication from hernia repair 07/08  - hospitalized 07/23-07/29 - CT abdomen/ cystogram confirmed bladder rupture - 07/24 underwent bladder repair> foley and JP placed - foley with adequate UOP - LLQ with  mild tenderness> ? Bladder spasms  - cont oxycodone , tylenol  and gabapentin   - f/u with urology 08/11  2. S/P hernia repair - femoral hernia repair 07/08 due to SBO - see above - mild tenderness to LLQ - mild serosanguinous drainage on bandage, afebrile - JP drain removed at discharge - f/u scheduled 08/19  3. Gastroesophageal reflux disease without esophagitis - cont omeprazole   4. Depression, unspecified depression type - cont Wellbutrin   5. Primary insomnia - cont Ambien  prn    Family/ staff Communication: plan discussed with patient and nurse  Labs/tests ordered:  none

## 2024-03-27 ENCOUNTER — Other Ambulatory Visit (HOSPITAL_COMMUNITY): Payer: Self-pay | Admitting: Student

## 2024-03-27 ENCOUNTER — Other Ambulatory Visit (HOSPITAL_COMMUNITY)

## 2024-03-27 ENCOUNTER — Ambulatory Visit (HOSPITAL_BASED_OUTPATIENT_CLINIC_OR_DEPARTMENT_OTHER)
Admission: RE | Admit: 2024-03-27 | Discharge: 2024-03-27 | Disposition: A | Discharge status: Home / Self Care | Source: Ambulatory Visit | Attending: Student | Admitting: Student

## 2024-03-27 DIAGNOSIS — K4190 Unilateral femoral hernia, without obstruction or gangrene, not specified as recurrent: Secondary | ICD-10-CM | POA: Diagnosis not present

## 2024-03-27 DIAGNOSIS — R188 Other ascites: Secondary | ICD-10-CM

## 2024-03-27 DIAGNOSIS — Z9889 Other specified postprocedural states: Secondary | ICD-10-CM | POA: Diagnosis not present

## 2024-03-27 DIAGNOSIS — Z8719 Personal history of other diseases of the digestive system: Secondary | ICD-10-CM | POA: Insufficient documentation

## 2024-03-27 DIAGNOSIS — Z90710 Acquired absence of both cervix and uterus: Secondary | ICD-10-CM | POA: Diagnosis not present

## 2024-03-27 DIAGNOSIS — R2689 Other abnormalities of gait and mobility: Secondary | ICD-10-CM | POA: Diagnosis not present

## 2024-03-27 DIAGNOSIS — R2681 Unsteadiness on feet: Secondary | ICD-10-CM | POA: Diagnosis not present

## 2024-03-30 ENCOUNTER — Non-Acute Institutional Stay (SKILLED_NURSING_FACILITY): Payer: Self-pay | Admitting: Adult Health

## 2024-03-30 ENCOUNTER — Telehealth (HOSPITAL_COMMUNITY): Payer: Self-pay

## 2024-03-30 ENCOUNTER — Encounter: Payer: Self-pay | Admitting: Adult Health

## 2024-03-30 DIAGNOSIS — R188 Other ascites: Secondary | ICD-10-CM

## 2024-03-30 DIAGNOSIS — Z8719 Personal history of other diseases of the digestive system: Secondary | ICD-10-CM

## 2024-03-30 DIAGNOSIS — R100 Acute abdomen: Secondary | ICD-10-CM | POA: Diagnosis not present

## 2024-03-30 DIAGNOSIS — R2681 Unsteadiness on feet: Secondary | ICD-10-CM | POA: Diagnosis not present

## 2024-03-30 DIAGNOSIS — Z9889 Other specified postprocedural states: Secondary | ICD-10-CM

## 2024-03-30 DIAGNOSIS — I959 Hypotension, unspecified: Secondary | ICD-10-CM

## 2024-03-30 DIAGNOSIS — K4190 Unilateral femoral hernia, without obstruction or gangrene, not specified as recurrent: Secondary | ICD-10-CM | POA: Diagnosis not present

## 2024-03-30 DIAGNOSIS — R2689 Other abnormalities of gait and mobility: Secondary | ICD-10-CM | POA: Diagnosis not present

## 2024-03-30 LAB — COMPREHENSIVE METABOLIC PANEL WITH GFR
Albumin: 3.9 (ref 3.5–5.0)
Calcium: 9.6 (ref 8.7–10.7)

## 2024-03-30 LAB — HEPATIC FUNCTION PANEL
ALT: 15 U/L (ref 7–35)
AST: 11 — AB (ref 13–35)
Alkaline Phosphatase: 60 (ref 25–125)
Bilirubin, Total: 0.2

## 2024-03-30 LAB — CBC AND DIFFERENTIAL
HCT: 35 — AB (ref 36–46)
Hemoglobin: 11.4 — AB (ref 12.0–16.0)
Platelets: 464 K/uL — AB (ref 150–400)
WBC: 6.8

## 2024-03-30 LAB — BASIC METABOLIC PANEL WITH GFR
BUN: 11 (ref 4–21)
CO2: 28 — AB (ref 13–22)
Chloride: 103 (ref 99–108)
Creatinine: 0.8 (ref 0.5–1.1)
Glucose: 74
Potassium: 4.5 meq/L (ref 3.5–5.1)
Sodium: 137 (ref 137–147)

## 2024-03-30 LAB — CBC: RBC: 3.9 (ref 3.87–5.11)

## 2024-03-30 NOTE — Progress Notes (Unsigned)
 Location:  [facility] Room Number: 151-P Place of Service:  SNF [phone]) Provider:  Tawni America, NP     Patient Care Team: [name], MD as PCP - General (Internal Medicine) [name], DO as Consulting Physician (Geriatric Medicine) [name], MD as Consulting Physician (Orthopedic Surgery)  Extended Emergency Contact Information Primary Emergency Contact: Littlejohn,Sara  [place] Home Phone: [phone] Relation: Daughter Secondary Emergency Contact: Murphy,Jim Mobile Phone: [phone] Relation: Significant other  Code Status:  DNR Goals of care: Advanced Directive information    03/25/2024   12:55 PM  Advanced Directives  Does Patient Have a Medical Advance Directive? Yes  Type of Estate agent of [place];Living will  Does patient want to make changes to medical advance directive? No - Patient declined  Copy of Healthcare Power of Attorney in Chart? Yes - validated most recent copy scanned in chart (See row information)     Chief Complaint  Patient presents with   Abdominal Pain    Acute visit.    HPI:  The patient presents with concerns for abd discomfort and fluid collection   Back ground: Underwent left femoral hernia repair on 03/03/24 for SBO Continues to have abd pain, present in the ED 03/18/24 07/24 she underwent exploratory laparotomy and bladder repair which involved placing JP drain and foley catheter.  Abdominal fluid collection and associated symptoms - CT scan shows a 3.5 x 10.9 cm walled-off fluid collection in the anterior abdomen with a sinus tract extending to the left lower quadrant, unchanged from previous imaging - Burning sensation in the abdomen - Completing a course of Augmentin  for the wound today - No fever or systemic signs of infection  Hypotension - Recent blood pressure readings: 93/53, 107/67, and 98/64 -no dizziness, bp tends to run on  the low side.   Bowel function - Eating well - Regular bowel movements that are not firm  Continues with a foley catheter.   Past Medical History:  Diagnosis Date   Anxiety    Breast cancer (HCC) 06/27/2012   Right   Cataract    DVT (deep venous thrombosis) (HCC) 06/28/2015   Dyslipidemia    GERD (gastroesophageal reflux disease)    Insomnia    Osteopenia    Overactive bladder    Personal history of radiation therapy    Shingles 06/2016   Thyroid  disease    Past Surgical History:  Procedure Laterality Date   BLADDER REPAIR  03/19/2024   Procedure: REPAIR, BLADDER;  Surgeon: Kinsinger, Herlene Righter, MD;  Location: [facility]S;  Service: General;;  Cystorrhaphy with Dr Selma and Dr Elisabeth   BOWEL RESECTION N/A 03/03/2024   Procedure: EXCISION, SMALL INTESTINE;  Surgeon: Ann Fine, MD;  Location: [facility] OR;  Service: General;  Laterality: N/A;   BREAST LUMPECTOMY Right 2014   BREAST LUMPECTOMY  2013   right   CATARACT EXTRACTION W/ INTRAOCULAR LENS IMPLANT Bilateral    COLONOSCOPY  08/27/2012   [place]    INGUINAL HERNIA REPAIR N/A 03/03/2024   Procedure: REPAIR, HERNIA, INGUINAL/FEMORAL, ADULT;  Surgeon: Ann Fine, MD;  Location: [facility] OR;  Service: General;  Laterality: N/A;  LEFT INGUINAL HERNIA  REPAIR POSSIBLE LAPAROTOMY WITH SMALL BOWEL RESECTION   INSERTION OF MESH Left 03/03/2024   Procedure: INSERTION OF VICRYL MESH, LEFT GROIN;  Surgeon: Ann Fine, MD;  Location: [facility] OR;  Service: General;  Laterality: Left;   KNEE ARTHROSCOPY     left   LAPAROSCOPIC ABDOMINAL EXPLORATION  N/A 03/03/2024   Procedure: EXPLORATION, ABDOMEN, LAPAROSCOPIC;  Surgeon: Ann Fine, MD;  Location: [facility] OR;  Service: General;  Laterality: N/A;   LAPAROTOMY N/A 03/19/2024   Procedure: LAPAROTOMY, EXPLORATORY;  Surgeon: Stevie Herlene Righter, MD;  Location: [facility];  Service: General;  Laterality: N/A;  Dr. Selma or Dr. Elisabeth will assist   LASIK Bilateral    REDUCTION MAMMAPLASTY Bilateral 1985    anchor scar    VAGINAL HYSTERECTOMY  2002    Allergies  Allergen Reactions   Bee Venom Anaphylaxis   Sporanos [Itraconazole] Anaphylaxis   Tamoxifen Anaphylaxis    Gave her a blood clot   Anastrozole  Other (See Comments)    Unknown, pt was unaware of this allergy    Sulfa Antibiotics Nausea And Vomiting    Outpatient Encounter Medications as of 03/30/2024  Medication Sig   acetaminophen  (TYLENOL ) 500 MG tablet Take 2 tablets (1,000 mg total) by mouth every 6 (six) hours as needed.   albuterol  (VENTOLIN  HFA) 108 (90 Base) MCG/ACT inhaler Use 2 puffs 2x a day prn Inhalation as directed 30 days   amoxicillin -clavulanate (AUGMENTIN ) 875-125 MG tablet Take 1 tablet by mouth 2 (two) times daily for 7 days.   buPROPion  (WELLBUTRIN  XL) 300 MG 24 hr tablet Take 1 tablet (300 mg total) by mouth in the morning.   busPIRone  (BUSPAR ) 5 MG tablet Take 1 tablet (5 mg total) by mouth daily.   diclofenac Sodium (VOLTAREN) 1 % GEL Apply 1 Application topically daily as needed (pain).   EPINEPHrine  0.3 mg/0.3 mL IJ SOAJ injection Inject 0.3 mg into the muscle as needed for anaphylaxis.   gabapentin  (NEURONTIN ) 100 MG capsule Take 2 capsules (200 mg total) by mouth 3 (three) times daily for 7 days.   ibuprofen  (ADVIL ) 200 MG tablet Take 3 tablets (600 mg total) by mouth every 8 (eight) hours as needed for moderate pain (pain score 4-6).   methocarbamol  (ROBAXIN ) 500 MG tablet Take 1 tablet (500 mg total) by mouth every 6 (six) hours as needed for muscle spasms.   omeprazole  (PRILOSEC) 40 MG capsule Take 1 capsule (40 mg total) by mouth daily.   oxyCODONE  (OXY IR/ROXICODONE ) 5 MG immediate release tablet Take 1 tablet (5 mg total) by mouth every 6 (six) hours as needed for severe pain (pain score 7-10) (not relieved by tylenol  or robaxin ).   polyethylene glycol (MIRALAX  / GLYCOLAX ) 17 g packet Take 17 g (one packet dissolved in liquid) by mouth daily.   senna-docusate (SENOKOT-S) 8.6-50 MG tablet Take 1  tablet by mouth at bedtime.   triamcinolone  cream (KENALOG ) 0.1 % Apply 1 application on the skin twice a day Apply thin layer to affected areas twice a day x 1-3 weeks as needed.   valACYclovir  (VALTREX ) 1000 MG tablet Take 1 tablet (1,000 mg total) by mouth 2 (two) times daily for 4 days as needed for cold sores. (Patient taking differently: Take 1,000 mg by mouth 2 (two) times daily as needed (For cold sores.).)   zolpidem  (AMBIEN ) 5 MG tablet Take 10 mg by mouth at bedtime as needed for sleep.   EPINEPHrine  (EPIPEN  2-PAK) 0.3 mg/0.3 mL IJ SOAJ injection as directed Injection daily 1 days (Patient not taking: Reported on 03/30/2024)   gabapentin  (NEURONTIN ) 300 MG capsule Take 300 mg by mouth 3 (three) times daily. (Patient not taking: Reported on 03/30/2024)   No facility-administered encounter medications on file as of 03/30/2024.    Review of Systems  Constitutional:  Negative for activity  change, appetite change, chills, diaphoresis, fatigue and fever.  HENT:  Negative for congestion.   Respiratory:  Negative for cough, shortness of breath and wheezing.   Cardiovascular:  Negative for chest pain and leg swelling.  Gastrointestinal:  Positive for abdominal pain. Negative for abdominal distention, constipation, diarrhea, nausea and vomiting.  Genitourinary:  Negative for dysuria, flank pain and pelvic pain.       Has foley  Musculoskeletal:  Negative for back pain, gait problem, myalgias and neck pain.  Skin:  Negative for rash.  Neurological:  Negative for dizziness and weakness.  Psychiatric/Behavioral:  Negative for confusion.     Immunization History  Administered Date(s) Administered   Influenza, High Dose Seasonal PF 05/27/2017, 07/03/2022   Influenza, Quadrivalent, Recombinant, Inj, Pf 04/13/2019, 06/03/2023   Influenza-Unspecified 08/27/2016, 04/15/2018   MMR 07/05/2019   Moderna Covid-19 Vaccine Bivalent Booster 57yrs & up 06/12/2021   Pfizer Covid-19 Vaccine Bivalent Booster  33yrs & up 04/25/2020   Pneumococcal Conjugate-13 09/27/2014   Pneumococcal Polysaccharide-23 08/27/2010   Pneumococcal-Unspecified 08/28/2015   Respiratory Syncytial Virus Vaccine ,Recomb Aduvanted(Arexvy ) 06/21/2023   Tdap 04/15/2018   Unspecified SARS-COV-2 Vaccination 09/08/2019, 10/07/2019, 09/08/2020   Zoster Recombinant(Shingrix) 12/11/2016, 02/26/2017   Zoster, Live 08/27/2012   Pertinent  Health Maintenance Due  Topic Date Due   INFLUENZA VACCINE  03/27/2024   DEXA SCAN  Completed      03/05/2017    3:19 PM 05/27/2017    1:40 PM 10/27/2018    1:36 PM 08/30/2021    7:19 PM 03/26/2024    3:15 PM  Fall Risk  Falls in the past year? No  No    0  Was there an injury with Fall?     0  Fall Risk Category Calculator     0  (RETIRED) Patient Fall Risk Level   Low fall risk  Low fall risk    Patient at Risk for Falls Due to     History of fall(s);Impaired balance/gait  Fall risk Follow up     Falls evaluation completed;Education provided     Data saved with a previous flowsheet row definition   Functional Status Survey:    Vitals:   03/30/24 1057  BP: 94/60  SpO2: 98%  Weight: 138 lb 6.4 oz (62.8 kg)  Height: 5' 4 (1.626 m)   Body mass index is 23.76 kg/m. Physical Exam Vitals reviewed.  Constitutional:      General: She is not in acute distress.    Appearance: She is not diaphoretic.  HENT:     Head: Normocephalic and atraumatic.  Neck:     Vascular: No JVD.  Cardiovascular:     Rate and Rhythm: Normal rate and regular rhythm.     Heart sounds: No murmur heard. Pulmonary:     Effort: Pulmonary effort is normal. No respiratory distress.     Breath sounds: Normal breath sounds. No wheezing.  Abdominal:     General: Bowel sounds are decreased. There is no distension.     Palpations: Abdomen is soft. There is no mass.     Tenderness: There is abdominal tenderness (LLQ). There is no right CVA tenderness, left CVA tenderness, guarding or rebound.  Musculoskeletal:      Right lower leg: No edema.     Left lower leg: No edema.  Skin:    General: Skin is warm and dry.     Comments: Low abd incision with two areas on either side with slough tissue.  Small amt of serosang  drainage. No tenderness or swelling.   Neurological:     Mental Status: She is alert and oriented to person, place, and time.  Psychiatric:        Mood and Affect: Mood normal.     Labs reviewed: Recent Labs    03/09/24 0714 03/10/24 0637 03/18/24 2210 03/22/24 0536 03/23/24 0510 03/24/24 0517  NA  --   --    < > 137 139 138  K  --   --    < > 4.5 4.5 4.3  CL  --   --    < > 103 105 107  CO2  --   --    < > 28 25 24   GLUCOSE  --   --    < > 89 98 87  BUN  --   --    < > 16 17 15   CREATININE  --   --    < > 0.75 0.63 0.78  CALCIUM  --   --    < > 9.1 9.1 8.8*  MG 2.1 2.0  --  2.3  --   --   PHOS 3.7 3.0  --  3.8  --   --    < > = values in this interval not displayed.   Recent Labs    03/02/24 2228 03/05/24 1511 03/18/24 2210  AST 14*  --  12*  ALT 12  --  17  ALKPHOS 59  --  65  BILITOT 0.4  --  0.7  PROT 7.4  --  6.8  ALBUMIN 4.4 3.0* 3.0*   Recent Labs    03/18/24 2210 03/19/24 0130 03/19/24 0531 03/22/24 0536 03/23/24 0510 03/24/24 0517  WBC 7.7 6.0   < > 6.1 6.1 7.0  NEUTROABS 4.7 3.9  --   --   --   --   HGB 6.8* 10.2*   < > 10.5* 11.1* 11.1*  HCT 20.9* 32.2*   < > 32.9* 34.3* 35.3*  MCV 92.5 92.3   < > 93.5 93.7 95.1  PLT 653* 474*   < > 458* 456* 476*   < > = values in this interval not displayed.   Lab Results  Component Value Date   TSH 2.56 05/27/2017   No results found for: HGBA1C Lab Results  Component Value Date   CHOL 185 10/24/2015   HDL 67 10/24/2015   LDLCALC 126 10/24/2015   TRIG 61 10/24/2015    Significant Diagnostic Results in last 30 days:  CT PELVIS WO CONTRAST Result Date: 03/27/2024 CLINICAL DATA:  Possible fluid collection. EXAM: CT PELVIS WITHOUT CONTRAST TECHNIQUE: Multidetector CT imaging of the pelvis was  performed following the standard protocol without intravenous contrast. RADIATION DOSE REDUCTION: This exam was performed according to the departmental dose-optimization program which includes automated exposure control, adjustment of the mA and/or kV according to patient size and/or use of iterative reconstruction technique. COMPARISON:  March 24, 2024. FINDINGS: Urinary Tract: Urinary bladder is decompressed secondary to Foley catheter. There remains complex fluid collection extending from the anterior portion of urinary bladder into anterior pelvis, eventually crossing anterior peritoneal wall into fluid collection measuring 4.4 x 1.9 cm in subcutaneous tissue which extends to cutaneous surface. This may be slightly smaller compared to prior exam, but evaluation is limited due to the lack of intravenous contrast. Bowel:  Unremarkable visualized pelvic bowel loops. Vascular/Lymphatic: Aortic atherosclerosis. No definite adenopathy is noted. Reproductive:  Status post hysterectomy. Other:  No definite hernia is noted.  Musculoskeletal: No suspicious bone lesions identified. IMPRESSION: Continued presence of complex fluid collection extending from anterior portion of decompressed bladder and through anterior peritoneal wall into subcutaneous tissues and extending to cutaneous surface. This appears to be slightly decreased compared to prior exam, although evaluation is limited due to the lack of intravenous contrast. Aortic Atherosclerosis (ICD10-I70.0). Electronically Signed   By: Lynwood Landy Raddle M.D.   On: 03/27/2024 16:03   CT ABDOMEN PELVIS W CONTRAST Result Date: 03/24/2024 CLINICAL DATA:  post op pain.  Abdominal pain. EXAM: CT ABDOMEN AND PELVIS WITH CONTRAST TECHNIQUE: Multidetector CT imaging of the abdomen and pelvis was performed using the standard protocol following bolus administration of intravenous contrast. RADIATION DOSE REDUCTION: This exam was performed according to the departmental  dose-optimization program which includes automated exposure control, adjustment of the mA and/or kV according to patient size and/or use of iterative reconstruction technique. CONTRAST:  OMNIPAQUE  IOHEXOL  300 MG/ML  SOLN COMPARISON:  CT scan abdomen and pelvis from 03/19/2024. FINDINGS: Lower chest: There are patchy atelectatic changes in the visualized lung bases. No overt consolidation. No pleural effusion. The heart is normal in size. No pericardial effusion. Hepatobiliary: The liver is normal in size. Non-cirrhotic configuration. No suspicious mass. There is an ill-defined sub 5 mm hypoattenuating focus in the right hepatic lobe, which is too small to adequately characterize. No intrahepatic or extrahepatic bile duct dilation. No calcified gallstones. Normal gallbladder wall thickness. No pericholecystic inflammatory changes. Pancreas: Unremarkable. No pancreatic ductal dilatation or surrounding inflammatory changes. Spleen: Within normal limits. No focal lesion. Adrenals/Urinary Tract: Adrenal glands are unremarkable. No suspicious renal mass. No hydronephrosis. No renal or ureteric calculi. Urinary bladder is decompressed secondary to Foley catheter. Redemonstration of sinus tract extending from the anterior wall of the urinary bladder with a smaller (approximately 2.7 x 4.8 cm), incomplete walled-off collection in the space of Retzius/extra peritoneum as well as sinus tract extending through the left lower quadrant anterior abdominal wall (series 2, image 62), with resultant 3.5 x 10.9 cm walled-off collection in the subcutaneous tissue. Stomach/Bowel: Enteroenteric anastomosis noted in the midline anterior portion of lower abdomen. No disproportionate dilation of the small or large bowel loops. No evidence of abnormal bowel wall thickening or inflammatory changes. The appendix is unremarkable. Vascular/Lymphatic: No ascites or pneumoperitoneum. No abdominal or pelvic lymphadenopathy, by size criteria.  No aneurysmal dilation of the major abdominal arteries. There are mild peripheral atherosclerotic vascular calcifications of the aorta and its major branches. Reproductive: The uterus is surgically absent. No large adnexal mass. Other: There is mild anasarca. Musculoskeletal: No suspicious osseous lesions. There are mild - moderate multilevel degenerative changes in the visualized spine. IMPRESSION: 1. Redemonstration of sinus tract extending from the anterior wall of the urinary bladder with a smaller (approximately 2.7 x 4.8 cm), incomplete walled-off collection in the Space of Retzius/extra peritoneum. The sinus tract extends through the left lower quadrant anterior abdominal wall, with associated 3.5 x 10.9 cm walled-off collection in the anterior abdominal wall subcutaneous tissue. 2. Multiple other nonacute observations, as described above. Aortic Atherosclerosis (ICD10-I70.0). Electronically Signed   By: Ree Molt M.D.   On: 03/24/2024 15:23   DG Abd Portable 1V Result Date: 03/21/2024 CLINICAL DATA:  Two days postop abdominal surgery with abdominal pain, initial encounter EXAM: PORTABLE ABDOMEN - 1 VIEW COMPARISON:  03/05/2024 FINDINGS: Scattered large and small bowel gas is noted. Scattered fecal material is noted. No constipation or obstructive changes are seen. No free air is noted. Degenerative changes  of lumbar spine are seen. IMPRESSION: No obstructive changes are noted.  No free air is seen. Electronically Signed   By: Oneil Devonshire M.D.   On: 03/21/2024 22:43   CT CYSTOGRAM PELVIS Result Date: 03/19/2024 EXAM: CT UROGRAM 03/19/2024 02:26:42 AM TECHNIQUE: CT of the abdomen and pelvis was performed before and after the administration of intravenous contrast as per CT urogram protocol. Multiplanar reformatted images as well as MIP urogram images are provided for review. Automated exposure control, iterative reconstruction, and/or weight based adjustment of the mA/kV was utilized to reduce the  radiation dose to as low as reasonably achievable. COMPARISON: CT abdomen/pelvis earlier today. CLINICAL HISTORY: Bladder diverticulum. S/p left inguinal hernia surgery, abnormality of the bladder seen on an earlier CT; 100 mL Omni via urinary catheter. FINDINGS: LOWER CHEST: No acute abnormality. LIVER: The liver is unremarkable. GALLBLADDER AND BILE DUCTS: Gallbladder is unremarkable. No biliary ductal dilatation. SPLEEN: No acute abnormality. PANCREAS: No acute abnormality. ADRENAL GLANDS: No acute abnormality. KIDNEYS, URETERS AND BLADDER: No stones in the kidneys or ureters. No hydronephrosis. No perinephric or periureteral stranding. High density contrast administered via indwelling Foley catheter extravasates into the extraperitoneal space anterior to the bladder (image 6). Associated frank perforation of the anterior bladder (image 41). Additional contrast within the 3.5 x 8.1 cm fluid collection in the left lower anterior pelvic wall (image 7), previously demonstrated to communicate with the extraperitoneal space. GI AND BOWEL: Stomach demonstrates no acute abnormality. There is no bowel obstruction. PERITONEUM AND RETROPERITONEUM: No ascites. No free air. VASCULATURE: Aorta is normal in caliber. LYMPH NODES: No lymphadenopathy. REPRODUCTIVE ORGANS: No acute abnormality. BONES AND SOFT TISSUES: No acute osseous abnormality. No focal soft tissue abnormality. IMPRESSION: 1. Frank perforation of the anterior bladder with associated extraperitoneal contrast extravasation. 2. Contrast within a 3.5 x 8.1 cm fluid collection in the left lower anterior pelvic wall, previously demonstrated to communicate with the extraperitoneal space. Electronically signed by: Pinkie Pebbles MD 03/19/2024 02:31 AM EDT RP Workstation: [id number]   CT ABDOMEN PELVIS W CONTRAST Result Date: 03/19/2024 EXAM: CT ABDOMEN AND PELVIS WITH CONTRAST 03/19/2024 12:16:32 AM TECHNIQUE: CT of the abdomen and pelvis was performed with the  administration of intravenous contrast. Multiplanar reformatted images are provided for review. Automated exposure control, iterative reconstruction, and/or weight based adjustment of the mA/kV was utilized to reduce the radiation dose to as low as reasonably achievable. COMPARISON: 03/05/2024 CLINICAL HISTORY: LLQ abdominal pain. left lower quadrant abdominal pain for about 1 day. She had abdominal hernia surgery 12 days ago. EMS report the site is swollen, but the incision looks normal, hx of breast ca, GFR>60. FINDINGS: LOWER CHEST: No acute abnormality. LIVER: The liver is unremarkable. GALLBLADDER AND BILE DUCTS: Gallbladder is unremarkable. No biliary ductal dilatation. SPLEEN: No acute abnormality. PANCREAS: No acute abnormality. ADRENAL GLANDS: No acute abnormality. KIDNEYS, URETERS AND BLADDER: No stones in the kidneys or ureters. No hydronephrosis. No perinephric or periureteral stranding. Frank anterior bladder perforation (image 59) with associated 2.3 x 7.0 cm walled off fluid collection anteriorly in the extraperitoneal space (image 57) directly communicating with an additional 3.6 x 8.8 cm fluid collection in the left lower anterior abdominal wall (image 60). GI AND BOWEL: Small bowel resection with suture line in the left mid/lower abdomen. Normal appendix (image 45). No bowel obstruction. No bowel wall thickening. PERITONEUM AND RETROPERITONEUM: No free air. VASCULATURE: Atherosclerotic calcifications of the abdominal aorta and branch vessels, although patent. LYMPH NODES: No lymphadenopathy. REPRODUCTIVE ORGANS: Status post hysterectomy. BONES  AND SOFT TISSUES: Mild degenerative changes of the lumbar spine. No acute osseous abnormality. No focal soft tissue abnormality. IMPRESSION: 1. Frank anterior bladder perforation. 2. Associated 2.3 x 7.0 cm walled off fluid collection in the extraperitoneal space. 3. Direct communication with additional 3.6 x 8.8 cm fluid collection in the left lower anterior  abdominal wall. 4. Surgical consultation is advised. Electronically signed by: Pinkie Pebbles MD 03/19/2024 12:25 AM EDT RP Workstation: [id number]   CT ABDOMEN PELVIS WO CONTRAST Result Date: 03/05/2024 CLINICAL DATA:  Postop day 2 status post hernia repair and bowel resection with abdominal pain, urinary retention, and abdominal and labial swelling EXAM: CT ABDOMEN AND PELVIS WITHOUT CONTRAST TECHNIQUE: Multidetector CT imaging of the abdomen and pelvis was performed following the standard protocol without IV contrast. RADIATION DOSE REDUCTION: This exam was performed according to the departmental dose-optimization program which includes automated exposure control, adjustment of the mA and/or kV according to patient size and/or use of iterative reconstruction technique. COMPARISON:  CT abdomen and pelvis dated 03/03/2024 FINDINGS: Lower chest: New left lower lobe consolidation and tree-in-bud nodules. Trace right pleural effusion. Partially imaged heart size is normal. Coronary artery calcifications. Hepatobiliary: No focal hepatic lesions. No intra or extrahepatic biliary ductal dilation. Vicariously excreted contrast material within the gallbladder. Pancreas: No focal lesions or main ductal dilation. Spleen: Normal in size without focal abnormality. Adrenals/Urinary Tract: No adrenal nodules. No suspicious renal mass on this noncontrast enhanced examination or hydronephrosis. Punctate nonobstructing left lower pole stone. Decompressed urinary bladder with catheter in-situ. Stomach/Bowel: Normal appearance of the stomach. Midline small bowel anastomosis deep to the level of the umbilicus. No evidence of bowel wall thickening, distention, or inflammatory changes. Normal appendix. Vascular/Lymphatic: Aortic atherosclerosis. No enlarged abdominal or pelvic lymph nodes. Reproductive: No adnexal masses. 2.1 x 1.8 cm hyperattenuating focus within the left perineal region (3:83), likely subtly present on prior  examination and may represent a Bartholin cyst. Other: Small volume intraperitoneal free air. Scattered foci of subcutaneous emphysema in the left hemiabdomen, most notable in the left inguinal region, where there is small loculated fluid measuring 4.0 x 1.8 cm (3:70). Moderate volume presacral and pelvic free fluid. Musculoskeletal: No acute or abnormal lytic or blastic osseous lesions. Multilevel degenerative changes of the partially imaged thoracic and lumbar spine. Extensive body wall edema along bilateral flank and anterior lower abdomen extending into the labia and proximal thighs. IMPRESSION: 1. Small volume intraperitoneal free air, likely postsurgical. 2. Scattered foci of subcutaneous emphysema in the left hemiabdomen, most notable in the left inguinal region, likely postsurgical. Small loculated fluid in the left inguinal subcutaneous soft tissues, which may represent a postoperative seroma or hematoma. 3. Moderate volume presacral and pelvic free fluid, likely postsurgical. 4. Extensive body wall edema along bilateral flank and anterior lower abdomen extending into the labia and proximal thighs. 5. New left lower lobe consolidation and tree-in-bud nodules, likely aspiration or pneumonia. 6. Trace right pleural effusion. 7. Aortic Atherosclerosis (ICD10-I70.0). Coronary artery calcifications. Assessment for potential risk factor modification, dietary therapy or pharmacologic therapy may be warranted, if clinically indicated. Electronically Signed   By: Limin  Xu M.D.   On: 03/05/2024 14:23   DG Abd Portable 1V Result Date: 03/05/2024 CLINICAL DATA:  Postoperative nausea vomiting. EXAM: PORTABLE ABDOMEN - 1 VIEW COMPARISON:  03/04/2024. FINDINGS: Nonobstructive bowel gas pattern. Mild gaseous distension of small bowel loops in the left abdomen. No evidence of free air. Visualized osseous structures are unchanged. IMPRESSION: Nonobstructive bowel gas pattern. Mild gaseous distension of  small bowel loops  in the left abdomen. Electronically Signed   By: Harrietta Sherry M.D.   On: 03/05/2024 11:24   DG Abd Portable 1V Result Date: 03/04/2024 CLINICAL DATA:  [id number] Vomiting [id number].  Abdomen pain. EXAM: PORTABLE ABDOMEN - 1 VIEW COMPARISON:  None Available. FINDINGS: The bowel gas pattern is non-obstructive.  No abnormal stool burden. No evidence of pneumoperitoneum, within the limitations of a supine film. No acute osseous abnormalities. The soft tissues are within normal limits. Surgical changes, devices, tubes and lines: None. IMPRESSION: Nonobstructive bowel gas pattern. Electronically Signed   By: Ree Molt M.D.   On: 03/04/2024 12:00   CT ABDOMEN PELVIS W CONTRAST Result Date: 03/03/2024 CLINICAL DATA:  Bowel obstruction suspected. Left inguinal hernia. Lower abdominal and pelvic pain. Difficulty urinating. Nausea and vomiting. EXAM: CT ABDOMEN AND PELVIS WITH CONTRAST TECHNIQUE: Multidetector CT imaging of the abdomen and pelvis was performed using the standard protocol following bolus administration of intravenous contrast. RADIATION DOSE REDUCTION: This exam was performed according to the departmental dose-optimization program which includes automated exposure control, adjustment of the mA and/or kV according to patient size and/or use of iterative reconstruction technique. CONTRAST:  85mL OMNIPAQUE  IOHEXOL  300 MG/ML  SOLN COMPARISON:  Ultrasound abdomen 08/09/2022 FINDINGS: Lower chest: Lung bases are clear. Hepatobiliary: No focal liver abnormality is seen. No gallstones, gallbladder wall thickening, or biliary dilatation. Pancreas: Unremarkable. No pancreatic ductal dilatation or surrounding inflammatory changes. Spleen: Normal in size without focal abnormality. Adrenals/Urinary Tract: Adrenal glands are unremarkable. Kidneys are normal, without renal calculi, focal lesion, or hydronephrosis. Bladder is unremarkable. Stomach/Bowel: Small left inguinal hernia containing a loop of small bowel with  proximal dilated bowel loops with air-fluid levels consistent with proximal obstruction. Remainder of the small bowel, stomach, and colon are decompressed. No bowel wall thickening, pneumatosis, or portal venous gas. Appendix is normal. Vascular/Lymphatic: Aortic atherosclerosis. No enlarged abdominal or pelvic lymph nodes. Reproductive: Status post hysterectomy. No adnexal masses. Other: No free air or free fluid in the abdomen. Musculoskeletal: Degenerative changes in the spine. No acute bony abnormalities. IMPRESSION: 1. Left inguinal hernia containing small bowel with left lower quadrant anterior dilated small bowel with gas and fluid levels consistent with small-bowel obstruction. No ischemic changes are identified. 2. Aortic atherosclerosis. Electronically Signed   By: Elsie Gravely M.D.   On: 03/03/2024 02:50    Assessment/Plan Abdominal fluid collection with sinus tract to bladder Persistent fluid collection with sinus tract to bladder - Schedule drain for diagnostic and therapeutic purposes per surgery recommendation  - Monitor for infection signs: fever, increased pain, blood pressure changes. - Complete Augmentin  on 03/31/24  Wound with delayed healing and mild drainage Delayed wound healing with mild drainage - Monitor for infection or worsening. - Reinforce dressing as needed. -surgical f/u 8/19 -check labs.   Mild Hypotension Blood pressure at 93/53 - Encourage increased fluid intake. - Monitor blood pressure closely. - Educated on sepsis signs and emergency care. -check labs  Labs/tests ordered:  CBC CMP

## 2024-03-30 NOTE — Telephone Encounter (Signed)
-----   Message from Dayne Daniel Hassell sent at 03/30/2024  4:09 PM EDT ----- Regarding: RE: IR guided aspiration Ok US  aspiration/drain placement L suprapubic SQ collection Local only ok  [facility] ----- Message ----- From: Carolee Rosina BIRCH Sent: 03/30/2024   2:14 PM EDT To: Ir Procedure Requests Subject: IR guided aspiration                           Procedure: IR guided aspiration  Dx: lower abdominal pain, hx of femoral hernia repair  Ordering: Tonja Aleck Shaper, PA [facility] [phone]  Imaging: CT abd/pelvis in epic  Please review.   Thanks,  Erwin

## 2024-03-31 ENCOUNTER — Encounter: Payer: Self-pay | Admitting: Adult Health

## 2024-03-31 ENCOUNTER — Encounter (HOSPITAL_COMMUNITY): Payer: Self-pay

## 2024-03-31 ENCOUNTER — Telehealth: Payer: Self-pay | Admitting: Orthopedic Surgery

## 2024-03-31 ENCOUNTER — Encounter: Payer: Self-pay | Admitting: Orthopedic Surgery

## 2024-03-31 ENCOUNTER — Other Ambulatory Visit (HOSPITAL_COMMUNITY): Payer: Self-pay | Admitting: Student

## 2024-03-31 ENCOUNTER — Non-Acute Institutional Stay (SKILLED_NURSING_FACILITY): Payer: Self-pay | Admitting: Orthopedic Surgery

## 2024-03-31 DIAGNOSIS — N3289 Other specified disorders of bladder: Secondary | ICD-10-CM | POA: Diagnosis not present

## 2024-03-31 DIAGNOSIS — Z8719 Personal history of other diseases of the digestive system: Secondary | ICD-10-CM

## 2024-03-31 DIAGNOSIS — Z9889 Other specified postprocedural states: Secondary | ICD-10-CM

## 2024-03-31 DIAGNOSIS — I959 Hypotension, unspecified: Secondary | ICD-10-CM

## 2024-03-31 DIAGNOSIS — K219 Gastro-esophageal reflux disease without esophagitis: Secondary | ICD-10-CM | POA: Diagnosis not present

## 2024-03-31 DIAGNOSIS — R188 Other ascites: Secondary | ICD-10-CM

## 2024-03-31 DIAGNOSIS — R1030 Lower abdominal pain, unspecified: Secondary | ICD-10-CM

## 2024-03-31 DIAGNOSIS — R2689 Other abnormalities of gait and mobility: Secondary | ICD-10-CM | POA: Diagnosis not present

## 2024-03-31 DIAGNOSIS — F5101 Primary insomnia: Secondary | ICD-10-CM | POA: Diagnosis not present

## 2024-03-31 DIAGNOSIS — K4190 Unilateral femoral hernia, without obstruction or gangrene, not specified as recurrent: Secondary | ICD-10-CM | POA: Diagnosis not present

## 2024-03-31 DIAGNOSIS — F32A Depression, unspecified: Secondary | ICD-10-CM | POA: Diagnosis not present

## 2024-03-31 DIAGNOSIS — R2681 Unsteadiness on feet: Secondary | ICD-10-CM | POA: Diagnosis not present

## 2024-03-31 MED ORDER — VALACYCLOVIR HCL 1 G PO TABS: 1000.0000 mg | ORAL_TABLET | Freq: Two times a day (BID) | ORAL | Status: AC | PRN

## 2024-03-31 NOTE — Progress Notes (Signed)
 [facility] Room Number: 151-P Place of Service:  SNF [phone])  Provider: Greig Cluster, NP  PCP: [name], MD Patient Care Team: [name], MD as PCP - General (Internal Medicine) [name], DO as Consulting Physician (Geriatric Medicine) [name], MD as Consulting Physician (Orthopedic Surgery)  Extended Emergency Contact Information Primary Emergency Contact: Littlejohn,Sara  [place] Home Phone: [phone] Relation: Daughter Secondary Emergency Contact: Murphy,Jim Mobile Phone: [phone] Relation: Significant other  Code Status: DNR Goals of care:  Advanced Directive information    03/31/2024   12:54 PM  Advanced Directives  Does Patient Have a Medical Advance Directive? Yes  Type of Estate agent of [place];Living will;Out of facility DNR (pink MOST or yellow form)  Does patient want to make changes to medical advance directive? No - Patient declined  Copy of Healthcare Power of Attorney in Chart? Yes - validated most recent copy scanned in chart (See row information)     Allergies  Allergen Reactions   Bee Venom Anaphylaxis   Sporanos [Itraconazole] Anaphylaxis   Tamoxifen Anaphylaxis    Gave her a blood clot   Anastrozole  Other (See Comments)    Unknown, pt was unaware of this allergy    Sulfa Antibiotics Nausea And Vomiting    Chief Complaint  Patient presents with   Discharge Note    Discharge from SNF    HPI:  83 y.o. female seen today for discharge evaluation.   She currently resides on the rehab unit at [facility]. PMH: GERD, arthritis, right breast cancer, h/o DVT, incontinence and insomnia.   07/08 she underwent left femoral hernia repair due to SBO. She continued to have lower abdominal pain and urinary retention after procedure. 07/18 she had general surgery f/u and was found to have left groin swelling. 07/23 she presented to the ED due  to worsening left groin pain and swelling. CT abdomen noted anterior bladder perforation and 2.3 x 7.0 walled off fluids collection in extraperitoneal space. CT cystogram confirmed bladder rupture. General surgery and urology were consulted. 07/24 she underwent exploratory laparotomy and bladder repair which involved placing JP drain and foley catheter. WBC stayed normal during hospitalization. Hgb was noted to be 6.8. She was given 1 unit of PRBC. Hgb improved > 10. Diet was slowly advanced and tolerated. JP drain was removed prior to discharge. She was prescribed Augmentin  x 7 days. Gabapentin  was prescribed for pain. Advised to f/u with general surgery and urology outpatient.   She has done well since rehab readmit. 08/01 CT abdomen noted fluid collection from anterior bladder to anterior peritoneal wall and subcutaneous tissues. General surgery recommended scheduling drain with I/R> scheduled 08/22. Next general surgery follow up 08/09. She denies LLQ pain today, but did have a few episodes yesterday. Appetite fair. Ambulating on own without difficulty. LBM this morning. Foley remains in place due. UOP adequate. Urology follow up 08/11. Augmentin  completed today. Concerns for low blood pressures yesterday. She reports always having low SBP. Denies dizziness or lightheadedness. Daughter to help once she returns home. Afebrile. Vitals stable.     Past Medical History:  Diagnosis Date   Anxiety    Breast cancer (HCC) 06/27/2012   Right   Cataract    DVT (deep venous thrombosis) (HCC) 06/28/2015   Dyslipidemia    GERD (gastroesophageal reflux disease)    Insomnia    Osteopenia    Overactive bladder    Personal history of radiation  therapy    Shingles 06/2016   Thyroid  disease     Past Surgical History:  Procedure Laterality Date   BLADDER REPAIR  03/19/2024   Procedure: REPAIR, BLADDER;  Surgeon: Kinsinger, Herlene Righter, MD;  Location: [facility] ORS;  Service: General;;  Cystorrhaphy with Dr Selma and  Dr Elisabeth   BOWEL RESECTION N/A 03/03/2024   Procedure: EXCISION, SMALL INTESTINE;  Surgeon: Ann Fine, MD;  Location: [facility] OR;  Service: General;  Laterality: N/A;   BREAST LUMPECTOMY Right 2014   BREAST LUMPECTOMY  2013   right   CATARACT EXTRACTION W/ INTRAOCULAR LENS IMPLANT Bilateral    COLONOSCOPY  08/27/2012   [place]    INGUINAL HERNIA REPAIR N/A 03/03/2024   Procedure: REPAIR, HERNIA, INGUINAL/FEMORAL, ADULT;  Surgeon: Ann Fine, MD;  Location: [facility] OR;  Service: General;  Laterality: N/A;  LEFT INGUINAL HERNIA  REPAIR POSSIBLE LAPAROTOMY WITH SMALL BOWEL RESECTION   INSERTION OF MESH Left 03/03/2024   Procedure: INSERTION OF VICRYL MESH, LEFT GROIN;  Surgeon: Ann Fine, MD;  Location: [facility] OR;  Service: General;  Laterality: Left;   KNEE ARTHROSCOPY     left   LAPAROSCOPIC ABDOMINAL EXPLORATION N/A 03/03/2024   Procedure: EXPLORATION, ABDOMEN, LAPAROSCOPIC;  Surgeon: Ann Fine, MD;  Location: [facility] OR;  Service: General;  Laterality: N/A;   LAPAROTOMY N/A 03/19/2024   Procedure: LAPAROTOMY, EXPLORATORY;  Surgeon: Stevie Herlene Righter, MD;  Location: [facility] OR[facility];  Service: General;  Laterality: N/A;  Dr. Selma or Dr. Elisabeth will assist   LASIK Bilateral    REDUCTION MAMMAPLASTY Bilateral 1985   anchor scar    VAGINAL HYSTERECTOMY  2002      reports that she has quit smoking. Her smoking use included cigarettes. She has a 10 pack-year smoking history. She has never used smokeless tobacco. She reports current alcohol  use. She reports that she does not use drugs. Social History   Socioeconomic History   Marital status: Significant Other    Spouse name: Not on file   Number of children: Not on file   Years of education: Not on file   Highest education level: Not on file  Occupational History   Not on file  Tobacco Use   Smoking status: Former    Current packs/day: 1.00    Average packs/day: 1 pack/day for 10.0 years (10.0 ttl pk-yrs)    Types: Cigarettes   Smokeless  tobacco: Never  Vaping Use   Vaping status: Never Used  Substance and Sexual Activity   Alcohol  use: Yes    Comment: 1-2 drinks per day   Drug use: No   Sexual activity: Not Currently  Other Topics Concern   Not on file  Social History Narrative   Diet: General       Caffeine: Yes      Married, if yes what year: Widow, 1961/1988      Do you live in a house, apartment, assisted living, condo, trailer, etc: 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 n   Do you have difficulty preparing food or eating?n   Do you have difficulty managing your medications?n   Do you have difficulty managing your finances?n  Do you have difficulty affording your medications?n   Social Drivers of Health   Financial Resource Strain: Not on file  Food Insecurity: No Food Insecurity (03/19/2024)   Hunger Vital Sign    Worried About Running Out of Food in the Last Year: Never true    Ran Out of Food in the Last Year: Never true  Transportation Needs: No Transportation Needs (03/19/2024)   PRAPARE - Administrator, Civil Service (Medical): No    Lack of Transportation (Non-Medical): No  Physical Activity: Not on file  Stress: Not on file  Social Connections: Moderately Isolated (03/19/2024)   Social Connection and Isolation Panel    Frequency of Communication with Friends and Family: More than three times a week    Frequency of Social Gatherings with Friends and Family: More than three times a week    Attends Religious Services: Never    Database administrator or Organizations: No    Attends Banker Meetings: 1 to 4 times per year    Marital Status: Widowed  Intimate Partner Violence: Not At Risk (03/19/2024)   Humiliation, Afraid, Rape, and Kick  questionnaire    Fear of Current or Ex-Partner: No    Emotionally Abused: No    Physically Abused: No    Sexually Abused: No   Functional Status Survey:    Allergies  Allergen Reactions   Bee Venom Anaphylaxis   Sporanos [Itraconazole] Anaphylaxis   Tamoxifen Anaphylaxis    Gave her a blood clot   Anastrozole  Other (See Comments)    Unknown, pt was unaware of this allergy    Sulfa Antibiotics Nausea And Vomiting    Pertinent  Health Maintenance Due  Topic Date Due   INFLUENZA VACCINE  03/27/2024   DEXA SCAN  Completed    Medications: Allergies as of 03/31/2024       Reactions   Bee Venom Anaphylaxis   Sporanos [itraconazole] Anaphylaxis   Tamoxifen Anaphylaxis   Gave her a blood clot   Anastrozole  Other (See Comments)   Unknown, pt was unaware of this allergy    Sulfa Antibiotics Nausea And Vomiting        Medication List        Accurate as of March 31, 2024 12:58 PM. If you have any questions, ask your nurse or doctor.          acetaminophen  500 MG tablet Commonly known as: TYLENOL  Take 2 tablets (1,000 mg total) by mouth every 6 (six) hours as needed.   albuterol  108 (90 Base) MCG/ACT inhaler Commonly known as: VENTOLIN  HFA Inhale 2 puffs into the lungs twice daily as needed (Use 2 puffs 2x a day prn Inhalation as directed 30 days)   amoxicillin -clavulanate 875-125 MG tablet Commonly known as: AUGMENTIN  Take 1 tablet by mouth 2 (two) times daily for 7 days.   buPROPion  300 MG 24 hr tablet Commonly known as: WELLBUTRIN  XL Take 1 tablet (300 mg total) by mouth in the morning.   busPIRone  5 MG tablet Commonly known as: BUSPAR  Take 1 tablet (5 mg total) by mouth daily.   EPINEPHrine  0.3 mg/0.3 mL Soaj injection Commonly known as: EPI-PEN Inject 0.3 mg into the muscle as needed for anaphylaxis.   gabapentin  100 MG capsule Commonly known as: NEURONTIN  Take 2 capsules (200 mg total) by mouth 3 (three) times daily for 7 days.   ibuprofen  200 MG  tablet Commonly known as: ADVIL  Take 3 tablets (600 mg total) by mouth every  8 (eight) hours as needed for moderate pain (pain score 4-6).   methocarbamol  500 MG tablet Commonly known as: ROBAXIN  Take 1 tablet (500 mg total) by mouth every 6 (six) hours as needed for muscle spasms.   omeprazole  40 MG capsule Commonly known as: PRILOSEC Take 1 capsule (40 mg total) by mouth daily.   oxyCODONE  5 MG immediate release tablet Commonly known as: Oxy IR/ROXICODONE  Take 1 tablet (5 mg total) by mouth every 6 (six) hours as needed for severe pain (pain score 7-10) (not relieved by tylenol  or robaxin ).   PEG 3350  17 g Pack Take 17 g (one packet dissolved in liquid) by mouth daily.   Stool Softener/Laxative 50-8.6 MG tablet Generic drug: senna-docusate Take 1 tablet by mouth at bedtime.   triamcinolone  cream 0.1 % Commonly known as: KENALOG  Apply 1 application on the skin twice a day Apply thin layer to affected areas twice a day x 1-3 weeks as needed.   valACYclovir  1000 MG tablet Commonly known as: Valtrex  Take 1 tablet (1,000 mg total) by mouth 2 (two) times daily as needed (For cold sores.).   Voltaren 1 % Gel Generic drug: diclofenac Sodium Apply 1 Application topically daily as needed (pain).   zolpidem  5 MG tablet Commonly known as: AMBIEN  Take 10 mg by mouth at bedtime as needed for sleep.        Review of Systems  Constitutional: Negative.   HENT: Negative.    Eyes: Negative.   Respiratory: Negative.    Cardiovascular: Negative.   Gastrointestinal:  Positive for abdominal pain. Negative for abdominal distention, constipation, diarrhea, nausea and vomiting.  Genitourinary:  Negative for hematuria.  Musculoskeletal:  Positive for gait problem.  Skin:  Negative for wound.  Neurological:  Negative for dizziness and light-headedness.  Psychiatric/Behavioral:  Positive for sleep disturbance. Negative for dysphoric mood. The patient is not nervous/anxious.     Vitals:    03/31/24 1246  BP: 94/63  Pulse: 73  Resp: 16  Temp: 97.6 F (36.4 C)  SpO2: 98%  Weight: 139 lb 12.8 oz (63.4 kg)  Height: 5' 4 (1.626 m)   Body mass index is 24 kg/m. Physical Exam Vitals reviewed.  Constitutional:      General: She is not in acute distress. HENT:     Head: Normocephalic.  Eyes:     General:        Right eye: No discharge.        Left eye: No discharge.  Cardiovascular:     Rate and Rhythm: Normal rate and regular rhythm.     Pulses: Normal pulses.     Heart sounds: Normal heart sounds.  Pulmonary:     Effort: Pulmonary effort is normal. No respiratory distress.     Breath sounds: Normal breath sounds. No wheezing or rales.  Abdominal:     General: Bowel sounds are normal. There is no distension.     Palpations: Abdomen is soft. There is no mass.     Tenderness: There is abdominal tenderness. There is no guarding or rebound.     Hernia: No hernia is present.     Comments: LLQ tenderness  Musculoskeletal:        General: Normal range of motion.     Cervical back: Neck supple.  Skin:    General: Skin is warm.     Capillary Refill: Capillary refill takes less than 2 seconds.     Comments: LLQ surgical incision with approximated edges, scab intact, mild serous drainage,  no erythema  Neurological:     General: No focal deficit present.     Mental Status: She is alert and oriented to person, place, and time.  Psychiatric:        Mood and Affect: Mood normal.     Labs reviewed: Basic Metabolic Panel: Recent Labs    03/09/24 0714 03/10/24 0637 03/18/24 2210 03/22/24 0536 03/23/24 0510 03/24/24 0517 03/30/24 0000  NA  --   --    < > 137 139 138 137  K  --   --    < > 4.5 4.5 4.3 4.5  CL  --   --    < > 103 105 107 103  CO2  --   --    < > 28 25 24  28*  GLUCOSE  --   --    < > 89 98 87  --   BUN  --   --    < > 16 17 15 11   CREATININE  --   --    < > 0.75 0.63 0.78 0.8  CALCIUM  --   --    < > 9.1 9.1 8.8* 9.6  MG 2.1 2.0  --  2.3  --    --   --   PHOS 3.7 3.0  --  3.8  --   --   --    < > = values in this interval not displayed.   Liver Function Tests: Recent Labs    03/02/24 2228 03/05/24 1511 03/18/24 2210 03/30/24 0000  AST 14*  --  12* 11*  ALT 12  --  17 15  ALKPHOS 59  --  65 60  BILITOT 0.4  --  0.7  --   PROT 7.4  --  6.8  --   ALBUMIN 4.4 3.0* 3.0* 3.9   Recent Labs    03/02/24 2228 03/18/24 2210  LIPASE 21 31   No results for input(s): AMMONIA in the last 8760 hours. CBC: Recent Labs    03/18/24 2210 03/19/24 0130 03/19/24 0531 03/22/24 0536 03/23/24 0510 03/24/24 0517 03/30/24 0000  WBC 7.7 6.0   < > 6.1 6.1 7.0 6.8  NEUTROABS 4.7 3.9  --   --   --   --   --   HGB 6.8* 10.2*   < > 10.5* 11.1* 11.1* 11.4*  HCT 20.9* 32.2*   < > 32.9* 34.3* 35.3* 35*  MCV 92.5 92.3   < > 93.5 93.7 95.1  --   PLT 653* 474*   < > 458* 456* 476* 464*   < > = values in this interval not displayed.   Cardiac Enzymes: No results for input(s): CKTOTAL, CKMB, CKMBINDEX, TROPONINI in the last 8760 hours. BNP: Invalid input(s): POCBNP CBG: Recent Labs    03/07/24 1246  GLUCAP 145*    Procedures and Imaging Studies During Stay: CT PELVIS WO CONTRAST Result Date: 03/27/2024 CLINICAL DATA:  Possible fluid collection. EXAM: CT PELVIS WITHOUT CONTRAST TECHNIQUE: Multidetector CT imaging of the pelvis was performed following the standard protocol without intravenous contrast. RADIATION DOSE REDUCTION: This exam was performed according to the departmental dose-optimization program which includes automated exposure control, adjustment of the mA and/or kV according to patient size and/or use of iterative reconstruction technique. COMPARISON:  March 24, 2024. FINDINGS: Urinary Tract: Urinary bladder is decompressed secondary to Foley catheter. There remains complex fluid collection extending from the anterior portion of urinary bladder into anterior pelvis, eventually crossing anterior peritoneal wall  into  fluid collection measuring 4.4 x 1.9 cm in subcutaneous tissue which extends to cutaneous surface. This may be slightly smaller compared to prior exam, but evaluation is limited due to the lack of intravenous contrast. Bowel:  Unremarkable visualized pelvic bowel loops. Vascular/Lymphatic: Aortic atherosclerosis. No definite adenopathy is noted. Reproductive:  Status post hysterectomy. Other:  No definite hernia is noted. Musculoskeletal: No suspicious bone lesions identified. IMPRESSION: Continued presence of complex fluid collection extending from anterior portion of decompressed bladder and through anterior peritoneal wall into subcutaneous tissues and extending to cutaneous surface. This appears to be slightly decreased compared to prior exam, although evaluation is limited due to the lack of intravenous contrast. Aortic Atherosclerosis (ICD10-I70.0). Electronically Signed   By: Lynwood Landy Raddle M.D.   On: 03/27/2024 16:03   CT ABDOMEN PELVIS W CONTRAST Result Date: 03/24/2024 CLINICAL DATA:  post op pain.  Abdominal pain. EXAM: CT ABDOMEN AND PELVIS WITH CONTRAST TECHNIQUE: Multidetector CT imaging of the abdomen and pelvis was performed using the standard protocol following bolus administration of intravenous contrast. RADIATION DOSE REDUCTION: This exam was performed according to the departmental dose-optimization program which includes automated exposure control, adjustment of the mA and/or kV according to patient size and/or use of iterative reconstruction technique. CONTRAST:  OMNIPAQUE  IOHEXOL  300 MG/ML  SOLN COMPARISON:  CT scan abdomen and pelvis from 03/19/2024. FINDINGS: Lower chest: There are patchy atelectatic changes in the visualized lung bases. No overt consolidation. No pleural effusion. The heart is normal in size. No pericardial effusion. Hepatobiliary: The liver is normal in size. Non-cirrhotic configuration. No suspicious mass. There is an ill-defined sub 5 mm hypoattenuating focus  in the right hepatic lobe, which is too small to adequately characterize. No intrahepatic or extrahepatic bile duct dilation. No calcified gallstones. Normal gallbladder wall thickness. No pericholecystic inflammatory changes. Pancreas: Unremarkable. No pancreatic ductal dilatation or surrounding inflammatory changes. Spleen: Within normal limits. No focal lesion. Adrenals/Urinary Tract: Adrenal glands are unremarkable. No suspicious renal mass. No hydronephrosis. No renal or ureteric calculi. Urinary bladder is decompressed secondary to Foley catheter. Redemonstration of sinus tract extending from the anterior wall of the urinary bladder with a smaller (approximately 2.7 x 4.8 cm), incomplete walled-off collection in the space of Retzius/extra peritoneum as well as sinus tract extending through the left lower quadrant anterior abdominal wall (series 2, image 62), with resultant 3.5 x 10.9 cm walled-off collection in the subcutaneous tissue. Stomach/Bowel: Enteroenteric anastomosis noted in the midline anterior portion of lower abdomen. No disproportionate dilation of the small or large bowel loops. No evidence of abnormal bowel wall thickening or inflammatory changes. The appendix is unremarkable. Vascular/Lymphatic: No ascites or pneumoperitoneum. No abdominal or pelvic lymphadenopathy, by size criteria. No aneurysmal dilation of the major abdominal arteries. There are mild peripheral atherosclerotic vascular calcifications of the aorta and its major branches. Reproductive: The uterus is surgically absent. No large adnexal mass. Other: There is mild anasarca. Musculoskeletal: No suspicious osseous lesions. There are mild - moderate multilevel degenerative changes in the visualized spine. IMPRESSION: 1. Redemonstration of sinus tract extending from the anterior wall of the urinary bladder with a smaller (approximately 2.7 x 4.8 cm), incomplete walled-off collection in the Space of Retzius/extra peritoneum. The  sinus tract extends through the left lower quadrant anterior abdominal wall, with associated 3.5 x 10.9 cm walled-off collection in the anterior abdominal wall subcutaneous tissue. 2. Multiple other nonacute observations, as described above. Aortic Atherosclerosis (ICD10-I70.0). Electronically Signed   By: Ree Kimberlee HERO.D.  On: 03/24/2024 15:23   DG Abd Portable 1V Result Date: 03/21/2024 CLINICAL DATA:  Two days postop abdominal surgery with abdominal pain, initial encounter EXAM: PORTABLE ABDOMEN - 1 VIEW COMPARISON:  03/05/2024 FINDINGS: Scattered large and small bowel gas is noted. Scattered fecal material is noted. No constipation or obstructive changes are seen. No free air is noted. Degenerative changes of lumbar spine are seen. IMPRESSION: No obstructive changes are noted.  No free air is seen. Electronically Signed   By: Oneil Devonshire M.D.   On: 03/21/2024 22:43   CT CYSTOGRAM PELVIS Result Date: 03/19/2024 EXAM: CT UROGRAM 03/19/2024 02:26:42 AM TECHNIQUE: CT of the abdomen and pelvis was performed before and after the administration of intravenous contrast as per CT urogram protocol. Multiplanar reformatted images as well as MIP urogram images are provided for review. Automated exposure control, iterative reconstruction, and/or weight based adjustment of the mA/kV was utilized to reduce the radiation dose to as low as reasonably achievable. COMPARISON: CT abdomen/pelvis earlier today. CLINICAL HISTORY: Bladder diverticulum. S/p left inguinal hernia surgery, abnormality of the bladder seen on an earlier CT; 100 mL Omni via urinary catheter. FINDINGS: LOWER CHEST: No acute abnormality. LIVER: The liver is unremarkable. GALLBLADDER AND BILE DUCTS: Gallbladder is unremarkable. No biliary ductal dilatation. SPLEEN: No acute abnormality. PANCREAS: No acute abnormality. ADRENAL GLANDS: No acute abnormality. KIDNEYS, URETERS AND BLADDER: No stones in the kidneys or ureters. No hydronephrosis. No  perinephric or periureteral stranding. High density contrast administered via indwelling Foley catheter extravasates into the extraperitoneal space anterior to the bladder (image 6). Associated frank perforation of the anterior bladder (image 41). Additional contrast within the 3.5 x 8.1 cm fluid collection in the left lower anterior pelvic wall (image 7), previously demonstrated to communicate with the extraperitoneal space. GI AND BOWEL: Stomach demonstrates no acute abnormality. There is no bowel obstruction. PERITONEUM AND RETROPERITONEUM: No ascites. No free air. VASCULATURE: Aorta is normal in caliber. LYMPH NODES: No lymphadenopathy. REPRODUCTIVE ORGANS: No acute abnormality. BONES AND SOFT TISSUES: No acute osseous abnormality. No focal soft tissue abnormality. IMPRESSION: 1. Frank perforation of the anterior bladder with associated extraperitoneal contrast extravasation. 2. Contrast within a 3.5 x 8.1 cm fluid collection in the left lower anterior pelvic wall, previously demonstrated to communicate with the extraperitoneal space. Electronically signed by: Pinkie Pebbles MD 03/19/2024 02:31 AM EDT RP Workstation: [id number]   CT ABDOMEN PELVIS W CONTRAST Result Date: 03/19/2024 EXAM: CT ABDOMEN AND PELVIS WITH CONTRAST 03/19/2024 12:16:32 AM TECHNIQUE: CT of the abdomen and pelvis was performed with the administration of intravenous contrast. Multiplanar reformatted images are provided for review. Automated exposure control, iterative reconstruction, and/or weight based adjustment of the mA/kV was utilized to reduce the radiation dose to as low as reasonably achievable. COMPARISON: 03/05/2024 CLINICAL HISTORY: LLQ abdominal pain. left lower quadrant abdominal pain for about 1 day. She had abdominal hernia surgery 12 days ago. EMS report the site is swollen, but the incision looks normal, hx of breast ca, GFR>60. FINDINGS: LOWER CHEST: No acute abnormality. LIVER: The liver is unremarkable. GALLBLADDER  AND BILE DUCTS: Gallbladder is unremarkable. No biliary ductal dilatation. SPLEEN: No acute abnormality. PANCREAS: No acute abnormality. ADRENAL GLANDS: No acute abnormality. KIDNEYS, URETERS AND BLADDER: No stones in the kidneys or ureters. No hydronephrosis. No perinephric or periureteral stranding. Frank anterior bladder perforation (image 59) with associated 2.3 x 7.0 cm walled off fluid collection anteriorly in the extraperitoneal space (image 57) directly communicating with an additional 3.6 x 8.8 cm fluid collection  in the left lower anterior abdominal wall (image 60). GI AND BOWEL: Small bowel resection with suture line in the left mid/lower abdomen. Normal appendix (image 45). No bowel obstruction. No bowel wall thickening. PERITONEUM AND RETROPERITONEUM: No free air. VASCULATURE: Atherosclerotic calcifications of the abdominal aorta and branch vessels, although patent. LYMPH NODES: No lymphadenopathy. REPRODUCTIVE ORGANS: Status post hysterectomy. BONES AND SOFT TISSUES: Mild degenerative changes of the lumbar spine. No acute osseous abnormality. No focal soft tissue abnormality. IMPRESSION: 1. Frank anterior bladder perforation. 2. Associated 2.3 x 7.0 cm walled off fluid collection in the extraperitoneal space. 3. Direct communication with additional 3.6 x 8.8 cm fluid collection in the left lower anterior abdominal wall. 4. Surgical consultation is advised. Electronically signed by: Pinkie Pebbles MD 03/19/2024 12:25 AM EDT RP Workstation: [id number]   CT ABDOMEN PELVIS WO CONTRAST Result Date: 03/05/2024 CLINICAL DATA:  Postop day 2 status post hernia repair and bowel resection with abdominal pain, urinary retention, and abdominal and labial swelling EXAM: CT ABDOMEN AND PELVIS WITHOUT CONTRAST TECHNIQUE: Multidetector CT imaging of the abdomen and pelvis was performed following the standard protocol without IV contrast. RADIATION DOSE REDUCTION: This exam was performed according to the  departmental dose-optimization program which includes automated exposure control, adjustment of the mA and/or kV according to patient size and/or use of iterative reconstruction technique. COMPARISON:  CT abdomen and pelvis dated 03/03/2024 FINDINGS: Lower chest: New left lower lobe consolidation and tree-in-bud nodules. Trace right pleural effusion. Partially imaged heart size is normal. Coronary artery calcifications. Hepatobiliary: No focal hepatic lesions. No intra or extrahepatic biliary ductal dilation. Vicariously excreted contrast material within the gallbladder. Pancreas: No focal lesions or main ductal dilation. Spleen: Normal in size without focal abnormality. Adrenals/Urinary Tract: No adrenal nodules. No suspicious renal mass on this noncontrast enhanced examination or hydronephrosis. Punctate nonobstructing left lower pole stone. Decompressed urinary bladder with catheter in-situ. Stomach/Bowel: Normal appearance of the stomach. Midline small bowel anastomosis deep to the level of the umbilicus. No evidence of bowel wall thickening, distention, or inflammatory changes. Normal appendix. Vascular/Lymphatic: Aortic atherosclerosis. No enlarged abdominal or pelvic lymph nodes. Reproductive: No adnexal masses. 2.1 x 1.8 cm hyperattenuating focus within the left perineal region (3:83), likely subtly present on prior examination and may represent a Bartholin cyst. Other: Small volume intraperitoneal free air. Scattered foci of subcutaneous emphysema in the left hemiabdomen, most notable in the left inguinal region, where there is small loculated fluid measuring 4.0 x 1.8 cm (3:70). Moderate volume presacral and pelvic free fluid. Musculoskeletal: No acute or abnormal lytic or blastic osseous lesions. Multilevel degenerative changes of the partially imaged thoracic and lumbar spine. Extensive body wall edema along bilateral flank and anterior lower abdomen extending into the labia and proximal thighs.  IMPRESSION: 1. Small volume intraperitoneal free air, likely postsurgical. 2. Scattered foci of subcutaneous emphysema in the left hemiabdomen, most notable in the left inguinal region, likely postsurgical. Small loculated fluid in the left inguinal subcutaneous soft tissues, which may represent a postoperative seroma or hematoma. 3. Moderate volume presacral and pelvic free fluid, likely postsurgical. 4. Extensive body wall edema along bilateral flank and anterior lower abdomen extending into the labia and proximal thighs. 5. New left lower lobe consolidation and tree-in-bud nodules, likely aspiration or pneumonia. 6. Trace right pleural effusion. 7. Aortic Atherosclerosis (ICD10-I70.0). Coronary artery calcifications. Assessment for potential risk factor modification, dietary therapy or pharmacologic therapy may be warranted, if clinically indicated. Electronically Signed   By: Limin  Xu M.D.  On: 03/05/2024 14:23   DG Abd Portable 1V Result Date: 03/05/2024 CLINICAL DATA:  Postoperative nausea vomiting. EXAM: PORTABLE ABDOMEN - 1 VIEW COMPARISON:  03/04/2024. FINDINGS: Nonobstructive bowel gas pattern. Mild gaseous distension of small bowel loops in the left abdomen. No evidence of free air. Visualized osseous structures are unchanged. IMPRESSION: Nonobstructive bowel gas pattern. Mild gaseous distension of small bowel loops in the left abdomen. Electronically Signed   By: Harrietta Sherry M.D.   On: 03/05/2024 11:24   DG Abd Portable 1V Result Date: 03/04/2024 CLINICAL DATA:  [id number] Vomiting [id number].  Abdomen pain. EXAM: PORTABLE ABDOMEN - 1 VIEW COMPARISON:  None Available. FINDINGS: The bowel gas pattern is non-obstructive.  No abnormal stool burden. No evidence of pneumoperitoneum, within the limitations of a supine film. No acute osseous abnormalities. The soft tissues are within normal limits. Surgical changes, devices, tubes and lines: None. IMPRESSION: Nonobstructive bowel gas pattern. Electronically  Signed   By: Ree Molt M.D.   On: 03/04/2024 12:00   CT ABDOMEN PELVIS W CONTRAST Result Date: 03/03/2024 CLINICAL DATA:  Bowel obstruction suspected. Left inguinal hernia. Lower abdominal and pelvic pain. Difficulty urinating. Nausea and vomiting. EXAM: CT ABDOMEN AND PELVIS WITH CONTRAST TECHNIQUE: Multidetector CT imaging of the abdomen and pelvis was performed using the standard protocol following bolus administration of intravenous contrast. RADIATION DOSE REDUCTION: This exam was performed according to the departmental dose-optimization program which includes automated exposure control, adjustment of the mA and/or kV according to patient size and/or use of iterative reconstruction technique. CONTRAST:  85mL OMNIPAQUE  IOHEXOL  300 MG/ML  SOLN COMPARISON:  Ultrasound abdomen 08/09/2022 FINDINGS: Lower chest: Lung bases are clear. Hepatobiliary: No focal liver abnormality is seen. No gallstones, gallbladder wall thickening, or biliary dilatation. Pancreas: Unremarkable. No pancreatic ductal dilatation or surrounding inflammatory changes. Spleen: Normal in size without focal abnormality. Adrenals/Urinary Tract: Adrenal glands are unremarkable. Kidneys are normal, without renal calculi, focal lesion, or hydronephrosis. Bladder is unremarkable. Stomach/Bowel: Small left inguinal hernia containing a loop of small bowel with proximal dilated bowel loops with air-fluid levels consistent with proximal obstruction. Remainder of the small bowel, stomach, and colon are decompressed. No bowel wall thickening, pneumatosis, or portal venous gas. Appendix is normal. Vascular/Lymphatic: Aortic atherosclerosis. No enlarged abdominal or pelvic lymph nodes. Reproductive: Status post hysterectomy. No adnexal masses. Other: No free air or free fluid in the abdomen. Musculoskeletal: Degenerative changes in the spine. No acute bony abnormalities. IMPRESSION: 1. Left inguinal hernia containing small bowel with left lower quadrant  anterior dilated small bowel with gas and fluid levels consistent with small-bowel obstruction. No ischemic changes are identified. 2. Aortic atherosclerosis. Electronically Signed   By: Elsie Gravely M.D.   On: 03/03/2024 02:50    Assessment/Plan:   1. Abdominal fluid collection (Primary) - noted CT abdomen 08/01> fluid between anterior bladder and anterior peritoneal wall - scheduled to see IR 08/22 for drain placement  - 08/04 intermittent pain to LLQ> resolved today - advised to notify general surgery with any concerns   2. S/P hernia repair - 03/03/2024 s/p femoral hernia repair due to SBO  3. Extraperitoneal rupture of bladder -  hospitalized 07/23-07/29  - CT abdomen/ cystogram confirmed bladder rupture - 07/24 underwent bladder repair> foley and JP placed - foley UOP adequate - urology f/u 08/11  4. Gastroesophageal reflux disease without esophagitis - cont omeprazole   5. Depression, unspecified depression type - cont Wellbutrin   6. Primary insomnia - cont Ambien  prn  7. Hypotension, unspecified - asymptomatic -  patient reports SBP averaging 90-100's  - not on antihypertensive - advised to contact PCP if dizziness/lightheadedness occurs - discussed adequate hydration with water   Patient is being discharged with the following home health services:  none  Patient is being discharged with the following durable medical equipment:  none  Patient has been advised to f/u with their PCP in 1-2 weeks to bring them up to date on their rehab stay.  Social services at facility was responsible for arranging this appointment.  Pt was provided with a 30 day supply of prescriptions for medications and refills must be obtained from their PCP.  For controlled substances, a more limited supply may be provided adequate until PCP appointment only.  Future labs/tests needed:  cbc/diff, bmp

## 2024-03-31 NOTE — Progress Notes (Signed)
 Carolee Rosina JONETTA Michaelene, Yerachmiel Spinney      Previous Messages    ----- Message ----- From: Johann Sieving, MD Sent: 03/30/2024   4:09 PM EDT To: Rosina JONETTA Carolee Subject: RE: IR guided aspiration                      Ok US  aspiration/drain placement L suprapubic SQ collection Local only ok  DDH ----- Message ----- From: Carolee Rosina JONETTA Sent: 03/30/2024   2:14 PM EDT To: Ir Procedure Requests Subject: IR guided aspiration                          Procedure: IR guided aspiration  Dx: lower abdominal pain, hx of femoral hernia repair  Ordering: Tonja Aleck Shaper, PA [facility] [phone]  Imaging: CT abd/pelvis in epic  Please review.  Thanks, Erwin

## 2024-03-31 NOTE — Telephone Encounter (Signed)
 03/30/2024 MMSE 27/30, correct sentence and shapes.

## 2024-04-03 DIAGNOSIS — S3723XD Laceration of bladder, subsequent encounter: Secondary | ICD-10-CM | POA: Diagnosis not present

## 2024-04-09 ENCOUNTER — Other Ambulatory Visit (HOSPITAL_BASED_OUTPATIENT_CLINIC_OR_DEPARTMENT_OTHER): Payer: Self-pay

## 2024-04-09 MED ORDER — ZOLPIDEM TARTRATE 5 MG PO TABS
5.0000 mg | ORAL_TABLET | Freq: Every evening | ORAL | 0 refills | Status: DC | PRN
  Filled 2024-04-09: qty 45, 22d supply, fill #0

## 2024-04-10 ENCOUNTER — Other Ambulatory Visit (HOSPITAL_BASED_OUTPATIENT_CLINIC_OR_DEPARTMENT_OTHER): Payer: Self-pay

## 2024-04-13 DIAGNOSIS — R338 Other retention of urine: Secondary | ICD-10-CM | POA: Diagnosis not present

## 2024-04-17 ENCOUNTER — Other Ambulatory Visit (HOSPITAL_COMMUNITY): Payer: Self-pay | Admitting: Student

## 2024-04-17 ENCOUNTER — Ambulatory Visit (HOSPITAL_COMMUNITY)
Admission: RE | Admit: 2024-04-17 | Discharge: 2024-04-17 | Disposition: A | Discharge status: Home / Self Care | Source: Ambulatory Visit | Attending: Student | Admitting: Student

## 2024-04-17 DIAGNOSIS — R1030 Lower abdominal pain, unspecified: Secondary | ICD-10-CM

## 2024-04-17 DIAGNOSIS — T8143XA Infection following a procedure, organ and space surgical site, initial encounter: Secondary | ICD-10-CM | POA: Diagnosis not present

## 2024-04-17 DIAGNOSIS — Z8719 Personal history of other diseases of the digestive system: Secondary | ICD-10-CM | POA: Insufficient documentation

## 2024-04-17 DIAGNOSIS — Z9889 Other specified postprocedural states: Secondary | ICD-10-CM | POA: Insufficient documentation

## 2024-04-17 MED ORDER — LIDOCAINE HCL (PF) 1 % IJ SOLN
10.0000 mL | Freq: Once | INTRAMUSCULAR | Status: AC
  Administered 2024-04-17: 10 mL

## 2024-04-17 NOTE — Procedures (Signed)
  Procedure:  US  aspiration LLQ SQ collection 22ml cloudy serous, for GS & cx Preprocedure diagnosis: Diagnoses of Lower abdominal pain and History of femoral hernia repair were pertinent to this visit. Postprocedure diagnosis: same EBL:    minimal Complications:   none immediate  See full dictation in [employer].  CHARM Toribio Faes MD Main # [phone] Pager  [phone] Mobile [phone]

## 2024-04-20 ENCOUNTER — Other Ambulatory Visit (HOSPITAL_BASED_OUTPATIENT_CLINIC_OR_DEPARTMENT_OTHER): Payer: Self-pay

## 2024-04-20 DIAGNOSIS — Z23 Encounter for immunization: Secondary | ICD-10-CM | POA: Diagnosis not present

## 2024-04-21 ENCOUNTER — Other Ambulatory Visit (HOSPITAL_BASED_OUTPATIENT_CLINIC_OR_DEPARTMENT_OTHER): Payer: Self-pay

## 2024-04-21 MED ORDER — DOXYCYCLINE MONOHYDRATE 100 MG PO CAPS
100.0000 mg | ORAL_CAPSULE | Freq: Two times a day (BID) | ORAL | 0 refills | Status: DC
  Filled 2024-04-21: qty 20, 10d supply, fill #0

## 2024-04-22 DIAGNOSIS — E785 Hyperlipidemia, unspecified: Secondary | ICD-10-CM | POA: Diagnosis not present

## 2024-04-22 DIAGNOSIS — E663 Overweight: Secondary | ICD-10-CM | POA: Diagnosis not present

## 2024-04-22 DIAGNOSIS — K760 Fatty (change of) liver, not elsewhere classified: Secondary | ICD-10-CM | POA: Diagnosis not present

## 2024-04-22 LAB — AEROBIC/ANAEROBIC CULTURE W GRAM STAIN (SURGICAL/DEEP WOUND)
Culture: NO GROWTH
Gram Stain: NONE SEEN

## 2024-04-28 DIAGNOSIS — K6289 Other specified diseases of anus and rectum: Secondary | ICD-10-CM | POA: Diagnosis not present

## 2024-04-28 DIAGNOSIS — M7062 Trochanteric bursitis, left hip: Secondary | ICD-10-CM | POA: Diagnosis not present

## 2024-04-28 DIAGNOSIS — K649 Unspecified hemorrhoids: Secondary | ICD-10-CM | POA: Diagnosis not present

## 2024-04-28 DIAGNOSIS — M7061 Trochanteric bursitis, right hip: Secondary | ICD-10-CM | POA: Diagnosis not present

## 2024-05-05 ENCOUNTER — Other Ambulatory Visit (HOSPITAL_COMMUNITY): Payer: Self-pay

## 2024-05-05 ENCOUNTER — Other Ambulatory Visit (HOSPITAL_BASED_OUTPATIENT_CLINIC_OR_DEPARTMENT_OTHER): Payer: Self-pay

## 2024-05-05 MED ORDER — SEMAGLUTIDE-WEIGHT MANAGEMENT 2.4 MG/0.75ML ~~LOC~~ SOAJ
2.4000 mg | SUBCUTANEOUS | 0 refills | Status: AC
  Filled 2024-05-05: qty 3, 28d supply, fill #0

## 2024-05-05 MED ORDER — PFIZER-BIONT COVID-19 VAC-TRIS 30 MCG/0.3ML IM SUSP: INTRAMUSCULAR | 0 refills | Status: AC

## 2024-05-06 DIAGNOSIS — Z23 Encounter for immunization: Secondary | ICD-10-CM | POA: Diagnosis not present

## 2024-05-07 ENCOUNTER — Other Ambulatory Visit (HOSPITAL_BASED_OUTPATIENT_CLINIC_OR_DEPARTMENT_OTHER): Payer: Self-pay

## 2024-05-08 ENCOUNTER — Other Ambulatory Visit (HOSPITAL_BASED_OUTPATIENT_CLINIC_OR_DEPARTMENT_OTHER): Payer: Self-pay

## 2024-05-08 MED ORDER — BUPROPION HCL ER (XL) 300 MG PO TB24
300.0000 mg | ORAL_TABLET | Freq: Every morning | ORAL | 0 refills | Status: DC
  Filled 2024-05-08: qty 90, 90d supply, fill #0

## 2024-05-21 ENCOUNTER — Other Ambulatory Visit (HOSPITAL_BASED_OUTPATIENT_CLINIC_OR_DEPARTMENT_OTHER): Payer: Self-pay

## 2024-05-21 MED ORDER — DOXYCYCLINE MONOHYDRATE 100 MG PO CAPS
100.0000 mg | ORAL_CAPSULE | Freq: Two times a day (BID) | ORAL | 0 refills | Status: DC
  Filled 2024-05-21: qty 14, 7d supply, fill #0

## 2024-05-27 ENCOUNTER — Other Ambulatory Visit (HOSPITAL_COMMUNITY): Payer: Self-pay

## 2024-05-27 ENCOUNTER — Other Ambulatory Visit (HOSPITAL_BASED_OUTPATIENT_CLINIC_OR_DEPARTMENT_OTHER): Payer: Self-pay

## 2024-05-27 MED ORDER — DOXYCYCLINE MONOHYDRATE 100 MG PO CAPS
100.0000 mg | ORAL_CAPSULE | Freq: Two times a day (BID) | ORAL | 0 refills | Status: AC
  Filled 2024-05-27: qty 14, 7d supply, fill #0

## 2024-05-29 ENCOUNTER — Other Ambulatory Visit (HOSPITAL_BASED_OUTPATIENT_CLINIC_OR_DEPARTMENT_OTHER): Payer: Self-pay

## 2024-05-29 ENCOUNTER — Other Ambulatory Visit (HOSPITAL_COMMUNITY): Payer: Self-pay | Admitting: General Surgery

## 2024-05-29 DIAGNOSIS — Z8719 Personal history of other diseases of the digestive system: Secondary | ICD-10-CM

## 2024-05-29 DIAGNOSIS — R1030 Lower abdominal pain, unspecified: Secondary | ICD-10-CM

## 2024-06-01 ENCOUNTER — Other Ambulatory Visit (HOSPITAL_BASED_OUTPATIENT_CLINIC_OR_DEPARTMENT_OTHER): Payer: Self-pay

## 2024-06-01 ENCOUNTER — Ambulatory Visit (HOSPITAL_BASED_OUTPATIENT_CLINIC_OR_DEPARTMENT_OTHER)
Admission: RE | Admit: 2024-06-01 | Discharge: 2024-06-01 | Disposition: A | Discharge status: Home / Self Care | Source: Ambulatory Visit | Attending: General Surgery | Admitting: General Surgery

## 2024-06-01 ENCOUNTER — Other Ambulatory Visit: Payer: Self-pay

## 2024-06-01 DIAGNOSIS — R1030 Lower abdominal pain, unspecified: Secondary | ICD-10-CM | POA: Insufficient documentation

## 2024-06-01 DIAGNOSIS — Z8719 Personal history of other diseases of the digestive system: Secondary | ICD-10-CM | POA: Diagnosis not present

## 2024-06-01 DIAGNOSIS — Z9889 Other specified postprocedural states: Secondary | ICD-10-CM | POA: Insufficient documentation

## 2024-06-01 DIAGNOSIS — K769 Liver disease, unspecified: Secondary | ICD-10-CM | POA: Diagnosis not present

## 2024-06-01 DIAGNOSIS — R188 Other ascites: Secondary | ICD-10-CM | POA: Diagnosis not present

## 2024-06-01 MED ORDER — ZOLPIDEM TARTRATE 5 MG PO TABS
5.0000 mg | ORAL_TABLET | Freq: Every evening | ORAL | 1 refills | Status: AC | PRN
  Filled 2024-06-01: qty 45, 22d supply, fill #0
  Filled 2024-08-04: qty 45, 22d supply, fill #1

## 2024-06-01 MED ORDER — IOHEXOL 300 MG/ML  SOLN
100.0000 mL | Freq: Once | INTRAMUSCULAR | Status: AC | PRN
  Administered 2024-06-01: 100 mL via INTRAVENOUS

## 2024-06-11 DIAGNOSIS — E785 Hyperlipidemia, unspecified: Secondary | ICD-10-CM | POA: Diagnosis not present

## 2024-06-11 DIAGNOSIS — Z0189 Encounter for other specified special examinations: Secondary | ICD-10-CM | POA: Diagnosis not present

## 2024-06-11 DIAGNOSIS — R739 Hyperglycemia, unspecified: Secondary | ICD-10-CM | POA: Diagnosis not present

## 2024-06-11 DIAGNOSIS — M8580 Other specified disorders of bone density and structure, unspecified site: Secondary | ICD-10-CM | POA: Diagnosis not present

## 2024-06-11 DIAGNOSIS — K219 Gastro-esophageal reflux disease without esophagitis: Secondary | ICD-10-CM | POA: Diagnosis not present

## 2024-06-17 DIAGNOSIS — Z4889 Encounter for other specified surgical aftercare: Secondary | ICD-10-CM | POA: Diagnosis not present

## 2024-06-19 DIAGNOSIS — Z0000 Encounter for general adult medical examination without abnormal findings: Secondary | ICD-10-CM | POA: Diagnosis not present

## 2024-06-19 DIAGNOSIS — R3915 Urgency of urination: Secondary | ICD-10-CM | POA: Diagnosis not present

## 2024-06-19 DIAGNOSIS — E785 Hyperlipidemia, unspecified: Secondary | ICD-10-CM | POA: Diagnosis not present

## 2024-06-19 DIAGNOSIS — M8580 Other specified disorders of bone density and structure, unspecified site: Secondary | ICD-10-CM | POA: Diagnosis not present

## 2024-06-19 DIAGNOSIS — T1490XD Injury, unspecified, subsequent encounter: Secondary | ICD-10-CM | POA: Diagnosis not present

## 2024-06-19 DIAGNOSIS — Z23 Encounter for immunization: Secondary | ICD-10-CM | POA: Diagnosis not present

## 2024-06-19 DIAGNOSIS — R82998 Other abnormal findings in urine: Secondary | ICD-10-CM | POA: Diagnosis not present

## 2024-06-19 DIAGNOSIS — E871 Hypo-osmolality and hyponatremia: Secondary | ICD-10-CM | POA: Diagnosis not present

## 2024-06-19 DIAGNOSIS — F418 Other specified anxiety disorders: Secondary | ICD-10-CM | POA: Diagnosis not present

## 2024-06-19 DIAGNOSIS — G4700 Insomnia, unspecified: Secondary | ICD-10-CM | POA: Diagnosis not present

## 2024-06-19 DIAGNOSIS — M1990 Unspecified osteoarthritis, unspecified site: Secondary | ICD-10-CM | POA: Diagnosis not present

## 2024-06-19 DIAGNOSIS — F325 Major depressive disorder, single episode, in full remission: Secondary | ICD-10-CM | POA: Diagnosis not present

## 2024-06-19 DIAGNOSIS — B351 Tinea unguium: Secondary | ICD-10-CM | POA: Diagnosis not present

## 2024-06-19 DIAGNOSIS — Z853 Personal history of malignant neoplasm of breast: Secondary | ICD-10-CM | POA: Diagnosis not present

## 2024-06-19 DIAGNOSIS — Z1389 Encounter for screening for other disorder: Secondary | ICD-10-CM | POA: Diagnosis not present

## 2024-06-19 DIAGNOSIS — Z1331 Encounter for screening for depression: Secondary | ICD-10-CM | POA: Diagnosis not present

## 2024-06-25 ENCOUNTER — Encounter (HOSPITAL_COMMUNITY): Payer: Self-pay | Admitting: General Surgery

## 2024-06-25 ENCOUNTER — Other Ambulatory Visit (HOSPITAL_COMMUNITY): Payer: Self-pay | Admitting: General Surgery

## 2024-06-25 DIAGNOSIS — S3011XD Contusion of abdominal wall, subsequent encounter: Secondary | ICD-10-CM

## 2024-06-26 NOTE — Progress Notes (Unsigned)
 Mugweru, Thom, MD  Jillian Norton, RT PROCEDURE / BIOPSY REVIEW Date: 06/26/24  Requested Biopsy site: LLQ Reason for request: Abd wall fluid collection Imaging review: Best seen on CT AP  Decision: Approved Imaging modality to perform: Ultrasound Schedule with: No sedation / Local anesthetic Schedule for: Any VIR  Additional comments: @VIR : Small LLQ ventral abd wall SQ fluid collection, approx vol <3 mL @Schedulers . US  LLQ abd wall fluid collection DX aspiration. Local anesthetic.  Please contact me with questions, concerns, or if issue pertaining to this request arise.  Jillian Norton Hall, MD [facility]       Previous Messages    ----- Message ----- From: Jillian Norton, RT Sent: 06/25/2024  10:54 AM EDT To: Ir Procedure Requests Subject: US  FINE NEEDLE ASP 1ST LESION                  Procedure : US  FINE NEEDLE ASP 1ST LESION  Reason : abdominal wall seroma, US  Guided aspiration of abdominal wall fluid collection Dx: Abdominal wall seroma, subsequent encounter [S30.11XD (ICD-10-CM)]    History :CT ABDOMEN PELVIS W CONTRAST (Accession [id number]) (Order [id number]), US  FNA SOFT TISSUE (Accession [id number]) (Order [id number]), CT PELVIS WO CONTRAST (Accession [id number]) (Order [id number]), CT ABDOMEN PELVIS W CONTRAST (Accession [id number]) (Order [id number]), DG Abd Portable 1V (Accession [id number]) (Order [id number]), CT CYSTOGRAM PELVIS (Accession [id number]) (Order [id number]), CT ABDOMEN PELVIS W CONTRAST (Accession [id number]) (Order [id number]), CT ABDOMEN PELVIS WO CONTRAST (Accession [id number]) (Order [id number]), DG Abd Portable 1V (Accession [id number]) (Order [id number]), DG Abd Portable 1V (Accession [id number]) (Order [id number]), CT ABDOMEN PELVIS W CONTRAST (Accession [id number]) (Order [id number]),    Provider: Ann Fine, MD  Provider contact ;  [phone]

## 2024-07-02 NOTE — Progress Notes (Signed)
 Patient for US  guided abd wall aspiration on Friday 07/03/24, I called and spoke with the patient on the phone and gave pre-procedure instructions. Pt was made aware to be here at 12:30p and check in at the [facility] registration desk. Pt stated understanding. Called 06/30/24

## 2024-07-03 ENCOUNTER — Ambulatory Visit
Admission: RE | Admit: 2024-07-03 | Discharge: 2024-07-03 | Disposition: A | Discharge status: Home / Self Care | Source: Ambulatory Visit | Attending: General Surgery | Admitting: General Surgery

## 2024-07-03 ENCOUNTER — Other Ambulatory Visit (HOSPITAL_BASED_OUTPATIENT_CLINIC_OR_DEPARTMENT_OTHER): Payer: Self-pay

## 2024-07-03 DIAGNOSIS — T8141XA Infection following a procedure, superficial incisional surgical site, initial encounter: Secondary | ICD-10-CM | POA: Diagnosis not present

## 2024-07-03 DIAGNOSIS — R188 Other ascites: Secondary | ICD-10-CM | POA: Diagnosis not present

## 2024-07-03 DIAGNOSIS — S3011XD Contusion of abdominal wall, subsequent encounter: Secondary | ICD-10-CM

## 2024-07-03 MED ORDER — LIDOCAINE HCL (PF) 1 % IJ SOLN
10.0000 mL | Freq: Once | INTRAMUSCULAR | Status: AC
  Administered 2024-07-03: 10 mL via INTRADERMAL

## 2024-07-03 NOTE — Procedures (Signed)
 Interventional Radiology Procedure Note  Procedure: US  aspiration of abdominal wall  Complications: None  Estimated Blood Loss: < 10 mL  Findings: No obvious organized collection.  Possible hematoma, small amount of fluid aspirated.  Jillian DELENA Banner, MD

## 2024-07-06 DIAGNOSIS — Z4889 Encounter for other specified surgical aftercare: Secondary | ICD-10-CM | POA: Diagnosis not present

## 2024-07-06 DIAGNOSIS — Z8719 Personal history of other diseases of the digestive system: Secondary | ICD-10-CM | POA: Diagnosis not present

## 2024-07-08 LAB — AEROBIC/ANAEROBIC CULTURE W GRAM STAIN (SURGICAL/DEEP WOUND)
Culture: NO GROWTH
Gram Stain: NONE SEEN

## 2024-07-09 ENCOUNTER — Other Ambulatory Visit (HOSPITAL_BASED_OUTPATIENT_CLINIC_OR_DEPARTMENT_OTHER): Payer: Self-pay

## 2024-07-09 MED ORDER — BUPROPION HCL ER (XL) 300 MG PO TB24
300.0000 mg | ORAL_TABLET | Freq: Every day | ORAL | 3 refills | Status: AC
  Filled 2024-07-09: qty 90, 90d supply, fill #0

## 2024-07-22 DIAGNOSIS — S3729XA Other injury of bladder, initial encounter: Secondary | ICD-10-CM | POA: Diagnosis not present

## 2024-07-22 DIAGNOSIS — R338 Other retention of urine: Secondary | ICD-10-CM | POA: Diagnosis not present

## 2024-07-22 DIAGNOSIS — R188 Other ascites: Secondary | ICD-10-CM | POA: Diagnosis not present

## 2024-08-04 ENCOUNTER — Other Ambulatory Visit (HOSPITAL_BASED_OUTPATIENT_CLINIC_OR_DEPARTMENT_OTHER): Payer: Self-pay

## 2024-08-04 MED ORDER — ZOLPIDEM TARTRATE 10 MG PO TABS
10.0000 mg | ORAL_TABLET | Freq: Every evening | ORAL | 1 refills | Status: AC | PRN
  Filled 2024-08-04: qty 30, 30d supply, fill #0

## 2024-08-24 ENCOUNTER — Other Ambulatory Visit (HOSPITAL_BASED_OUTPATIENT_CLINIC_OR_DEPARTMENT_OTHER): Payer: Self-pay

## 2024-08-31 ENCOUNTER — Other Ambulatory Visit (HOSPITAL_BASED_OUTPATIENT_CLINIC_OR_DEPARTMENT_OTHER): Payer: Self-pay

## 2024-08-31 MED ORDER — BUPROPION HCL ER (XL) 300 MG PO TB24
300.0000 mg | ORAL_TABLET | Freq: Every morning | ORAL | 3 refills | Status: AC
  Filled 2024-08-31: qty 90, 90d supply, fill #0

## 2024-08-31 MED ORDER — TRAMADOL HCL 50 MG PO TABS
50.0000 mg | ORAL_TABLET | Freq: Four times a day (QID) | ORAL | 0 refills | Status: AC
  Filled 2024-08-31: qty 20, 5d supply, fill #0

## 2024-09-16 NOTE — Progress Notes (Signed)
" ° °  PROVIDER:  RICHERD SILVERSMITH, MD  MRN: [id number] DOB: 1941-08-16 DATE OF ENCOUNTER: 09/16/2024 Interval History:     Briefly, patient is an 84 yo F with history of left incarcerated femoral hernia s/p hernia repair and small bowel resection on 7/8. Returned with swelling and progressive pain to [facility] on 7/23 and was found to have extraperitoneal bladder rupture and taken to OR for repair with Dr. Selma on 03/19/24. She was discharged on 03/24/24. In follow up there was concern for redness and pain at surgical site and patient did have fluid collection which was aspirated with IR and cultures were all negative. Given persistent erythema to area was given course of empiric antibiotics and had improvement initially but then called to say persistent drainage. CT with fluid collection. She has undergone repeat aspirations, no evidence of infection or urinary leak.  Returns today for long term follow up for wound check. States still has occasional thin weeping from scar but does not cause pain, does not limit her in any way and she can carry out all physical activities without issues. No fevers/chills or signs of infection. No spreading erythema     Objective  Physical Exam Gen: NAD CV: RRR Pulm: NWOB Abd: Soft, NTND, left inguinal surgical scar without any obvious opening or drainage but one area of very thin skin in area where patients states occasional drainage. No erythema   Assessment and Plan:     GAO MITNICK is a 84 y.o. female who is s/p left femoral hernia repair with SBR on 7/8 complicated by extraperitoneal bladder injury s/p cystorrhaphy on 7/24 with Dr. Selma who presents for wound check   Diagnoses and all orders for this visit:  History of femoral hernia repair  Encounter for post surgical wound check     - Silver nitrate applied empirically to area where patient states occasional drainage from - Precautions given to keep covered to protect clothing given staining potential. - If  she develops any pain or area begins to impede her lifestyle she will call to be reevaluated. Otherwise can follow up PRN--patient is happy with this plan and does not feel need to continue to follow up at this time.    RICHERD SILVERSMITH, MD   "

## 2024-09-30 ENCOUNTER — Other Ambulatory Visit (HOSPITAL_BASED_OUTPATIENT_CLINIC_OR_DEPARTMENT_OTHER): Payer: Self-pay

## 2024-09-30 MED ORDER — OMEPRAZOLE 40 MG PO CPDR
40.0000 mg | DELAYED_RELEASE_CAPSULE | Freq: Every day | ORAL | 3 refills | Status: AC
  Filled 2024-09-30: qty 90, 90d supply, fill #0
